# Patient Record
Sex: Female | Born: 1965 | Race: White | Hispanic: No | Marital: Married | Smoking: Never smoker
Health system: Southern US, Community
[De-identification: ages and names within clinical notes are randomized; demographics above are authoritative.]

## PROBLEM LIST (undated history)

## (undated) ENCOUNTER — Emergency Department (HOSPITAL_COMMUNITY): Discharge status: Absent without leave | Payer: BC Managed Care – PPO

## (undated) DIAGNOSIS — J45909 Unspecified asthma, uncomplicated: Secondary | ICD-10-CM

## (undated) DIAGNOSIS — F329 Major depressive disorder, single episode, unspecified: Secondary | ICD-10-CM

## (undated) DIAGNOSIS — Z87442 Personal history of urinary calculi: Secondary | ICD-10-CM

## (undated) DIAGNOSIS — E041 Nontoxic single thyroid nodule: Secondary | ICD-10-CM

## (undated) DIAGNOSIS — G43909 Migraine, unspecified, not intractable, without status migrainosus: Secondary | ICD-10-CM

## (undated) DIAGNOSIS — K219 Gastro-esophageal reflux disease without esophagitis: Secondary | ICD-10-CM

## (undated) DIAGNOSIS — F419 Anxiety disorder, unspecified: Secondary | ICD-10-CM

## (undated) DIAGNOSIS — R112 Nausea with vomiting, unspecified: Secondary | ICD-10-CM

## (undated) DIAGNOSIS — D320 Benign neoplasm of cerebral meninges: Secondary | ICD-10-CM

## (undated) DIAGNOSIS — Z9889 Other specified postprocedural states: Secondary | ICD-10-CM

## (undated) DIAGNOSIS — H9190 Unspecified hearing loss, unspecified ear: Secondary | ICD-10-CM

## (undated) DIAGNOSIS — J309 Allergic rhinitis, unspecified: Secondary | ICD-10-CM

## (undated) DIAGNOSIS — F32A Depression, unspecified: Secondary | ICD-10-CM

## (undated) DIAGNOSIS — D649 Anemia, unspecified: Secondary | ICD-10-CM

## (undated) HISTORY — DX: Gastro-esophageal reflux disease without esophagitis: K21.9

## (undated) HISTORY — DX: Nontoxic single thyroid nodule: E04.1

## (undated) HISTORY — DX: Personal history of urinary calculi: Z87.442

## (undated) HISTORY — DX: Benign neoplasm of cerebral meninges: D32.0

## (undated) HISTORY — PX: WISDOM TOOTH EXTRACTION: SHX21

## (undated) HISTORY — DX: Allergic rhinitis, unspecified: J30.9

## (undated) HISTORY — DX: Migraine, unspecified, not intractable, without status migrainosus: G43.909

## (undated) HISTORY — DX: Anemia, unspecified: D64.9

---

## 1998-05-12 ENCOUNTER — Other Ambulatory Visit: Admission: RE | Admit: 1998-05-12 | Discharge: 1998-05-12 | Payer: Self-pay | Admitting: Obstetrics and Gynecology

## 1998-08-05 ENCOUNTER — Inpatient Hospital Stay (HOSPITAL_COMMUNITY): Admission: AD | Admit: 1998-08-05 | Discharge: 1998-08-05 | Payer: Self-pay | Admitting: Obstetrics and Gynecology

## 1998-10-31 ENCOUNTER — Inpatient Hospital Stay (HOSPITAL_COMMUNITY): Admission: AD | Admit: 1998-10-31 | Discharge: 1998-10-31 | Payer: Self-pay | Admitting: Obstetrics and Gynecology

## 1998-11-04 ENCOUNTER — Inpatient Hospital Stay (HOSPITAL_COMMUNITY): Admission: AD | Admit: 1998-11-04 | Discharge: 1998-11-12 | Payer: Self-pay | Admitting: Obstetrics & Gynecology

## 1998-11-05 ENCOUNTER — Encounter: Payer: Self-pay | Admitting: Obstetrics and Gynecology

## 1998-11-05 ENCOUNTER — Encounter: Payer: Self-pay | Admitting: Obstetrics & Gynecology

## 1998-12-22 ENCOUNTER — Other Ambulatory Visit: Admission: RE | Admit: 1998-12-22 | Discharge: 1998-12-22 | Payer: Self-pay | Admitting: Obstetrics and Gynecology

## 2000-02-09 ENCOUNTER — Other Ambulatory Visit: Admission: RE | Admit: 2000-02-09 | Discharge: 2000-02-09 | Payer: Self-pay | Admitting: Obstetrics and Gynecology

## 2001-02-21 ENCOUNTER — Other Ambulatory Visit: Admission: RE | Admit: 2001-02-21 | Discharge: 2001-02-21 | Payer: Self-pay | Admitting: Obstetrics and Gynecology

## 2001-04-24 ENCOUNTER — Encounter: Payer: Self-pay | Admitting: Otolaryngology

## 2001-04-24 ENCOUNTER — Encounter: Admission: RE | Admit: 2001-04-24 | Discharge: 2001-04-24 | Payer: Self-pay | Admitting: Otolaryngology

## 2002-02-23 ENCOUNTER — Encounter: Payer: Self-pay | Admitting: Otolaryngology

## 2002-02-23 ENCOUNTER — Encounter: Admission: RE | Admit: 2002-02-23 | Discharge: 2002-02-23 | Payer: Self-pay | Admitting: Otolaryngology

## 2002-02-27 ENCOUNTER — Ambulatory Visit (HOSPITAL_COMMUNITY): Admission: RE | Admit: 2002-02-27 | Discharge: 2002-02-27 | Payer: Self-pay | Admitting: Otolaryngology

## 2002-02-27 ENCOUNTER — Encounter (INDEPENDENT_AMBULATORY_CARE_PROVIDER_SITE_OTHER): Payer: Self-pay | Admitting: Specialist

## 2002-02-27 ENCOUNTER — Encounter: Payer: Self-pay | Admitting: Otolaryngology

## 2002-03-12 ENCOUNTER — Other Ambulatory Visit: Admission: RE | Admit: 2002-03-12 | Discharge: 2002-03-12 | Payer: Self-pay | Admitting: Obstetrics and Gynecology

## 2002-07-20 ENCOUNTER — Encounter: Admission: RE | Admit: 2002-07-20 | Discharge: 2002-07-20 | Payer: Self-pay | Admitting: Otolaryngology

## 2002-07-20 ENCOUNTER — Encounter: Payer: Self-pay | Admitting: Otolaryngology

## 2002-10-29 ENCOUNTER — Ambulatory Visit (HOSPITAL_COMMUNITY): Admission: RE | Admit: 2002-10-29 | Discharge: 2002-10-29 | Payer: Self-pay | Admitting: *Deleted

## 2002-10-29 ENCOUNTER — Encounter: Payer: Self-pay | Admitting: *Deleted

## 2002-12-06 ENCOUNTER — Encounter: Payer: Self-pay | Admitting: Otolaryngology

## 2002-12-06 ENCOUNTER — Encounter: Admission: RE | Admit: 2002-12-06 | Discharge: 2002-12-06 | Payer: Self-pay | Admitting: Otolaryngology

## 2002-12-14 ENCOUNTER — Encounter: Payer: Self-pay | Admitting: Obstetrics and Gynecology

## 2002-12-14 ENCOUNTER — Inpatient Hospital Stay (HOSPITAL_COMMUNITY): Admission: AD | Admit: 2002-12-14 | Discharge: 2002-12-14 | Payer: Self-pay | Admitting: Obstetrics and Gynecology

## 2003-01-02 ENCOUNTER — Inpatient Hospital Stay (HOSPITAL_COMMUNITY): Admission: AD | Admit: 2003-01-02 | Discharge: 2003-01-06 | Payer: Self-pay | Admitting: Obstetrics and Gynecology

## 2003-02-19 ENCOUNTER — Other Ambulatory Visit: Admission: RE | Admit: 2003-02-19 | Discharge: 2003-02-19 | Payer: Self-pay | Admitting: Obstetrics and Gynecology

## 2004-01-27 ENCOUNTER — Encounter: Admission: RE | Admit: 2004-01-27 | Discharge: 2004-01-27 | Payer: Self-pay | Admitting: Otolaryngology

## 2004-05-05 ENCOUNTER — Other Ambulatory Visit: Admission: RE | Admit: 2004-05-05 | Discharge: 2004-05-05 | Payer: Self-pay | Admitting: Obstetrics and Gynecology

## 2005-03-12 ENCOUNTER — Encounter: Admission: RE | Admit: 2005-03-12 | Discharge: 2005-03-12 | Payer: Self-pay | Admitting: Otolaryngology

## 2005-05-21 ENCOUNTER — Other Ambulatory Visit: Admission: RE | Admit: 2005-05-21 | Discharge: 2005-05-21 | Payer: Self-pay | Admitting: Obstetrics and Gynecology

## 2006-02-14 ENCOUNTER — Encounter: Admission: RE | Admit: 2006-02-14 | Discharge: 2006-02-14 | Payer: Self-pay | Admitting: Otolaryngology

## 2006-04-21 ENCOUNTER — Encounter: Payer: Self-pay | Admitting: Internal Medicine

## 2006-09-02 ENCOUNTER — Encounter: Admission: RE | Admit: 2006-09-02 | Discharge: 2006-09-02 | Payer: Self-pay | Admitting: Otolaryngology

## 2006-09-11 ENCOUNTER — Encounter: Payer: Self-pay | Admitting: Internal Medicine

## 2006-12-29 ENCOUNTER — Encounter: Admission: RE | Admit: 2006-12-29 | Discharge: 2006-12-29 | Payer: Self-pay | Admitting: Otolaryngology

## 2007-06-25 ENCOUNTER — Emergency Department (HOSPITAL_COMMUNITY): Admission: EM | Admit: 2007-06-25 | Discharge: 2007-06-25 | Payer: Self-pay | Admitting: Emergency Medicine

## 2007-06-25 ENCOUNTER — Encounter: Payer: Self-pay | Admitting: Internal Medicine

## 2007-06-25 DIAGNOSIS — Z87442 Personal history of urinary calculi: Secondary | ICD-10-CM

## 2007-06-25 LAB — CONVERTED CEMR LAB
Glucose, Bld: 120 mg/dL
HCT: 43 %
Hemoglobin: 14.6 g/dL
Hgb urine dipstick: NEGATIVE
Ketones, urine, test strip: >80
Potassium: 4 meq/L
Sodium: 138 meq/L
Specific Gravity, Urine: 1.029
Urobilinogen, UA: 1
pH: 6

## 2007-06-29 ENCOUNTER — Encounter: Admission: RE | Admit: 2007-06-29 | Discharge: 2007-06-29 | Payer: Self-pay | Admitting: Otolaryngology

## 2008-12-02 ENCOUNTER — Encounter: Payer: Self-pay | Admitting: Internal Medicine

## 2008-12-10 ENCOUNTER — Encounter: Admission: RE | Admit: 2008-12-10 | Discharge: 2008-12-10 | Payer: Self-pay | Admitting: Otolaryngology

## 2009-05-16 LAB — CONVERTED CEMR LAB

## 2009-05-21 ENCOUNTER — Encounter: Admission: RE | Admit: 2009-05-21 | Discharge: 2009-05-21 | Payer: Self-pay | Admitting: Otolaryngology

## 2009-05-21 ENCOUNTER — Encounter: Payer: Self-pay | Admitting: Internal Medicine

## 2009-07-08 ENCOUNTER — Encounter: Payer: Self-pay | Admitting: Internal Medicine

## 2009-07-08 LAB — CONVERTED CEMR LAB
ALT: 7 units/L
AST: 10 units/L
Alkaline Phosphatase: 53 units/L
CO2: 22 meq/L
Calcium: 9.1 mg/dL
Glucose, Bld: 87 mg/dL
Sodium: 139 meq/L
TSH: 0.007 microintl units/mL
Total Bilirubin: 0.3 mg/dL
WBC: 4.5 10*3/uL

## 2009-07-15 ENCOUNTER — Encounter: Payer: Self-pay | Admitting: Internal Medicine

## 2009-07-15 DIAGNOSIS — J309 Allergic rhinitis, unspecified: Secondary | ICD-10-CM | POA: Insufficient documentation

## 2009-07-15 DIAGNOSIS — K219 Gastro-esophageal reflux disease without esophagitis: Secondary | ICD-10-CM

## 2009-07-16 ENCOUNTER — Ambulatory Visit: Payer: Self-pay | Admitting: Internal Medicine

## 2009-07-16 DIAGNOSIS — G43909 Migraine, unspecified, not intractable, without status migrainosus: Secondary | ICD-10-CM | POA: Insufficient documentation

## 2009-07-16 DIAGNOSIS — R519 Headache, unspecified: Secondary | ICD-10-CM | POA: Insufficient documentation

## 2009-07-16 DIAGNOSIS — E0590 Thyrotoxicosis, unspecified without thyrotoxic crisis or storm: Secondary | ICD-10-CM | POA: Insufficient documentation

## 2009-07-16 DIAGNOSIS — E041 Nontoxic single thyroid nodule: Secondary | ICD-10-CM | POA: Insufficient documentation

## 2009-07-16 DIAGNOSIS — R51 Headache: Secondary | ICD-10-CM

## 2009-07-17 ENCOUNTER — Telehealth (INDEPENDENT_AMBULATORY_CARE_PROVIDER_SITE_OTHER): Payer: Self-pay | Admitting: *Deleted

## 2009-07-18 ENCOUNTER — Encounter: Payer: Self-pay | Admitting: Internal Medicine

## 2009-07-25 ENCOUNTER — Encounter (INDEPENDENT_AMBULATORY_CARE_PROVIDER_SITE_OTHER): Payer: Self-pay | Admitting: Neurosurgery

## 2009-07-25 ENCOUNTER — Inpatient Hospital Stay (HOSPITAL_COMMUNITY): Admission: RE | Admit: 2009-07-25 | Discharge: 2009-07-29 | Payer: Self-pay | Admitting: Neurosurgery

## 2009-07-25 HISTORY — PX: OTHER SURGICAL HISTORY: SHX169

## 2009-08-04 ENCOUNTER — Encounter: Payer: Self-pay | Admitting: Internal Medicine

## 2009-08-05 ENCOUNTER — Ambulatory Visit: Payer: Self-pay | Admitting: Endocrinology

## 2009-08-05 DIAGNOSIS — D320 Benign neoplasm of cerebral meninges: Secondary | ICD-10-CM | POA: Insufficient documentation

## 2009-08-05 LAB — CONVERTED CEMR LAB
CO2: 30 meq/L (ref 19–32)
Chloride: 104 meq/L (ref 96–112)
Creatinine, Ser: 0.7 mg/dL (ref 0.4–1.2)
Free T4: 0.9 ng/dL (ref 0.6–1.6)
Potassium: 4.1 meq/L (ref 3.5–5.1)
Sodium: 141 meq/L (ref 135–145)
TSH: 0.73 microintl units/mL (ref 0.35–5.50)

## 2009-10-29 ENCOUNTER — Encounter: Payer: Self-pay | Admitting: Internal Medicine

## 2010-02-13 LAB — CONVERTED CEMR LAB

## 2010-02-13 LAB — HM MAMMOGRAPHY

## 2010-04-22 ENCOUNTER — Encounter: Payer: Self-pay | Admitting: Internal Medicine

## 2010-07-08 ENCOUNTER — Ambulatory Visit: Payer: Self-pay | Admitting: Internal Medicine

## 2010-07-11 LAB — CONVERTED CEMR LAB
AST: 15 units/L (ref 0–37)
Albumin: 4.1 g/dL (ref 3.5–5.2)
Alkaline Phosphatase: 49 units/L (ref 39–117)
Basophils Relative: 0.7 % (ref 0.0–3.0)
CO2: 29 meq/L (ref 19–32)
Calcium: 8.9 mg/dL (ref 8.4–10.5)
Direct LDL: 132.4 mg/dL
Glucose, Bld: 89 mg/dL (ref 70–99)
HCT: 39.8 % (ref 36.0–46.0)
Hemoglobin: 13.6 g/dL (ref 12.0–15.0)
Lymphocytes Relative: 28.6 % (ref 12.0–46.0)
Lymphs Abs: 1.9 10*3/uL (ref 0.7–4.0)
MCHC: 34.2 g/dL (ref 30.0–36.0)
Monocytes Relative: 6.8 % (ref 3.0–12.0)
Neutro Abs: 4.1 10*3/uL (ref 1.4–7.7)
Nitrite: NEGATIVE
Potassium: 4.3 meq/L (ref 3.5–5.1)
RBC: 4.39 M/uL (ref 3.87–5.11)
Sodium: 137 meq/L (ref 135–145)
Specific Gravity, Urine: 1.01 (ref 1.000–1.030)
TSH: 0.36 microintl units/mL (ref 0.35–5.50)
Total Protein, Urine: NEGATIVE mg/dL
Total Protein: 6.3 g/dL (ref 6.0–8.3)
Urine Glucose: NEGATIVE mg/dL
pH: 7.5 (ref 5.0–8.0)

## 2010-07-17 ENCOUNTER — Ambulatory Visit: Payer: Self-pay | Admitting: Internal Medicine

## 2010-07-17 ENCOUNTER — Encounter: Payer: Self-pay | Admitting: Internal Medicine

## 2010-08-20 ENCOUNTER — Ambulatory Visit
Admission: RE | Admit: 2010-08-20 | Discharge: 2010-08-20 | Payer: Self-pay | Source: Home / Self Care | Attending: Endocrinology | Admitting: Endocrinology

## 2010-08-26 ENCOUNTER — Telehealth: Payer: Self-pay | Admitting: Endocrinology

## 2010-09-05 ENCOUNTER — Other Ambulatory Visit: Payer: Self-pay | Admitting: Endocrinology

## 2010-09-05 DIAGNOSIS — E041 Nontoxic single thyroid nodule: Secondary | ICD-10-CM

## 2010-09-06 ENCOUNTER — Encounter: Payer: Self-pay | Admitting: Otolaryngology

## 2010-09-15 NOTE — Letter (Signed)
Summary: [facility]  [facility]   Imported By: Sherian Rein 08/22/2009 14:11:48  _____________________________________________________________________  External Attachment:    Type:   Image     Comment:   External Document

## 2010-09-15 NOTE — Assessment & Plan Note (Signed)
Summary: NEW/[employer]/#/CD   Vital Signs:  Patient profile:   45 year old female Height:      63 inches (160.02 cm) Weight:      110 pounds (50.00 kg) BMI:     19.56 O2 Sat:      99 % on Room air Temp:     99.1 degrees F (37.28 degrees C) oral Pulse rate:   94 / minute BP sitting:   102 / 68  (left arm) Cuff size:   regular  Vitals Entered By: Orlan Leavens RMA (July 17, 2010 3:20 PM)  O2 Flow:  Room air CC: cox Is Patient Diabetic? No Pain Assessment Patient in pain? no        Primary Care Provider:  Newt Lukes MD  CC:  cox.  History of Present Illness: patient is here today for annual physical. Patient feels well and has no complaints.   Preventive Screening-Counseling & Management  Alcohol-Tobacco     Alcohol drinks/day: 0     Alcohol Counseling: not indicated; patient does not drink     Smoking Status: never     Tobacco Counseling: not indicated; no tobacco use  Caffeine-Diet-Exercise     Caffeine Counseling: decrease use of caffeine     Does Patient Exercise: no  Safety-Violence-Falls     Seat Belt Counseling: not indicated; patient wears seat belts     Violence Counseling: not indicated; no violence risk noted  Clinical Review Panels:  Prevention   Last Mammogram:  No specific mammographic evidence of malignancy.   (02/13/2010)   Last Pap Smear:  Interpretation Result:Negative for intraepithelial Lesion or Malignancy.    (02/13/2010)  Lipid Management   Cholesterol:  202 (07/08/2010)   HDL (good cholesterol):  49.60 (07/08/2010)  CBC   WBC:  6.8 (07/08/2010)   RBC:  4.39 (07/08/2010)   Hgb:  13.6 (07/08/2010)   Hct:  39.8 (07/08/2010)   Platelets:  276.0 (07/08/2010)   MCV  90.5 (07/08/2010)   MCHC  34.2 (07/08/2010)   RDW  12.3 (07/08/2010)   PMN:  60.0 (07/08/2010)   Lymphs:  28.6 (07/08/2010)   Monos:  6.8 (07/08/2010)   Eosinophils:  3.9 (07/08/2010)   Basophil:  0.7 (07/08/2010)  Complete Metabolic Panel   Glucose:  89  (07/08/2010)   Sodium:  137 (07/08/2010)   Potassium:  4.3 (07/08/2010)   Chloride:  103 (07/08/2010)   CO2:  29 (07/08/2010)   BUN:  12 (07/08/2010)   Creatinine:  0.6 (07/08/2010)   Albumin:  4.1 (07/08/2010)   Total Protein:  6.3 (07/08/2010)   Calcium:  8.9 (07/08/2010)   Total Bili:  0.6 (07/08/2010)   Alk Phos:  49 (07/08/2010)   SGPT (ALT):  12 (07/08/2010)   SGOT (AST):  15 (07/08/2010)   Current Medications (verified): 1)  Claritin 10 Mg Tabs (Loratadine) .... Take 1 By Mouth Once Daily Prn 2)  Levothyroxine Sodium 50 Mcg Tabs (Levothyroxine Sodium) .Marland Kitchen.. 1 Qd  Allergies (verified): 1)  ! Levaquin  Past History:  Past medical, surgical, family and social histories (including risk factors) reviewed, and no changes noted (except as noted below).  Past Medical History: Allergic rhinitis GERD Hypothyroidism? thyroid nodule  MD roster - ENT-[facility] neuro-Yan gyn- [facility] N[facility] - stern endo - ellison  Past Surgical History: Caesarean section x's 2 meninogioma rescetion 07/25/2009  Family History: Reviewed history from 07/16/2009 and no changes required. Family History Diabetes 1st degree relative (grandparent) Family History High cholesterol (parent) Stoke (grandparent) Family hx  of ulcerative colitis  Social History: Reviewed history from 07/16/2009 and no changes required. Never Smoked no alcohol married, lives with spouse and 2 kids pt works as Runner, broadcasting/film/video  Review of Systems       see HPI above. I have reviewed all other systems and they were negative.   Physical Exam  General:  thin, alert, well-developed, well-nourished, and cooperative to examination.    Head:  Normocephalic and atraumatic without obvious abnormalities. No apparent alopecia or balding. Eyes:  vision grossly intact; pupils equal, round and reactive to light.  conjunctiva and lids normal.    Ears:  normal pinnae bilaterally, without erythema, swelling, or tenderness to palpation. TMs  clear, without effusion, or cerumen impaction. Hearing grossly normal bilaterally  Mouth:  teeth and gums in good repair; mucous membranes moist, without lesions or ulcers. oropharynx clear without exudate, no erythema.  Neck:  No deformities, masses, or tenderness noted. Lungs:  normal respiratory effort, no intercostal retractions or use of accessory muscles; normal breath sounds bilaterally - no crackles and no wheezes.    Heart:  normal rate, regular rhythm, no murmur, and no rub. BLE without edema. Abdomen:  soft, non-tender, normal bowel sounds, no distention; no masses and no appreciable hepatomegaly or splenomegaly.   Genitalia:  defer to gyn Msk:  No deformity or scoliosis noted of thoracic or lumbar spine.   Neurologic:  alert & oriented X3 and cranial nerves II-XII symetrically intact.  strength normal in all extremities, sensation intact to light touch, and gait normal. speech fluent without dysarthria or aphasia; follows commands with good comprehension.  Skin:  no rashes, vesicles, ulcers, or erythema. No nodules or irregularity to palpation.  Psych:  Oriented X3, memory intact for recent and remote, normally interactive, good eye contact, not anxious appearing, not depressed appearing, and not agitated.      Impression & Recommendations:  Problem # 1:  PREVENTIVE HEALTH CARE (ICD-V70.0) Patient has been counseled on age-appropriate routine health concerns for screening and prevention. These are reviewed and up-to-date. Immunizations are up-to-date or declined. Labs and ECG reviewed.  Orders: EKG w/ Interpretation (93000)  Complete Medication List: 1)  Claritin 10 Mg Tabs (Loratadine) .... Take 1 by mouth once daily prn 2)  Levothyroxine Sodium 50 Mcg Tabs (Levothyroxine sodium) .Marland Kitchen.. 1 qd  Patient Instructions: 1)  it was good to see you today. 2)  exam, labs and EKG look good 3)  Recommend Vitamin D 1000-2000 units daily -  4)  no other medications changes 5)  Please  schedule a follow-up appointment in annually for your medical physical and labs, call sooner if problems.    Orders Added: 1)  EKG w/ Interpretation [93000] 2)  Est. Patient 40-64 years [99396]     Mammogram  Procedure date:  02/13/2010  Findings:      No specific mammographic evidence of malignancy.    Pap Smear  Procedure date:  02/13/2010  Findings:      Interpretation Result:Negative for intraepithelial Lesion or Malignancy.

## 2010-09-15 NOTE — Letter (Signed)
Summary: [facility]  [facility]   Imported By: Sherian Rein 08/22/2009 14:12:58  _____________________________________________________________________  External Attachment:    Type:   Image     Comment:   External Document

## 2010-09-15 NOTE — Letter (Signed)
Summary: [facility]  [facility]   Imported By: Sherian Rein 08/22/2009 14:10:25  _____________________________________________________________________  External Attachment:    Type:   Image     Comment:   External Document

## 2010-09-15 NOTE — Letter (Signed)
Summary: [facility]  [facility]   Imported By: Sherian Rein 08/22/2009 14:15:06  _____________________________________________________________________  External Attachment:    Type:   Image     Comment:   External Document

## 2010-09-15 NOTE — Letter (Signed)
Summary: [facility]  [facility]   Imported By: Lester [place] 11/27/2009 11:25:13  _____________________________________________________________________  External Attachment:    Type:   Image     Comment:   External Document

## 2010-09-15 NOTE — Letter (Signed)
Summary: [facility]  [facility]   Imported By: Lester [place] 05/12/2010 12:44:50  _____________________________________________________________________  External Attachment:    Type:   Image     Comment:   External Document

## 2010-09-17 NOTE — Progress Notes (Signed)
Summary: DX CODE  ---- Converted from flag ---- ---- 08/24/2010 7:04 PM, Minus Breeding MD wrote: 241.0  ---- 08/24/2010 4:20 PM, Hilarie Fredrickson wrote: What is the dx code for the tsh? ------------------------------

## 2010-09-17 NOTE — Assessment & Plan Note (Signed)
Summary: F/U APPT/CD   Vital Signs:  Patient profile:   45 year old female Height:      63 inches (160.02 cm) Weight:      109.38 pounds (49.72 kg) BMI:     19.45 O2 Sat:      98 % on Room air Temp:     99.1 degrees F (37.28 degrees C) oral Pulse rate:   80 / minute Pulse rhythm:   regular BP sitting:   106 / 74  (left arm) Cuff size:   regular  Vitals Entered By: Brenton Grills CMA Duncan Dull) (August 20, 2010 4:03 PM)  O2 Flow:  Room air CC: Follow up on thyroid/aj Is Patient Diabetic? No   Referring Provider:  leschber Primary Provider:  Newt Lukes MD  CC:  Follow up on thyroid/aj.  History of Present Illness: pt says she feels slightly lightheaded recently.  several children were ill a few mos ago, but they are better now.  she says the temp of 99.1 is high for her.  she has regular menses.  she does not appreciate any change in the thyroid nodule.  pt says she was never noted to have hypothyroidism--the synthroid was only for suppression of the nodule.  Current Medications (verified): 1)  Claritin 10 Mg Tabs (Loratadine) .... Take 1 By Mouth Once Daily Prn 2)  Levothyroxine Sodium 50 Mcg Tabs (Levothyroxine Sodium) .Marland Kitchen.. 1 Qd 3)  Vitamin D 400 Unit Caps (Cholecalciferol) .Marland Kitchen.. 1 Capsule By Mouth Once Daily 4)  Multivitamins  Caps (Multiple Vitamin) .Marland Kitchen.. 1 By Mouth Once Daily  Allergies (verified): 1)  ! Levaquin  Past History:  Past Medical History: Last updated: 07/17/2010 Allergic rhinitis GERD Hypothyroidism? thyroid nodule  MD roster - ENT-[facility] neuro-Yan gyn- [facility] - stern endo - Naava Janeway  Review of Systems       denies sore throat/earache/dysuria/dysuria/abd pain  Physical Exam  General:  normal appearance.   Neck:  there is an approx 2 cm left lower pole thyroid nodule.   Impression & Recommendations:  Problem # 1:  THYROID NODULE (ICD-241.0) Assessment Unchanged  Problem # 2:  other general sxs this is not  fever  Medications Added to Medication List This Visit: 1)  Vitamin D 400 Unit Caps (Cholecalciferol) .Marland Kitchen.. 1 capsule by mouth once daily 2)  Multivitamins Caps (Multiple vitamin) .Marland Kitchen.. 1 by mouth once daily  Other Orders: Radiology Referral (Radiology) Est. Patient Level III (16109)  Patient Instructions: 1)  stop levothyroxine, on a trial basis. 2)  in 2 months, go to lab for tsh, and check ultrasound then.  you will be called with a day and time for an appointment. 3)  Please schedule a follow-up appointment in 1 year.   Orders Added: 1)  Radiology Referral [Radiology] 2)  Est. Patient Level III [60454]

## 2010-10-23 ENCOUNTER — Other Ambulatory Visit: Payer: Self-pay

## 2010-11-09 ENCOUNTER — Ambulatory Visit
Admission: RE | Admit: 2010-11-09 | Discharge: 2010-11-09 | Disposition: A | Discharge status: Home / Self Care | Payer: Self-pay | Source: Ambulatory Visit | Attending: Endocrinology | Admitting: Endocrinology

## 2010-11-09 DIAGNOSIS — E041 Nontoxic single thyroid nodule: Secondary | ICD-10-CM

## 2010-11-17 LAB — ABO/RH: ABO/RH(D): O POS

## 2010-11-17 LAB — CBC
Hemoglobin: 16.1 g/dL — ABNORMAL HIGH (ref 12.0–15.0)
RBC: 5.39 MIL/uL — ABNORMAL HIGH (ref 3.87–5.11)
RDW: 13.4 % (ref 11.5–15.5)
WBC: 11 10*3/uL — ABNORMAL HIGH (ref 4.0–10.5)

## 2010-11-17 LAB — BASIC METABOLIC PANEL
BUN: 20 mg/dL (ref 6–23)
GFR calc non Af Amer: >60 mL/min (ref >60)
Potassium: 4.5 mEq/L (ref 3.5–5.1)
Sodium: 137 mEq/L (ref 135–145)

## 2010-11-17 LAB — TYPE AND SCREEN: Antibody Screen: NEGATIVE

## 2010-12-02 ENCOUNTER — Telehealth: Payer: Self-pay

## 2010-12-02 NOTE — Telephone Encounter (Signed)
Pt called requesting results of thryoid U/S

## 2010-12-02 NOTE — Telephone Encounter (Signed)
Pt advised.

## 2010-12-02 NOTE — Telephone Encounter (Signed)
On phone-tree 

## 2011-01-01 NOTE — Op Note (Signed)
NAME:  Deborah Ortega, Deborah Ortega                         ACCOUNT NO.:  [id number]   MEDICAL RECORD NO.:  [id number]                   PATIENT TYPE:  INP   LOCATION:  9104                                 FACILITY:  [facility]   PHYSICIAN:  Juluis Mire, M.D.                DATE OF BIRTH:  04/30/1966   DATE OF PROCEDURE:  01/02/2003  DATE OF DISCHARGE:                                 OPERATIVE REPORT   PREOPERATIVE DIAGNOSES:  1. Intrauterine pregnancy at 38 weeks with prior cesarean section, desires a     repeat.  2. Maternal elevated bile acids.   POSTOPERATIVE DIAGNOSES:  1. Intrauterine pregnancy at 38 weeks with prior cesarean section, desires a     repeat.  2. Maternal elevated bile acids.   PROCEDURE:  Low transverse cesarean section.   SURGEON:  Juluis Mire, M.D.   ANESTHESIA:  Spinal.   ESTIMATED BLOOD LOSS:  600 mL.   PACKS AND DRAINS:  None.   BLOOD REPLACED:  None.   COMPLICATIONS:  None.   INDICATIONS:  Dictated in the history and physical.   DESCRIPTION OF PROCEDURE:  The patient was taken to the operating room and  placed in the supine position with a left lateral tilt.  After a  satisfactory level of spinal anesthesia was obtained, the abdomen was  prepped out with Betadine and draped as a sterile field.  A low transverse  skin incision was identified and excised.  The incision was then extended  through subcutaneous tissue.  The anterior rectus fascia was entered sharply  and the incision in the fascia extended laterally.  The fascia taken off the  muscle superiorly inferiorly.  The rectus muscles were separated in the  midline.  The anterior peritoneum was entered sharply and the incision in  the peritoneum extended both superiorly and inferiorly.  A low transverse  bladder flap was developed and a low transverse uterine incision begun with  a knife and extended laterally using manual traction.  Amniotic fluid was  clear.  The infant was in the vertex  presentation, delivered with elevation  of the head and fundal pressure.  The infant was a viable female who weighed 6  pounds, Apgars were 8 and 9, umbilical artery pH was 7.31.  The placenta was  then delivered manually.  The uterus was wiped free of remaining membranes  and placenta.  The uterus was then closed in a running interlocking suture  of 0 chromic using a two-layer closure technique.  Areas of bleeding brought  under control with figure-of-eights of 0 chromic.  The tubes and ovaries  were visualized, noted to be unremarkable.  We irrigated the pelvis.  Hemostasis was excellent.  Urine output was clear.  At this point in time  muscle reapproximated with a running suture of 3-0 Vicryl, fascia closed in  a running suture of 0 PDS.  Skin was closed with  staples and Steri-  Strips.  Sponge, instrument, and needle count reported as correct by the  circulating nurse x2.  The Foley catheter remained clear at the time of  closure.  The patient tolerated the procedure well and was returned to the  recovery room in good condition.                                               Juluis Mire, M.D.    JSM/MEDQ  D:  01/02/2003  T:  01/03/2003  Job:  [id number]

## 2011-01-01 NOTE — Discharge Summary (Signed)
NAME:  Deborah Ortega, Deborah Ortega                         ACCOUNT NO.:  [id number]   MEDICAL RECORD NO.:  [id number]                   PATIENT TYPE:  INP   LOCATION:  9104                                 FACILITY:  [facility]   PHYSICIAN:  Michelle L. Vincente Poli, M.D.            DATE OF BIRTH:  [birth year]   DATE OF ADMISSION:  01/02/2003  DATE OF DISCHARGE:  01/06/2003                                 DISCHARGE SUMMARY   ADMISSION DIAGNOSES:  1. Intrauterine pregnancy at 11 weeks estimated gestational age.  2. Previous cesarean delivery, desires repeat.   DISCHARGE DIAGNOSIS:  Status post low-transverse cesarean section to a  viable female infant.   PROCEDURE:  Repeat low-transverse cesarean section.   REASON FOR ADMISSION:  Please see dictated H&P.   HOSPITAL COURSE:  Patient is a 45 year old white, married female, gravida 2,  para 1, who was admitted to [facility] for a scheduled  repeat cesarean delivery.  Patient had had a previous cesarean delivery for  failure to progress.  Patient declined trial of labor and requested a repeat  cesarean.  On the morning of admission, patient was taken to the operating  room where spinal anesthesia was administered without difficulty.  A low-  transverse incision was made with delivery of a viable female infant weighing  6 pounds 0 ounces with Apgars of 8 at 1 minute and 9 at 5 minutes.  Patient  tolerated the procedure well and was taken to the recovery room in stable  condition.   On postoperative day one, patient complained of a sore throat.  Vital signs  were stable; she was afebrile.  No cervical lymphadenopathy was noted.  Throat was slightly erythematous without exudate.  Abdomen soft.  She had  good return of bowel function.  Fundus was firm and nontender.  Abdominal  dressing was noted to have a small amount of old drainage on bandage.  Labs  revealed hemoglobin of 9.1, platelet count of 186,000, WBC count of 9.4.  Patient was restarted  on her Synthroid.   On postoperative day two, the patient was without complaint; vital signs  were stable.  Abdomen was soft.  Fundus was firm and nontender.  Abdominal  dressing was removed, revealing an incision that was clean, dry, and intact.   On postoperative day three, patient was without complaint.  Fundus was firm  and nontender.  Incision was clean, dry, and intact.  She was ambulating  well and tolerating a regular diet without complaints of nausea and  vomiting.   On postoperative day four, patient was doing well; she was ambulating  without complaint.  Vital signs were stable.  Fundus was firm and nontender.  Discharge instructions were reviewed, and the patient was discharged home.   CONDITION ON DISCHARGE:  Good.   DIET:  Regular, as tolerated.   ACTIVITY:  No heavy lifting.  No driving times two weeks.  No vaginal entry.  FOLLOW UP:  Patient is to follow up in the office in one to two weeks for an  incision check.  She is to call for temperature greater than 100 degrees,  persistent nausea and vomiting, heavy vaginal bleeding, and/or redness or  drainage of incisional site.   DISCHARGE MEDICATIONS:  1. Synthroid 0.1 mg daily.  2. Motrin 600 mg every six hours.  3. Percocet 5/325 mg, number 30, one p.o. every four to six hours p.r.n.     pain.  4. Prenatal vitamins one p.o. daily.  5. Colace one p.o. daily p.r.n.     Julio Sicks, N.P.                        Stann Mainland. Vincente Poli, M.D.    CC/MEDQ  D:  01/29/2003  T:  01/29/2003  Job:  [id number]

## 2011-01-01 NOTE — H&P (Signed)
NAME:  Deborah Ortega, Deborah Ortega                         ACCOUNT NO.:  [id number]   MEDICAL RECORD NO.:  [id number]                   PATIENT TYPE:  INP   LOCATION:  NA                                   FACILITY:  [facility]   PHYSICIAN:  Juluis Mire, M.D.                DATE OF BIRTH:  05-21-1966   DATE OF ADMISSION:  12/02/2002  DATE OF DISCHARGE:                                HISTORY & PHYSICAL   HISTORY OF PRESENT ILLNESS:  The patient is a 45 year old, G2, P0-1-0-1,  married white female.  Last menstrual period 04/13/03, giving her an  estimated date of confinement of 01/16/02, and estimated gestational age of [redacted]  weeks.  This is consistent with initial evaluation prior ultrasound.  She  presents for repeat cesarean section.   In relation to the present admission, the patient had a low transverse  cesarean section done for failure to progress.  We discussed a trial of  labor.  This was declined by the patient.  She wished to proceed with repeat  cesarean section for which she was admitted at the present time.   She is also at risk for advanced maternal age.  Declined genetic evaluation  or any type of Quad screening to evaluate the status.  The ultrasound  evaluation was normal.   Prior pregnancy was complicated by decreased amniotic fluid.  The exact  cause of the decreased amniotic fluid was undetermined.  We did obtain an  ANA, lupus anticoagulant, anticardiolipin, all of which were negative. We  have watched her with serial ultrasounds.   Another condition that she did develop was diffuse pruritus.  Subsequent  serum bile acids were elevated.  The remainder of the liver function tests  were normal.  We treated her with topical steroids and cholestyramine with  fairly good results.  Because of the increased risk to the fetus with  elevated bile acids, we watched her with biweekly nonstress test and  biophysical profiles.  She was noted on one visit to have an amniotic fluid  index of 8  cm which was the 6th percentile.  They did do an amniocentesis on  12/14/02.  Unfortunately, LS was immature at 1.3 to 1.  Follow up ultrasounds  have revealed better volumes in terms of amniotic fluid.  The last one  obtained revealed an amniotic fluid of 14.6 which was the 50th percentile.  Fetal weight was in the 20th percentile.  We are going to proceed with  repeat cesarean section without repeating the amniocentesis at 38 weeks.  Her group B strep is negative.   ALLERGIES:  1. SUDAFED.  2. CODEINE.   MEDICATIONS:  Prenatal vitamins.   PRENATAL LABS:  The patient is 0+.  Negative antibody screen.  Nonreactive  serology.  Positive rubella titer.  Negative hepatitis B surface antigen.  Her 50 gm glucola is within normal limits.   PAST MEDICAL HISTORY /  FAMILY HISTORY / SOCIAL HISTORY:  Please see prenatal  records.   REVIEW OF SYSTEMS:  Noncontributory.   PHYSICAL EXAMINATION:  VITAL SIGNS:  The patient's blood pressure is 100/56.  Other vital signs are stable.  HEENT:  The patient is normocephalic.  Pupils equal, round and reactive to  light and accommodation.  Extraocular movements were intact.  Sclerae and  conjunctivae clear.  Oropharynx clear.  NECK:  Without thyromegaly.  BREASTS:  Glandular, but no discrete masses.  LUNGS:  Clear.  CARDIOVASCULAR:  Regular rate and rhythm with a grade 2/6 systolic ejection  murmur.  No clicks or gallops.  ABDOMEN:  Gravid uterus consistent with dates.  PELVIC:  Deferred.  EXTREMITIES:  Trace edema.  NEUROLOGIC:  Within normal limits.  Deep tendon reflexes 2+.  No clonus.   IMPRESSION:  1. Intrauterine pregnancy at 38 weeks with prior cesarean sections.  Desires     repeat.  2. Elevated maternal serum bile acids.  3. Advanced maternal age with no genetic evaluation per patient.  4. Past history of infant with oligohydramnios.   PLAN:  The patient will undergo repeat cesarean section.  The risks have  been discussed including the  risk of infection, the risk of hemorrhage which  could necessitate transfusions, with the risk of AIDS or hepatitis, risk of  injury to adjacent organs including bladder, bowel, or ureter which could  require full exploratory surgery, risk of deep vein thrombosis and pulmonary  embolus.  The patient voiced her understanding of indication and risks.                                               Juluis Mire, M.D.    JSM/MEDQ  D:  01/02/2003  T:  01/02/2003  Job:  [id number]

## 2011-03-25 ENCOUNTER — Other Ambulatory Visit: Payer: Self-pay | Admitting: Obstetrics and Gynecology

## 2011-03-25 DIAGNOSIS — R928 Other abnormal and inconclusive findings on diagnostic imaging of breast: Secondary | ICD-10-CM

## 2011-03-31 ENCOUNTER — Ambulatory Visit
Admission: RE | Admit: 2011-03-31 | Discharge: 2011-03-31 | Disposition: A | Discharge status: Home / Self Care | Payer: 59 | Source: Ambulatory Visit | Attending: Obstetrics and Gynecology | Admitting: Obstetrics and Gynecology

## 2011-03-31 DIAGNOSIS — R928 Other abnormal and inconclusive findings on diagnostic imaging of breast: Secondary | ICD-10-CM

## 2011-05-25 LAB — POCT PREGNANCY, URINE: Operator id: 294511

## 2011-05-25 LAB — URINALYSIS, ROUTINE W REFLEX MICROSCOPIC
Glucose, UA: NEGATIVE
Ketones, ur: >80 — AB
Leukocytes, UA: NEGATIVE
Protein, ur: 30 — AB

## 2011-05-25 LAB — I-STAT 8, (EC8 V) (CONVERTED LAB)
BUN: 16
Glucose, Bld: 120 — ABNORMAL HIGH
Potassium: 4
Sodium: 138
TCO2: 21
pH, Ven: 7.551 — ABNORMAL HIGH

## 2011-05-25 LAB — POCT I-STAT CREATININE
Creatinine, Ser: 0.8
Operator id: 294511

## 2011-05-25 LAB — URINE MICROSCOPIC-ADD ON

## 2011-08-02 ENCOUNTER — Telehealth: Payer: Self-pay

## 2011-08-02 NOTE — Telephone Encounter (Signed)
Noted agree

## 2011-08-02 NOTE — Telephone Encounter (Signed)
[facility] Triage Call Report Triage Record Num: [id number] Operator: Reino Bellis Patient Name: Deborah Ortega Call Date & Time: 08/01/2011 1:23:43PM Patient Phone: [phone] PCP: Rene Paci Patient Gender: Female PCP Fax : [phone] Patient DOB: 08-Jun-1966 [facility] Name: [facility] Reason for Call: Caller: Deborah Ortega/Patient; PCP: Rene Paci; CB#: [phone]; Call Reason: Flu Sx; Sx Onset: 07/31/2011; Sx Notes: ; Temp:101.0 Oral at 11:00; Wt: ; Home treatment(s) tried: Acetaminophen, nasal spray; Did home treatment help?: No; Guideline Used: ; Disp:; Appt Scheduled?: Pt. calling about fever (101-102 with medications), nonproductive cough, body aches, stuffy nose. Onset: 07/31/11. Last temp: 102 (O) @ ~ 1300. Took 2 Aleve @ 1100. All emergent sxs r/o per Flu-Like Symptoms except temperature of 101.5 or greater that has not responded to 24 hours of home care measures. Protocol(s) Used: Flu-Like Symptoms Recommended Outcome per Protocol: See Provider within 24 hours Reason for Outcome: Temperature of 101.5 F (38.6 C) or greater that has not responded to 24 hours of home care measures Care Advice: ~ Rest until symptoms improve. ~ If you can, stop smoking now and avoid all secondhand smoke. ~ SYMPTOM / CONDITION MANAGEMENT ~ INFECTION CONTROL Most adults need to drink 6-10 eight-ounce glasses (1.2-2.0 liters) of fluids per day unless previously told to limit fluid intake for other medical reasons. Limit fluids that contain caffeine, sugar or alcohol. Urine will be a very light yellow color when you drink enough fluids. ~ ~ Call EMS 911 if person has a change in level of consciousness. Analgesic/Antipyretic Advice - Acetaminophen: Consider acetaminophen as directed on label or by pharmacist/provider for pain or fever. PRECAUTIONS: - Use only if there is no history of liver disease, alcoholism, or intake of three or more alcohol drinks per day. - If approved by  provider when breastfeeding. - Do not exceed recommended dose or frequency. ~ See a provider immediately or go to the Emergency Department if having chest pain with breathing, change in level of consciousness, new seizure, vomiting and unable to keep fluids down for 8 hours or more, or has not urinated for 8 or more hours. ~ Influenza - Expected Course: - Symptoms start to improve in 3 to 7 days. - Cough and feeling tired (malaise) may continue for several weeks. - Cough and extreme fatigue lasting more than 3 weeks needs medical evaluation. - Remain at home at least 24 hours after being free of fever 100F (37.8C) without the use of fever reducing medication. ~ Influenza - Transmission: - Flu virus is spread through the air in droplets when a flu-infected person coughs, sneezes or talks and another person breathes in the droplets, or by touching a surface like a door knob, telephone, or keyboard that has been contaminated by the droplets and then touching the mouth, nose, or eyes. ~ 08/01/2011 1:42:41PM Page 1 of 2 [id number] [facility] Triage Call Report Patient Name: Deborah Ortega continuation page/s - An individual may be passing on the flu before they have any symptoms. - An individual may continue to be contagious with the flu for up to 7 days after the symptoms start. H1N1 influenza may continue to be contagious as long as cough persists. Do not go to work, school, or any community activity until you have not had a fever (100F or 37.8C) for at least 24 hours without taking fever-reducing medication. This means staying at home for at least 3 to 5, possibly 7 days. ~ 12/

## 2011-12-04 IMAGING — CR DG CHEST 1V PORT
1 series · 1 of 1 positions shown · non-contrast
Comparison: None

CLINICAL DATA: Central line placement, brain tumor

PORTABLE CHEST - 1 VIEW

[AP]
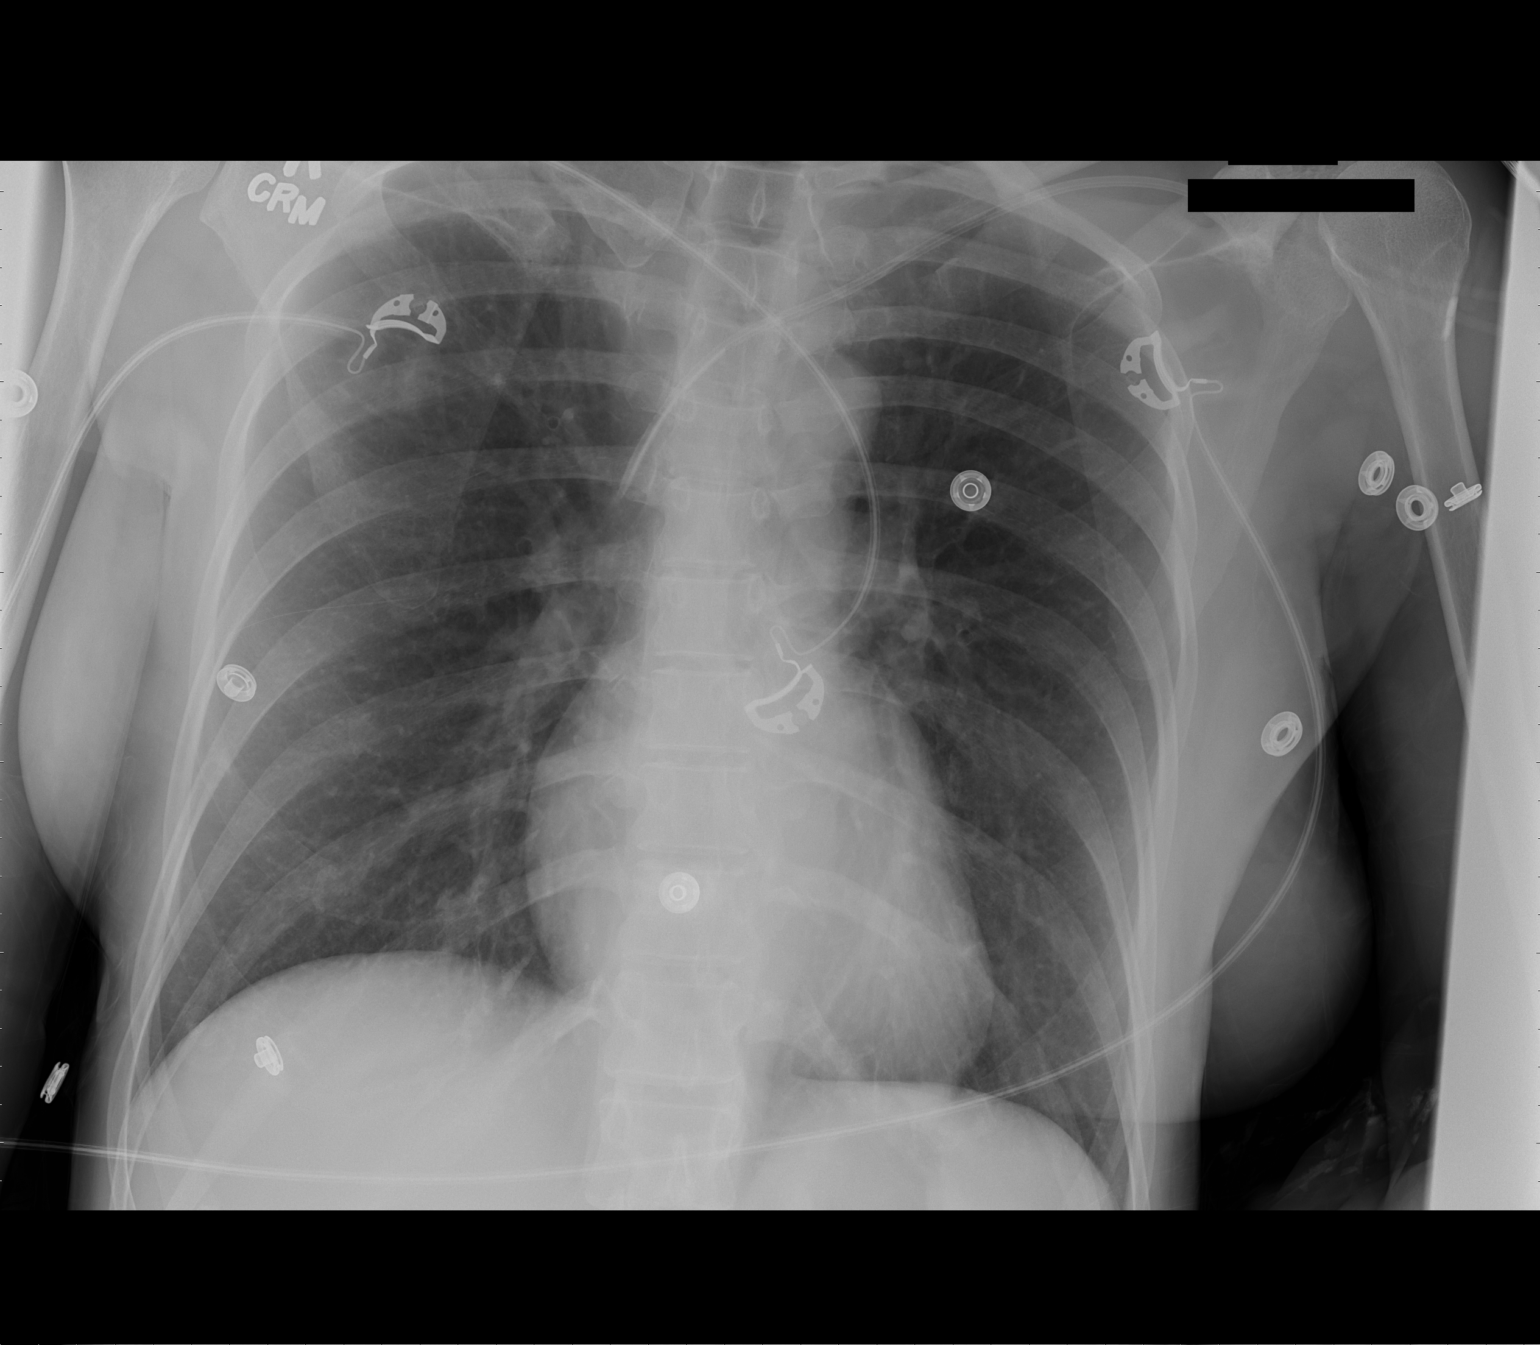

[1 of 1 positions shown; findings below may reference images not displayed]

FINDINGS: A left central line is present with the tip in the mid
upper SVC.  No pneumothorax is seen.  The lungs are clear.
Mediastinal contours are normal.  The heart is within normal limits
in size.
IMPRESSION: Left central venous catheter tip in mid upper SVC.  No
pneumothorax.

## 2011-12-17 ENCOUNTER — Encounter: Payer: Self-pay | Admitting: Internal Medicine

## 2012-02-08 ENCOUNTER — Other Ambulatory Visit (INDEPENDENT_AMBULATORY_CARE_PROVIDER_SITE_OTHER): Payer: 59

## 2012-02-08 ENCOUNTER — Telehealth: Payer: Self-pay | Admitting: *Deleted

## 2012-02-08 DIAGNOSIS — Z0000 Encounter for general adult medical examination without abnormal findings: Secondary | ICD-10-CM

## 2012-02-08 LAB — URINALYSIS, ROUTINE W REFLEX MICROSCOPIC
Bilirubin Urine: NEGATIVE
Hgb urine dipstick: NEGATIVE
Ketones, ur: NEGATIVE
Nitrite: NEGATIVE
Total Protein, Urine: NEGATIVE
Urine Glucose: NEGATIVE
pH: 7 (ref 5.0–8.0)

## 2012-02-08 LAB — CBC WITH DIFFERENTIAL/PLATELET
Basophils Absolute: 0 10*3/uL (ref 0.0–0.1)
Eosinophils Relative: 3.2 % (ref 0.0–5.0)
HCT: 41.4 % (ref 36.0–46.0)
Hemoglobin: 13.5 g/dL (ref 12.0–15.0)
Lymphs Abs: 2.1 10*3/uL (ref 0.7–4.0)
MCV: 87.2 fl (ref 78.0–100.0)
Monocytes Absolute: 0.5 10*3/uL (ref 0.1–1.0)
Monocytes Relative: 6.5 % (ref 3.0–12.0)
Neutro Abs: 4.2 10*3/uL (ref 1.4–7.7)
RDW: 13.7 % (ref 11.5–14.6)

## 2012-02-08 LAB — BASIC METABOLIC PANEL
CO2: 27 mEq/L (ref 19–32)
Calcium: 9.2 mg/dL (ref 8.4–10.5)
Creatinine, Ser: 0.7 mg/dL (ref 0.4–1.2)
GFR: 99.81 mL/min (ref >60.00)
Glucose, Bld: 77 mg/dL (ref 70–99)

## 2012-02-08 LAB — LIPID PANEL
Cholesterol: 208 mg/dL — ABNORMAL HIGH (ref 0–200)
HDL: 58.2 mg/dL (ref >39.00)
Triglycerides: 60 mg/dL (ref 0.0–149.0)
VLDL: 12 mg/dL (ref 0.0–40.0)

## 2012-02-08 LAB — HEPATIC FUNCTION PANEL
AST: 18 U/L (ref 0–37)
Albumin: 4.2 g/dL (ref 3.5–5.2)
Alkaline Phosphatase: 51 U/L (ref 39–117)
Total Bilirubin: 0.7 mg/dL (ref 0.3–1.2)

## 2012-02-08 NOTE — Telephone Encounter (Signed)
Pt is in the lab for blood work no orders. Have cpx schedule for next week need labs entered... 02/08/12@12 :08pm/LMB

## 2012-02-14 ENCOUNTER — Encounter: Payer: Self-pay | Admitting: Internal Medicine

## 2012-02-14 ENCOUNTER — Telehealth: Payer: Self-pay | Admitting: *Deleted

## 2012-02-14 ENCOUNTER — Ambulatory Visit (INDEPENDENT_AMBULATORY_CARE_PROVIDER_SITE_OTHER): Payer: 59 | Admitting: Internal Medicine

## 2012-02-14 VITALS — BP 98/72 | HR 72 | Temp 98.3°F | Ht 63.5 in | Wt 109.8 lb

## 2012-02-14 DIAGNOSIS — M7652 Patellar tendinitis, left knee: Secondary | ICD-10-CM

## 2012-02-14 DIAGNOSIS — Z0000 Encounter for general adult medical examination without abnormal findings: Secondary | ICD-10-CM

## 2012-02-14 DIAGNOSIS — E041 Nontoxic single thyroid nodule: Secondary | ICD-10-CM

## 2012-02-14 DIAGNOSIS — M7650 Patellar tendinitis, unspecified knee: Secondary | ICD-10-CM

## 2012-02-14 DIAGNOSIS — E039 Hypothyroidism, unspecified: Secondary | ICD-10-CM | POA: Insufficient documentation

## 2012-02-14 MED ORDER — TRAMADOL HCL 50 MG PO TABS: 50.0000 mg | ORAL_TABLET | Freq: Four times a day (QID) | ORAL | Status: AC | PRN

## 2012-02-14 NOTE — Progress Notes (Signed)
Subjective:    Patient ID: Deborah Ortega, female    DOB: [birth year], 46 y.o.   MRN: [id number]  HPI patient is here today for annual physical. Patient feels well and has no complaints.  Past Medical History  Diagnosis Date  . ALLERGIC RHINITIS   . GERD   . 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 MIGRAINE HEADACHE   . RENAL CALCULUS, HX OF 06/25/2007  . THYROID NODULE    Family History  Problem Relation Age of Onset  . Diabetes Other   . Hyperlipidemia Other   . Stroke Other   . Ulcerative colitis Other    History  Substance Use Topics  . Smoking status: Never Smoker   . Smokeless tobacco: Not on file   Comment: Married, lives with spouse and 2 kids. Pt is a Runner, broadcasting/film/video  . Alcohol Use: No    Review of Systems Constitutional: Negative for fever or weight change.  Respiratory: Negative for cough and shortness of breath.   Cardiovascular: Negative for chest pain or palpitations.  Gastrointestinal: Negative for abdominal pain, no bowel changes.  Musculoskeletal: Negative for gait problem or joint swelling.  Skin: Negative for rash.  Neurological: Negative for dizziness or headache.  No other specific complaints in a complete review of systems (except as listed in HPI above).     Objective:   Physical Exam BP 98/72  Pulse 72  Temp 98.3 F (36.8 C) (Oral)  Ht 5' 3.5" (1.613 m)  Wt 109 lb 12.8 oz (49.805 kg)  BMI 19.15 kg/m2  SpO2 95% Weight: 109 lb 12.8 oz (49.805 kg)  Constitutional: She appears well-developed and well-nourished. No distress.  HENT: Head: Normocephalic and atraumatic. Ears: B TMs ok, no erythema or effusion; Nose: Nose normal. Mouth/Throat: Oropharynx is clear and moist. No oropharyngeal exudate.  Eyes: Conjunctivae and EOM are normal. Pupils are equal, round, and reactive to light. No scleral icterus.  Neck: Normal range of motion. Neck supple. No JVD present. L>R thyroid enlargement present.  Cardiovascular: Normal rate, regular rhythm and normal  heart sounds.  No murmur heard. No BLE edema. Pulmonary/Chest: Effort normal and breath sounds normal. No respiratory distress. She has no wheezes.  Abdominal: Soft. Bowel sounds are normal. She exhibits no distension. There is no tenderness. no masses Musculoskeletal: L knee without boggy synovitis - non tender to palpation over joint line; FROM and ligamentous function intact- other joints with normal range of motion, no joint effusions. No gross deformities Neurological: She is alert and oriented to person, place, and time. No cranial nerve deficit. Coordination normal.  Skin: Skin is warm and dry. No rash noted. No erythema.  Psychiatric: She has a normal mood and affect. Her behavior is normal. Judgment and thought content normal.   Lab Results  Component Value Date   WBC 7.0 02/08/2012   HGB 13.5 02/08/2012   HCT 41.4 02/08/2012   PLT 283.0 02/08/2012   GLUCOSE 77 02/08/2012   CHOL 208* 02/08/2012   TRIG 60.0 02/08/2012   HDL 58.20 02/08/2012   LDLDIRECT 133.4 02/08/2012   ALT 14 02/08/2012   AST 18 02/08/2012   NA 139 02/08/2012   K 4.4 02/08/2012   CL 105 02/08/2012   CREATININE 0.7 02/08/2012   BUN 12 02/08/2012   CO2 27 02/08/2012   TSH 0.70 02/08/2012   ECG: NSR @ 73 bpm - no ischemic changes    Assessment & Plan:  CPX/v70.0 - Patient has been counseled on age-appropriate routine health  concerns for screening and prevention. These are reviewed and up-to-date. Immunizations are up-to-date or declined. Labs and ECG reviewed.  L knee pain - hx and exam consistent with tendonitis - unable to tolerate NSAIDs due to rash - use tylenol and tramadol prn - new erx done - quad exercises to strengthen knee support and call if worse or unimproved

## 2012-02-14 NOTE — Telephone Encounter (Signed)
Message copied by Deatra James on Mon Feb 14, 2012 10:44 AM ------      Message from: COUSIN, SHARON T      Created: Mon Feb 14, 2012  9:57 AM      Regarding: PHY DATE  02/14/13       THANKS

## 2012-02-14 NOTE — Assessment & Plan Note (Signed)
Off synthroid since 08/2010 Overdue for endo follow up - encouraged to follow up now

## 2012-02-14 NOTE — Telephone Encounter (Signed)
Received staff msg pt made cpx for 02/15/13. Entering cpx labs in system... 02/14/12@10 :45am/LMB

## 2012-02-14 NOTE — Patient Instructions (Signed)
It was good to see you today. Health Maintenance reviewed - all recommended immunizations and age-appropriate screenings are up-to-date. Use tramadol for pain unrelieved by tylenol - Your prescription(s) have been submitted to your pharmacy. Please take as directed and contact our office if you believe you are having problem(s) with the medication(s). Other Medications reviewed, no changes at this time. Schedule follow up with Dr Everardo All (enodcrine thyroid) now - Please schedule followup in 12 months for medical physical and labs, call sooner if problems.

## 2012-02-16 ENCOUNTER — Ambulatory Visit (INDEPENDENT_AMBULATORY_CARE_PROVIDER_SITE_OTHER): Payer: 59 | Admitting: Endocrinology

## 2012-02-16 ENCOUNTER — Encounter: Payer: Self-pay | Admitting: Endocrinology

## 2012-02-16 VITALS — BP 100/62 | HR 67 | Temp 98.6°F | Ht 63.5 in | Wt 110.2 lb

## 2012-02-16 DIAGNOSIS — E041 Nontoxic single thyroid nodule: Secondary | ICD-10-CM

## 2012-02-16 NOTE — Patient Instructions (Addendum)
Recheck the ultrasound. you will receive a phone call, about a day and time for an appointment. You will receive a letter with results. Please return in 1 year. most of the time, a "lumpy thyroid" will eventually become overactive.  this is usually a slow process, happening over the span of many years.

## 2012-02-16 NOTE — Progress Notes (Signed)
  Subjective:    Patient ID: Deborah Ortega, female    DOB: 28-Nov-1965, 46 y.o.   MRN: [id number]  HPI Pt returns for left thyroid nodule.  She has had benign bx.  She does not notice the nodule.  She requests to have the ultrasound rechecked. Past Medical History  Diagnosis Date  . ALLERGIC RHINITIS   . GERD   . 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 MIGRAINE HEADACHE   . RENAL CALCULUS, HX OF 06/25/2007  . THYROID NODULE     Past Surgical History  Procedure Date  . Cesarean section     x's 2  . Meninogioma resection 07/25/09    History   Social History  . Marital Status: Married    Spouse Name: N/A    Number of Children: N/A  . Years of Education: N/A   Occupational History  . Not on file.   Social History Main Topics  . Smoking status: Never Smoker   . Smokeless tobacco: Not on file   Comment: Married, lives with spouse and 2 kids. Pt is a Runner, broadcasting/film/video  . Alcohol Use: No  . Drug Use: No  . Sexually Active:    Other Topics Concern  . Not on file   Social History Narrative  . No narrative on file    Current Outpatient Prescriptions on File Prior to Visit  Medication Sig Dispense Refill  . escitalopram (LEXAPRO) 10 MG tablet Take 10 mg by mouth daily.      Marland Kitchen loratadine (CLARITIN) 10 MG tablet Take 10 mg by mouth daily as needed.      . Multiple Vitamin (MULTIVITAMIN) tablet Take 1 tablet by mouth daily.      . traMADol (ULTRAM) 50 MG tablet Take 1 tablet (50 mg total) by mouth every 6 (six) hours as needed for pain.  30 tablet  0  . vitamin E (VITAMIN E) 400 UNIT capsule Take 400 Units by mouth daily.        Allergies  Allergen Reactions  . Ibuprofen Rash  . Levofloxacin     Family History  Problem Relation Age of Onset  . Diabetes Other   . Hyperlipidemia Other   . Stroke Other   . Ulcerative colitis Other     BP 100/62  Pulse 67  Temp 98.6 F (37 C) (Oral)  Ht 5' 3.5" (1.613 m)  Wt 110 lb 4 oz (50.009 kg)  BMI 19.22 kg/m2  SpO2  98%    Review of Systems Denies diffiuculty breathing and swallowing.    Objective:   Physical Exam VITAL SIGNS:  See vs page GENERAL: no distress. NECK: There is no palpable thyroid enlargement.  No thyroid nodule is palpable.  No palpable lymphadenopathy at the anterior neck.     Lab Results  Component Value Date   TSH 0.70 02/08/2012      Assessment & Plan:  Thyroid nodule, clinically unchanged

## 2012-02-18 ENCOUNTER — Other Ambulatory Visit (HOSPITAL_COMMUNITY): Payer: 59

## 2012-02-23 ENCOUNTER — Ambulatory Visit (HOSPITAL_COMMUNITY)
Admission: RE | Admit: 2012-02-23 | Discharge: 2012-02-23 | Disposition: A | Discharge status: Home / Self Care | Payer: 59 | Source: Ambulatory Visit | Attending: Endocrinology | Admitting: Endocrinology

## 2012-02-23 ENCOUNTER — Encounter: Payer: Self-pay | Admitting: Endocrinology

## 2012-02-23 DIAGNOSIS — E041 Nontoxic single thyroid nodule: Secondary | ICD-10-CM | POA: Insufficient documentation

## 2012-02-24 ENCOUNTER — Telehealth: Payer: Self-pay | Admitting: *Deleted

## 2012-02-24 NOTE — Telephone Encounter (Signed)
Called pt to inform results of thyroid U/S, left message for pt to callback office (letter also mailed to pt).

## 2012-02-25 NOTE — Telephone Encounter (Signed)
Pt's spouse informed of thyroid U/S (letter also mailed to pt).

## 2012-05-16 LAB — HM PAP SMEAR

## 2012-08-23 ENCOUNTER — Other Ambulatory Visit: Payer: Self-pay | Admitting: Obstetrics and Gynecology

## 2012-08-23 DIAGNOSIS — R928 Other abnormal and inconclusive findings on diagnostic imaging of breast: Secondary | ICD-10-CM

## 2012-09-04 ENCOUNTER — Other Ambulatory Visit: Payer: 59

## 2012-09-13 ENCOUNTER — Other Ambulatory Visit: Payer: 59

## 2012-09-26 ENCOUNTER — Ambulatory Visit
Admission: RE | Admit: 2012-09-26 | Discharge: 2012-09-26 | Disposition: A | Discharge status: Home / Self Care | Payer: 59 | Source: Ambulatory Visit | Attending: Obstetrics and Gynecology | Admitting: Obstetrics and Gynecology

## 2012-09-26 DIAGNOSIS — R928 Other abnormal and inconclusive findings on diagnostic imaging of breast: Secondary | ICD-10-CM

## 2013-02-06 ENCOUNTER — Ambulatory Visit (INDEPENDENT_AMBULATORY_CARE_PROVIDER_SITE_OTHER)
Admission: RE | Admit: 2013-02-06 | Discharge: 2013-02-06 | Disposition: A | Discharge status: Home / Self Care | Payer: 59 | Source: Ambulatory Visit | Attending: Internal Medicine | Admitting: Internal Medicine

## 2013-02-06 ENCOUNTER — Ambulatory Visit (INDEPENDENT_AMBULATORY_CARE_PROVIDER_SITE_OTHER): Payer: 59 | Admitting: Internal Medicine

## 2013-02-06 ENCOUNTER — Other Ambulatory Visit (INDEPENDENT_AMBULATORY_CARE_PROVIDER_SITE_OTHER): Payer: 59

## 2013-02-06 ENCOUNTER — Encounter: Payer: Self-pay | Admitting: Internal Medicine

## 2013-02-06 ENCOUNTER — Other Ambulatory Visit: Payer: Self-pay | Admitting: Internal Medicine

## 2013-02-06 VITALS — BP 94/62 | HR 78 | Temp 97.6°F | Ht 63.5 in | Wt 112.1 lb

## 2013-02-06 DIAGNOSIS — R05 Cough: Secondary | ICD-10-CM

## 2013-02-06 DIAGNOSIS — R350 Frequency of micturition: Secondary | ICD-10-CM

## 2013-02-06 DIAGNOSIS — K219 Gastro-esophageal reflux disease without esophagitis: Secondary | ICD-10-CM

## 2013-02-06 DIAGNOSIS — R058 Other specified cough: Secondary | ICD-10-CM | POA: Insufficient documentation

## 2013-02-06 DIAGNOSIS — J309 Allergic rhinitis, unspecified: Secondary | ICD-10-CM

## 2013-02-06 DIAGNOSIS — R911 Solitary pulmonary nodule: Secondary | ICD-10-CM

## 2013-02-06 LAB — URINALYSIS, ROUTINE W REFLEX MICROSCOPIC
Bilirubin Urine: NEGATIVE
Hgb urine dipstick: NEGATIVE
Total Protein, Urine: NEGATIVE
Urine Glucose: NEGATIVE

## 2013-02-06 MED ORDER — FLUTICASONE PROPIONATE 50 MCG/ACT NA SUSP: 2.0000 | Freq: Every day | NASAL | Status: DC

## 2013-02-06 MED ORDER — CEPHALEXIN 500 MG PO CAPS: 500.0000 mg | ORAL_CAPSULE | Freq: Four times a day (QID) | ORAL | Status: DC

## 2013-02-06 MED ORDER — METHYLPREDNISOLONE ACETATE 80 MG/ML IJ SUSP
80.0000 mg | Freq: Once | INTRAMUSCULAR | Status: AC
  Administered 2013-02-06: 80 mg via INTRAMUSCULAR

## 2013-02-06 MED ORDER — CETIRIZINE HCL 10 MG PO TABS: 10.0000 mg | ORAL_TABLET | Freq: Every day | ORAL | Status: DC

## 2013-02-06 NOTE — Patient Instructions (Signed)
You had the steroid shot today Please take all new medication as prescribed - the antibiotic, zyrtec, and flonase (all generic) Please go to the XRAY Department in the Basement (go straight as you get off the elevator) for the x-ray testing Please go to the LAB in the Basement (turn left off the elevator) for the tests to be done today - just the urine testing today  Please remember to sign up for My Chart if you have not done so, as this will be important to you in the future with finding out test results, communicating by private email, and scheduling acute appointments online when needed.  Please follow up with Dr Felicity Coyer as you have plannedd

## 2013-02-06 NOTE — Progress Notes (Signed)
Subjective:    Patient ID: Deborah Ortega, female    DOB: 06-Jan-1966, 47 y.o.   MRN: [id number]  HPI Here to f/u with acute onset 1 wk urianry freq and dysuria, but Denies urinary symptoms such as urgency, flank pain, hematuria or n/v, fever, chills.  Did have several days of old rx cipro but not clear if improved.  Also co chronic cough on PPI, no wheezing or known eosinophilia, but has mild to mod  - Does have several months ongoing nasal allergy symptoms with clearish congestion, itch and sneezing, without fever, pain, ST, cough, swelling or wheezing. No recent cxr Pt denies chest pain, increased sob or doe, wheezing, orthopnea, PND, increased LE swelling, palpitations, dizziness or syncope. Past Medical History  Diagnosis Date  . ALLERGIC RHINITIS   . GERD   . 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 MIGRAINE HEADACHE   . RENAL CALCULUS, HX OF 06/25/2007  . THYROID NODULE    Past Surgical History  Procedure Laterality Date  . Cesarean section      x's 2  . Meninogioma resection  07/25/09    reports that she has never smoked. She does not have any smokeless tobacco history on file. She reports that she does not drink alcohol or use illicit drugs. family history includes Diabetes in her other; Hyperlipidemia in her other; Stroke in her other; and Ulcerative colitis in her other. Allergies  Allergen Reactions  . Ibuprofen Rash  . Levofloxacin    Current Outpatient Prescriptions on File Prior to Visit  Medication Sig Dispense Refill  . escitalopram (LEXAPRO) 10 MG tablet Take 10 mg by mouth daily.      Marland Kitchen loratadine (CLARITIN) 10 MG tablet Take 10 mg by mouth daily as needed.      . Multiple Vitamin (MULTIVITAMIN) tablet Take 1 tablet by mouth daily.      . vitamin E (VITAMIN E) 400 UNIT capsule Take 400 Units by mouth daily.       No current facility-administered medications on file prior to visit.   Current Outpatient Prescriptions on File Prior to Visit  Medication Sig Dispense  Refill  . escitalopram (LEXAPRO) 10 MG tablet Take 10 mg by mouth daily.      Marland Kitchen loratadine (CLARITIN) 10 MG tablet Take 10 mg by mouth daily as needed.      . Multiple Vitamin (MULTIVITAMIN) tablet Take 1 tablet by mouth daily.      . vitamin E (VITAMIN E) 400 UNIT capsule Take 400 Units by mouth daily.       No current facility-administered medications on file prior to visit.    Review of Systems  Constitutional: Negative for unexpected weight change, or unusual diaphoresis  HENT: Negative for tinnitus.   Eyes: Negative for photophobia and visual disturbance.  Respiratory: Negative for choking and stridor.   Gastrointestinal: Negative for vomiting and blood in stool.  Genitourinary: Negative for hematuria and decreased urine volume.  Musculoskeletal: Negative for acute joint swelling Skin: Negative for color change and wound.  Neurological: Negative for tremors and numbness other than noted  Psychiatric/Behavioral: Negative for decreased concentration or  hyperactivity.       Objective:   Physical Exam BP 94/62  Pulse 78  Temp(Src) 97.6 F (36.4 C) (Oral)  Ht 5' 3.5" (1.613 m)  Wt 112 lb 2 oz (50.86 kg)  BMI 19.55 kg/m2  SpO2 96% VS noted,  Constitutional: Pt appears well-developed and well-nourished.  HENT: Head: NCAT.  Right  Ear: External ear normal.  Left Ear: External ear normal.  Bilat tm's with mild erythema.  Max sinus areas mild tender.  Pharynx with mild erythema, no exudate Eyes: Conjunctivae and EOM are normal. Pupils are equal, round, and reactive to light.  Neck: Normal range of motion. Neck supple.  Cardiovascular: Normal rate and regular rhythm.   Pulmonary/Chest: Effort normal and breath sounds normal.  Abd:  Soft,, non-distended, + BS with mild low mid abd tender Neurological: Pt is alert. Not confused  Skin: Skin is warm. No erythema.  Psychiatric: Pt behavior is normal. Thought content normal.     Assessment & Plan:

## 2013-02-07 ENCOUNTER — Ambulatory Visit (INDEPENDENT_AMBULATORY_CARE_PROVIDER_SITE_OTHER)
Admission: RE | Admit: 2013-02-07 | Discharge: 2013-02-07 | Disposition: A | Discharge status: Home / Self Care | Payer: 59 | Source: Ambulatory Visit | Attending: Internal Medicine | Admitting: Internal Medicine

## 2013-02-07 ENCOUNTER — Encounter: Payer: Self-pay | Admitting: Internal Medicine

## 2013-02-07 DIAGNOSIS — R911 Solitary pulmonary nodule: Secondary | ICD-10-CM

## 2013-02-07 DIAGNOSIS — R350 Frequency of micturition: Secondary | ICD-10-CM | POA: Insufficient documentation

## 2013-02-07 LAB — URINE CULTURE: Colony Count: 30000

## 2013-02-07 NOTE — Assessment & Plan Note (Signed)
stable overall by history and exam, and pt to continue medical treatment as before,  to f/u any worsening symptoms or concerns 

## 2013-02-07 NOTE — Assessment & Plan Note (Signed)
Mild to mod, for zyrtec/flonase,  to f/u any worsening symptoms or concerns

## 2013-02-07 NOTE — Assessment & Plan Note (Signed)
For cxr, though likely related to post nasal gtt,  to f/u any worsening symptoms or concerns

## 2013-02-07 NOTE — Assessment & Plan Note (Signed)
Likely cystitis, for urine studies, Mild to mod, for antibx course,  to f/u any worsening symptoms or concerns

## 2013-02-08 ENCOUNTER — Telehealth: Payer: Self-pay | Admitting: *Deleted

## 2013-02-08 NOTE — Telephone Encounter (Signed)
Pt called requesting CT results.  Read letter to her written by MD and informed her she would receive one in the mail.

## 2013-02-14 ENCOUNTER — Encounter: Payer: 59 | Admitting: Internal Medicine

## 2013-02-15 ENCOUNTER — Encounter: Payer: 59 | Admitting: Internal Medicine

## 2013-02-15 ENCOUNTER — Encounter: Payer: 59 | Admitting: Endocrinology

## 2013-02-17 ENCOUNTER — Emergency Department (HOSPITAL_COMMUNITY): Payer: 59

## 2013-02-17 ENCOUNTER — Encounter (HOSPITAL_COMMUNITY): Payer: Self-pay | Admitting: Emergency Medicine

## 2013-02-17 ENCOUNTER — Emergency Department (HOSPITAL_COMMUNITY)
Admission: EM | Admit: 2013-02-17 | Discharge: 2013-02-17 | Disposition: A | Discharge status: Home / Self Care | Payer: 59 | Attending: Emergency Medicine | Admitting: Emergency Medicine

## 2013-02-17 DIAGNOSIS — Z8719 Personal history of other diseases of the digestive system: Secondary | ICD-10-CM | POA: Insufficient documentation

## 2013-02-17 DIAGNOSIS — R112 Nausea with vomiting, unspecified: Secondary | ICD-10-CM | POA: Insufficient documentation

## 2013-02-17 DIAGNOSIS — Z8639 Personal history of other endocrine, nutritional and metabolic disease: Secondary | ICD-10-CM | POA: Insufficient documentation

## 2013-02-17 DIAGNOSIS — Z8709 Personal history of other diseases of the respiratory system: Secondary | ICD-10-CM | POA: Insufficient documentation

## 2013-02-17 DIAGNOSIS — Z8679 Personal history of other diseases of the circulatory system: Secondary | ICD-10-CM | POA: Insufficient documentation

## 2013-02-17 DIAGNOSIS — Z79899 Other long term (current) drug therapy: Secondary | ICD-10-CM | POA: Insufficient documentation

## 2013-02-17 DIAGNOSIS — N23 Unspecified renal colic: Secondary | ICD-10-CM | POA: Insufficient documentation

## 2013-02-17 DIAGNOSIS — Z862 Personal history of diseases of the blood and blood-forming organs and certain disorders involving the immune mechanism: Secondary | ICD-10-CM | POA: Insufficient documentation

## 2013-02-17 LAB — CBC WITH DIFFERENTIAL/PLATELET
Eosinophils Absolute: 0 10*3/uL (ref 0.0–0.7)
Hemoglobin: 13 g/dL (ref 12.0–15.0)
Lymphocytes Relative: 5 % — ABNORMAL LOW (ref 12–46)
Lymphs Abs: 0.8 10*3/uL (ref 0.7–4.0)
Monocytes Relative: 5 % (ref 3–12)
Neutro Abs: 14 10*3/uL — ABNORMAL HIGH (ref 1.7–7.7)
Neutrophils Relative %: 90 % — ABNORMAL HIGH (ref 43–77)
Platelets: 325 10*3/uL (ref 150–400)
RBC: 4.77 MIL/uL (ref 3.87–5.11)
WBC: 15.6 10*3/uL — ABNORMAL HIGH (ref 4.0–10.5)

## 2013-02-17 LAB — URINALYSIS, ROUTINE W REFLEX MICROSCOPIC
Nitrite: NEGATIVE
Specific Gravity, Urine: 1.026 (ref 1.005–1.030)
Urobilinogen, UA: 1 mg/dL (ref 0.0–1.0)
pH: 8 (ref 5.0–8.0)

## 2013-02-17 LAB — BASIC METABOLIC PANEL
Chloride: 102 mEq/L (ref 96–112)
GFR calc non Af Amer: 83 mL/min — ABNORMAL LOW (ref >90)
Glucose, Bld: 148 mg/dL — ABNORMAL HIGH (ref 70–99)
Potassium: 4 mEq/L (ref 3.5–5.1)
Sodium: 139 mEq/L (ref 135–145)

## 2013-02-17 LAB — URINE MICROSCOPIC-ADD ON

## 2013-02-17 MED ORDER — PROMETHAZINE HCL 25 MG PO TABS: 25.0000 mg | ORAL_TABLET | Freq: Four times a day (QID) | ORAL | Status: DC | PRN

## 2013-02-17 MED ORDER — SODIUM CHLORIDE 0.9 % IV BOLUS (SEPSIS)
500.0000 mL | Freq: Once | INTRAVENOUS | Status: AC
  Administered 2013-02-17: 500 mL via INTRAVENOUS

## 2013-02-17 MED ORDER — HYDROMORPHONE HCL PF 1 MG/ML IJ SOLN
1.0000 mg | Freq: Once | INTRAMUSCULAR | Status: AC
  Administered 2013-02-17: 1 mg via INTRAVENOUS
  Filled 2013-02-17: qty 1

## 2013-02-17 MED ORDER — OXYCODONE-ACETAMINOPHEN 5-325 MG PO TABS: 1.0000 | ORAL_TABLET | ORAL | Status: DC | PRN

## 2013-02-17 MED ORDER — ONDANSETRON HCL 4 MG/2ML IJ SOLN
4.0000 mg | Freq: Once | INTRAMUSCULAR | Status: AC
  Administered 2013-02-17: 4 mg via INTRAVENOUS
  Filled 2013-02-17: qty 2

## 2013-02-17 NOTE — ED Provider Notes (Signed)
History    CSN: [id number] Arrival date & time 02/17/13  1191  First MD Initiated Contact with Patient 02/17/13 1007     Chief Complaint  Patient presents with  . Flank Pain   (Consider location/radiation/quality/duration/timing/severity/associated sxs/prior Treatment) HPI Comments: Pt reports she has had urinary symptoms for several weeks, thought she might have a UTI, was treated with cipro then keflex by her PCP with eventual negative test (?culture). Initial symptoms were urinary frequency and mild dysuria.  States 3-4 days ago she began having right flank pain.  Was seen at an ER in [facility] yesterday and was diagnosed with 4mm ureteral stone by CT scan.  States she was given 2 pills today take home yesterday and never filled her prescriptions (vicodin and flomax).  Pain is constant, severe.  Associated vomiting, emesis is contents of her stomach.  Denies fever, abnormal vaginal discharge or bleeding, change in bowel habits.  LMP was "a few weeks ago," normal and on time.  Pt denies possibility of pregnancy.   Patient is a 47 y.o. female presenting with flank pain. The history is provided by the patient and medical records.  Flank Pain Associated symptoms include abdominal pain, nausea and vomiting. Pertinent negatives include no chills, fever or myalgias.   Past Medical History  Diagnosis Date  . ALLERGIC RHINITIS   . GERD   . 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 MIGRAINE HEADACHE   . RENAL CALCULUS, HX OF 06/25/2007  . THYROID NODULE    Past Surgical History  Procedure Laterality Date  . Cesarean section      x's 2  . Meninogioma resection  07/25/09   Family History  Problem Relation Age of Onset  . Diabetes Other   . Hyperlipidemia Other   . Stroke Other   . Ulcerative colitis Other    History  Substance Use Topics  . Smoking status: Never Smoker   . Smokeless tobacco: Not on file     Comment: Married, lives with spouse and 2 kids. Pt is a Runner, broadcasting/film/video  . Alcohol Use: No    OB History   Grav Para Term Preterm Abortions TAB SAB Ect Mult Living                 Review of Systems  Constitutional: Negative for fever and chills.  Gastrointestinal: Positive for nausea, vomiting and abdominal pain. Negative for diarrhea, constipation and blood in stool.  Genitourinary: Positive for flank pain. Negative for vaginal bleeding, vaginal discharge and menstrual problem.  Musculoskeletal: Negative for myalgias.    Allergies  Ibuprofen and Levofloxacin  Home Medications   Current Outpatient Rx  Name  Route  Sig  Dispense  Refill  . cephALEXin (KEFLEX) 500 MG capsule   Oral   Take 1 capsule (500 mg total) by mouth 4 (four) times daily.   40 capsule   0   . cetirizine (ZYRTEC) 10 MG tablet   Oral   Take 1 tablet (10 mg total) by mouth daily.   30 tablet   11   . escitalopram (LEXAPRO) 10 MG tablet   Oral   Take 10 mg by mouth daily.         . fluticasone (FLONASE) 50 MCG/ACT nasal spray   Nasal   Place 2 sprays into the nose daily.   16 g   5   . HYDROcodone-acetaminophen (NORCO/VICODIN) 5-325 MG per tablet   Oral   Take 1 tablet by mouth every 4 (four)  hours as needed for pain.         . tamsulosin (FLOMAX) 0.4 MG CAPS   Oral   Take 0.4 mg by mouth daily.          BP 126/64  Pulse 64  Temp(Src) 97.6 F (36.4 C) (Oral)  Resp 20  SpO2 100%  LMP 02/03/2013 Physical Exam  Nursing note and vitals reviewed. Constitutional: She appears well-developed and well-nourished. No distress.  HENT:  Head: Normocephalic and atraumatic.  Neck: Neck supple.  Cardiovascular: Normal rate and regular rhythm.   Pulmonary/Chest: Effort normal and breath sounds normal. No respiratory distress. She has no wheezes. She has no rales.  Abdominal: Soft. She exhibits no distension. There is no tenderness. There is no rebound and no guarding.  Neurological: She is alert.  Skin: She is not diaphoretic.    ED Course  Procedures (including critical care  time) Labs Reviewed  CBC WITH DIFFERENTIAL - Abnormal; Notable for the following:    WBC 15.6 (*)    Neutrophils Relative % 90 (*)    Neutro Abs 14.0 (*)    Lymphocytes Relative 5 (*)    All other components within normal limits  BASIC METABOLIC PANEL - Abnormal; Notable for the following:    Glucose, Bld 148 (*)    GFR calc non Af Amer 83 (*)    All other components within normal limits  URINALYSIS, ROUTINE W REFLEX MICROSCOPIC - Abnormal; Notable for the following:    Hgb urine dipstick SMALL (*)    Ketones, ur >80 (*)    Protein, ur 30 (*)    All other components within normal limits  URINE MICROSCOPIC-ADD ON - Abnormal; Notable for the following:    Squamous Epithelial / LPF FEW (*)    All other components within normal limits  URINE CULTURE   US Renal  02/17/2013   *RADIOLOGY REPORT*  Clinical Data:  Right flank pain  RENAL/URINARY TRACT ULTRASOUND COMPLETE  Comparison:  01/11/2008  Findings:  Right Kidney:  Right kidney measures 10 cm.  There is moderate right hydronephrosis and hydroureter.  There is an echogenic structure within the proximal right ureter measuring approximately 4 mm.  Left Kidney:  Measures 10.6 cm.  Normal in size and parenchymal echogenicity.  No evidence of mass or hydronephrosis.  Bladder:  Nonvisualization of the ureteral jets.  Appears normal for degree of bladder distention. Other:  There is echogenic structure within the right hepatic lobe that is indeterminate measuring 1.4 x 1.5 x 1.2 cm.  IMPRESSION:  1.  Right-sided hydronephrosis. 2.  Suspect proximal right ureteral stone. 3.  Echogenic structure within the liver is nonspecific but may represent a small hemangioma. Suggest follow-up imaging in 6 months to confirm stability.  At this time the study of choice would be a contrast-enhanced MRI.   Original Report Authenticated By: Signa Kell, M.D.    11:54 AM Pt reports pain currently well controlled.   I took patient's vicodin prescription, traded for  percocet  1. Renal colic on right side     MDM  Pt with known 4mm right ureteral stone with uncontrolled pain and vomiting, had not filled pain medications yet.  Pain and nausea relieved in ED with IV medications.  Pt comfortable to be d/c home with urology follow up.  Pt sees Dr Vernie Ammons.  Pt with elevated WBC count but with increased pain and vomiting.  Doubt UTI given UA today.  Mild hyperglycemia.  US shows right sided hydronephrosis.  Pt  d/c with percocet, phenergan (pt also has zofran at home - discussed medication and its uses with patient and significant other).  I shreded vicodin prescription, with patient's permission.  Discussed all results with patient.  Pt given return precautions.  Pt verbalizes understanding and agrees with plan.     Trixie Dredge, PA-C 02/17/13 1436

## 2013-02-17 NOTE — ED Notes (Signed)
Assuming care of pt now.  She is trying to rest in bed but the pt in bed next to her is snoring.  She says she has trouble sleeping anyway and this isn't helping.  Otherwise pt hasn't a complaint of pain and family is at bedside.

## 2013-02-17 NOTE — ED Notes (Signed)
U/S tech in room performing test.

## 2013-02-17 NOTE — ED Notes (Signed)
Patient reports lower right flank pain starting three days ago. Patient visited ED out of town and found a 4mm urethral stone, which has not been passed or removed. Patient reports that pain subsided and returned even stronger yesterday.

## 2013-02-17 NOTE — ED Provider Notes (Signed)
Medical screening examination/treatment/procedure(s) were performed by non-physician practitioner and as supervising physician I was immediately available for consultation/collaboration.  Doug Sou, MD 02/17/13 [phone]

## 2013-02-18 LAB — URINE CULTURE: Culture: NO GROWTH

## 2013-02-26 ENCOUNTER — Other Ambulatory Visit (INDEPENDENT_AMBULATORY_CARE_PROVIDER_SITE_OTHER): Payer: 59

## 2013-02-26 ENCOUNTER — Ambulatory Visit (INDEPENDENT_AMBULATORY_CARE_PROVIDER_SITE_OTHER): Payer: 59 | Admitting: Endocrinology

## 2013-02-26 ENCOUNTER — Encounter: Payer: Self-pay | Admitting: Endocrinology

## 2013-02-26 ENCOUNTER — Ambulatory Visit (INDEPENDENT_AMBULATORY_CARE_PROVIDER_SITE_OTHER): Payer: 59 | Admitting: Internal Medicine

## 2013-02-26 ENCOUNTER — Encounter: Payer: Self-pay | Admitting: Internal Medicine

## 2013-02-26 VITALS — BP 100/72 | HR 71 | Temp 98.2°F | Wt 111.4 lb

## 2013-02-26 VITALS — BP 122/80 | HR 80 | Ht 64.0 in | Wt 110.0 lb

## 2013-02-26 DIAGNOSIS — Z87442 Personal history of urinary calculi: Secondary | ICD-10-CM

## 2013-02-26 DIAGNOSIS — R05 Cough: Secondary | ICD-10-CM

## 2013-02-26 DIAGNOSIS — Z136 Encounter for screening for cardiovascular disorders: Secondary | ICD-10-CM

## 2013-02-26 DIAGNOSIS — Z0000 Encounter for general adult medical examination without abnormal findings: Secondary | ICD-10-CM

## 2013-02-26 DIAGNOSIS — R059 Cough, unspecified: Secondary | ICD-10-CM

## 2013-02-26 DIAGNOSIS — E041 Nontoxic single thyroid nodule: Secondary | ICD-10-CM

## 2013-02-26 LAB — CBC WITH DIFFERENTIAL/PLATELET
Basophils Absolute: 0.1 10*3/uL (ref 0.0–0.1)
HCT: 40.3 % (ref 36.0–46.0)
Lymphs Abs: 2.1 10*3/uL (ref 0.7–4.0)
MCHC: 32.6 g/dL (ref 30.0–36.0)
MCV: 84.9 fl (ref 78.0–100.0)
Monocytes Absolute: 0.4 10*3/uL (ref 0.1–1.0)
Platelets: 341 10*3/uL (ref 150.0–400.0)
RDW: 14.3 % (ref 11.5–14.6)

## 2013-02-26 LAB — BASIC METABOLIC PANEL
BUN: 13 mg/dL (ref 6–23)
Chloride: 105 mEq/L (ref 96–112)
Glucose, Bld: 94 mg/dL (ref 70–99)
Potassium: 4.1 mEq/L (ref 3.5–5.1)

## 2013-02-26 LAB — LDL CHOLESTEROL, DIRECT: Direct LDL: 142.6 mg/dL

## 2013-02-26 LAB — LIPID PANEL: Cholesterol: 203 mg/dL — ABNORMAL HIGH (ref 0–200)

## 2013-02-26 MED ORDER — OMEPRAZOLE 20 MG PO CPDR: 20.0000 mg | DELAYED_RELEASE_CAPSULE | Freq: Every day | ORAL | Status: DC

## 2013-02-26 MED ORDER — BENZONATATE 200 MG PO CAPS: 200.0000 mg | ORAL_CAPSULE | Freq: Two times a day (BID) | ORAL | Status: DC

## 2013-02-26 NOTE — Patient Instructions (Addendum)
Let's recheck the ultrasound. you will receive a phone call, about a day and time for an appointment.  We'll contact you with results. Please return in 1 year. most of the time, a "lumpy thyroid" will eventually become overactive.  this is usually a slow process, happening over the span of many years.

## 2013-02-26 NOTE — Assessment & Plan Note (Signed)
Chronic cough - eval 01/2013 reviewed - neg CT chest 01/2013 -  continued "clear mucus" despite allergic rhinitis tx -  continue antihistamine, nasal steroid, add PPI bid and tessalon BID -  if unimproved in next 30d, pt will notify us for eval pulm specialist as needed

## 2013-02-26 NOTE — Patient Instructions (Signed)
It was good to see you today. We have reviewed your prior records including labs and tests today Health Maintenance reviewed - all recommended immunizations and age-appropriate screenings are up-to-date. Test(s) ordered today. Your results will be released to MyChart (or called to you) after review, usually within 72hours after test completion. If any changes need to be made, you will be notified at that same time. Your medications were reviewed and updated -add omeprazole twice daily and Tessalon twice daily to control cough symptoms. If unimproved in next 30 days, let us know if referral to pulmonary specialist as needed Your prescription(s) have been submitted to your pharmacy. Please take as directed and contact our office if you believe you are having problem(s) with the medication(s). Please schedule followup in 12 months for annual visit, call sooner if problems.  Health Maintenance, Females A healthy lifestyle and preventative care can promote health and wellness.  Maintain regular health, dental, and eye exams.  Eat a healthy diet. Foods like vegetables, fruits, whole grains, low-fat dairy products, and lean protein foods contain the nutrients you need without too many calories. Decrease your intake of foods high in solid fats, added sugars, and salt. Get information about a proper diet from your caregiver, if necessary.  Regular physical exercise is one of the most important things you can do for your health. Most adults should get at least 150 minutes of moderate-intensity exercise (any activity that increases your heart rate and causes you to sweat) each week. In addition, most adults need muscle-strengthening exercises on 2 or more days a week.   Maintain a healthy weight. The body mass index (BMI) is a screening tool to identify possible weight problems. It provides an estimate of body fat based on height and weight. Your caregiver can help determine your BMI, and can help you achieve  or maintain a healthy weight. For adults 20 years and older:  A BMI below 18.5 is considered underweight.  A BMI of 18.5 to 24.9 is normal.  A BMI of 25 to 29.9 is considered overweight.  A BMI of 30 and above is considered obese.  Maintain normal blood lipids and cholesterol by exercising and minimizing your intake of saturated fat. Eat a balanced diet with plenty of fruits and vegetables. Blood tests for lipids and cholesterol should begin at age 51 and be repeated every 5 years. If your lipid or cholesterol levels are high, you are over 50, or you are a high risk for heart disease, you may need your cholesterol levels checked more frequently.Ongoing high lipid and cholesterol levels should be treated with medicines if diet and exercise are not effective.  If you smoke, find out from your caregiver how to quit. If you do not use tobacco, do not start.  If you are pregnant, do not drink alcohol. If you are breastfeeding, be very cautious about drinking alcohol. If you are not pregnant and choose to drink alcohol, do not exceed 1 drink per day. One drink is considered to be 12 ounces (355 mL) of beer, 5 ounces (148 mL) of wine, or 1.5 ounces (44 mL) of liquor.  Avoid use of street drugs. Do not share needles with anyone. Ask for help if you need support or instructions about stopping the use of drugs.  High blood pressure causes heart disease and increases the risk of stroke. Blood pressure should be checked at least every 1 to 2 years. Ongoing high blood pressure should be treated with medicines, if weight loss and  exercise are not effective.  If you are 47 to 45 years old, ask your caregiver if you should take aspirin to prevent strokes.  Diabetes screening involves taking a blood sample to check your fasting blood sugar level. This should be done once every 3 years, after age 36, if you are within normal weight and without risk factors for diabetes. Testing should be considered at a younger  age or be carried out more frequently if you are overweight and have at least 1 risk factor for diabetes.  Breast cancer screening is essential preventative care for women. You should practice "breast self-awareness." This means understanding the normal appearance and feel of your breasts and may include breast self-examination. Any changes detected, no matter how small, should be reported to a caregiver. Women in their 39s and 30s should have a clinical breast exam (CBE) by a caregiver as part of a regular health exam every 1 to 3 years. After age 60, women should have a CBE every year. Starting at age 31, women should consider having a mammogram (breast X-ray) every year. Women who have a family history of breast cancer should talk to their caregiver about genetic screening. Women at a high risk of breast cancer should talk to their caregiver about having an MRI and a mammogram every year.  The Pap test is a screening test for cervical cancer. Women should have a Pap test starting at age 78. Between ages 45 and 31, Pap tests should be repeated every 2 years. Beginning at age 14, you should have a Pap test every 3 years as long as the past 3 Pap tests have been normal. If you had a hysterectomy for a problem that was not cancer or a condition that could lead to cancer, then you no longer need Pap tests. If you are between ages 79 and 37, and you have had normal Pap tests going back 10 years, you no longer need Pap tests. If you have had past treatment for cervical cancer or a condition that could lead to cancer, you need Pap tests and screening for cancer for at least 20 years after your treatment. If Pap tests have been discontinued, risk factors (such as a new sexual partner) need to be reassessed to determine if screening should be resumed. Some women have medical problems that increase the chance of getting cervical cancer. In these cases, your caregiver may recommend more frequent screening and Pap  tests.  The human papillomavirus (HPV) test is an additional test that may be used for cervical cancer screening. The HPV test looks for the virus that can cause the cell changes on the cervix. The cells collected during the Pap test can be tested for HPV. The HPV test could be used to screen women aged 59 years and older, and should be used in women of any age who have unclear Pap test results. After the age of 65, women should have HPV testing at the same frequency as a Pap test.  Colorectal cancer can be detected and often prevented. Most routine colorectal cancer screening begins at the age of 4 and continues through age 39. However, your caregiver may recommend screening at an earlier age if you have risk factors for colon cancer. On a yearly basis, your caregiver may provide home test kits to check for hidden blood in the stool. Use of a small camera at the end of a tube, to directly examine the colon (sigmoidoscopy or colonoscopy), can detect the earliest forms of  colorectal cancer. Talk to your caregiver about this at age 82, when routine screening begins. Direct examination of the colon should be repeated every 5 to 10 years through age 31, unless early forms of pre-cancerous polyps or small growths are found.  Hepatitis C blood testing is recommended for all people born from [birth year] through 1963 and any individual with known risks for hepatitis C.  Practice safe sex. Use condoms and avoid high-risk sexual practices to reduce the spread of sexually transmitted infections (STIs). Sexually active women aged 19 and younger should be checked for Chlamydia, which is a common sexually transmitted infection. Older women with new or multiple partners should also be tested for Chlamydia. Testing for other STIs is recommended if you are sexually active and at increased risk.  Osteoporosis is a disease in which the bones lose minerals and strength with aging. This can result in serious bone fractures. The risk  of osteoporosis can be identified using a bone density scan. Women ages 61 and over and women at risk for fractures or osteoporosis should discuss screening with their caregivers. Ask your caregiver whether you should be taking a calcium supplement or vitamin D to reduce the rate of osteoporosis.  Menopause can be associated with physical symptoms and risks. Hormone replacement therapy is available to decrease symptoms and risks. You should talk to your caregiver about whether hormone replacement therapy is right for you.  Use sunscreen with a sun protection factor (SPF) of 30 or greater. Apply sunscreen liberally and repeatedly throughout the day. You should seek shade when your shadow is shorter than you. Protect yourself by wearing long sleeves, pants, a wide-brimmed hat, and sunglasses year round, whenever you are outdoors.  Notify your caregiver of new moles or changes in moles, especially if there is a change in shape or color. Also notify your caregiver if a mole is larger than the size of a pencil eraser.  Stay current with your immunizations. Document Released: 02/15/2011 Document Revised: 10/25/2011 Document Reviewed: 02/15/2011 [facility] Patient Information 2014 [place], [place].

## 2013-02-26 NOTE — Progress Notes (Signed)
Subjective:    Patient ID: Deborah Ortega, female    DOB: 1966-05-24, 47 y.o.   MRN: [id number]  HPI  patient is here today for annual physical. Patient feels well overall  Reviewed chronic medical issues and interval medical events - continued cough  Past Medical History  Diagnosis Date  . ALLERGIC RHINITIS   . GERD   . 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 MIGRAINE HEADACHE   . RENAL CALCULUS, HX OF 06/25/2007, 02/2013  . THYROID NODULE    Family History  Problem Relation Age of Onset  . Diabetes Other   . Hyperlipidemia Other   . Stroke Other   . Ulcerative colitis Other    History  Substance Use Topics  . Smoking status: Never Smoker   . Smokeless tobacco: Not on file     Comment: Married, lives with spouse and 2 kids. Pt is a Runner, broadcasting/film/video  . Alcohol Use: No    Review of Systems  Constitutional: Negative for fever or weight change.  Respiratory: Negative for shortness of breath.   Cardiovascular: Negative for chest pain or palpitations.  Gastrointestinal: Negative for abdominal pain, no bowel changes.  Musculoskeletal: Negative for gait problem or joint swelling.  Skin: Negative for rash.  Neurological: Negative for dizziness or headache.  No other specific complaints in a complete review of systems (except as listed in HPI above).     Objective:   Physical Exam  BP 100/72  Pulse 71  Temp(Src) 98.2 F (36.8 C) (Oral)  Wt 111 lb 6.4 oz (50.531 kg)  BMI 19.42 kg/m2  SpO2 98%  LMP 02/03/2013 Weight: 111 lb 6.4 oz (50.531 kg)  Wt Readings from Last 3 Encounters:  02/26/13 111 lb 6.4 oz (50.531 kg)  02/06/13 112 lb 2 oz (50.86 kg)  02/16/12 110 lb 4 oz (50.009 kg)    Constitutional: She appears well-developed and well-nourished. No distress.  HENT: Head: Normocephalic and atraumatic. Ears: B TMs ok, no erythema or effusion; Nose: Nose normal. Mouth/Throat: Oropharynx is clear and moist. No oropharyngeal exudate.  Eyes: Conjunctivae and EOM are normal.  Pupils are equal, round, and reactive to light. No scleral icterus.  Neck: Normal range of motion. Neck supple. No JVD present. L>R thyroid enlargement present, unchanged.  Cardiovascular: Normal rate, regular rhythm and normal heart sounds.  No murmur heard. No BLE edema. Pulmonary/Chest: Effort normal and breath sounds normal. No respiratory distress. She has no wheezes.  Abdominal: Soft. Bowel sounds are normal. She exhibits no distension. There is no tenderness. no masses Musculoskeletal: joints with normal range of motion, no joint effusions. No gross deformities Neurological: She is alert and oriented to person, place, and time. No cranial nerve deficit. Coordination normal.  Skin: Skin is warm and dry. No rash noted. No erythema.  Psychiatric: She has a normal mood and affect. Her behavior is normal. Judgment and thought content normal.   Lab Results  Component Value Date   WBC 15.6* 02/17/2013   HGB 13.0 02/17/2013   HCT 38.6 02/17/2013   PLT 325 02/17/2013   GLUCOSE 148* 02/17/2013   CHOL 208* 02/08/2012   TRIG 60.0 02/08/2012   HDL 58.20 02/08/2012   LDLDIRECT 133.4 02/08/2012   ALT 14 02/08/2012   AST 18 02/08/2012   NA 139 02/17/2013   K 4.0 02/17/2013   CL 102 02/17/2013   CREATININE 0.84 02/17/2013   BUN 17 02/17/2013   CO2 22 02/17/2013   TSH 0.70 02/08/2012   ECG:  compared to 02/2012: PR .11 - NSR @ 83 bpm - no ischemic changes  US Renal  02/17/2013   *RADIOLOGY REPORT*  Clinical Data:  Right flank pain  RENAL/URINARY TRACT ULTRASOUND COMPLETE  Comparison:  01/11/2008  Findings:  Right Kidney:  Right kidney measures 10 cm.  There is moderate right hydronephrosis and hydroureter.  There is an echogenic structure within the proximal right ureter measuring approximately 4 mm.  Left Kidney:  Measures 10.6 cm.  Normal in size and parenchymal echogenicity.  No evidence of mass or hydronephrosis.  Bladder:  Nonvisualization of the ureteral jets.  Appears normal for degree of bladder distention. Other:   There is echogenic structure within the right hepatic lobe that is indeterminate measuring 1.4 x 1.5 x 1.2 cm.  IMPRESSION:  1.  Right-sided hydronephrosis. 2.  Suspect proximal right ureteral stone. 3.  Echogenic structure within the liver is nonspecific but may represent a small hemangioma. Suggest follow-up imaging in 6 months to confirm stability.  At this time the study of choice would be a contrast-enhanced MRI.   Original Report Authenticated By: Signa Kell, M.D.      Assessment & Plan:  CPX/v70.0 - Patient has been counseled on age-appropriate routine health concerns for screening and prevention. These are reviewed and up-to-date. Immunizations are up-to-date or declined. Labs and ECG reviewed.  Also See problem list. Medications and labs reviewed today.

## 2013-02-26 NOTE — Assessment & Plan Note (Signed)
Off synthroid since 08/2010 annual endo follow up reviewed -  ultrasound 02/2012 stable

## 2013-02-26 NOTE — Progress Notes (Signed)
  Subjective:    Patient ID: Deborah Ortega, female    DOB: 11-Mar-1966, 47 y.o.   MRN: [id number]  HPI Pt returns for f/u of left thyroid nodule (dx'ed 2003, when she had benign bx).  She does not notice the nodule.  pt states she feels well in general. Past Medical History  Diagnosis Date  . ALLERGIC RHINITIS   . GERD   . 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 MIGRAINE HEADACHE   . RENAL CALCULUS, HX OF 06/25/2007, 02/2013  . THYROID NODULE     Past Surgical History  Procedure Laterality Date  . Cesarean section      x's 2  . Meninogioma resection  07/25/09    History   Social History  . Marital Status: Married    Spouse Name: N/A    Number of Children: N/A  . Years of Education: N/A   Occupational History  . Not on file.   Social History Main Topics  . Smoking status: Never Smoker   . Smokeless tobacco: Not on file     Comment: Married, lives with spouse and 2 kids. Pt is a Runner, broadcasting/film/video  . Alcohol Use: No  . Drug Use: No  . Sexually Active:    Other Topics Concern  . Not on file   Social History Narrative  . No narrative on file    Current Outpatient Prescriptions on File Prior to Visit  Medication Sig Dispense Refill  . cetirizine (ZYRTEC) 10 MG tablet Take 1 tablet (10 mg total) by mouth daily.  30 tablet  11  . escitalopram (LEXAPRO) 10 MG tablet Take 10 mg by mouth daily.      . fluticasone (FLONASE) 50 MCG/ACT nasal spray Place 2 sprays into the nose daily.  16 g  5   No current facility-administered medications on file prior to visit.    Allergies  Allergen Reactions  . Ibuprofen Rash  . Levofloxacin     Family History  Problem Relation Age of Onset  . Diabetes Other   . Hyperlipidemia Other   . Stroke Other   . Ulcerative colitis Other     BP 122/80  Pulse 80  Ht 5\' 4"  (1.626 m)  Wt 110 lb (49.896 kg)  BMI 18.87 kg/m2  SpO2 94%  LMP 02/03/2013  Review of Systems Denies difficulty swallowing or breathing.      Objective:   Physical  Exam VITAL SIGNS:  See vs page GENERAL: no distress A 2 cm left thyroid nodule is noted.   Lab Results  Component Value Date   TSH 0.86 02/26/2013      Assessment & Plan:  Thyroid nodule, clinically stable

## 2013-02-26 NOTE — Assessment & Plan Note (Signed)
Recurrent event 02/2013 reviewed - passed with aid of Flomax -  Strong FH same - ?preventive med if more stones Er visit x 2 reviewed Reassurance provided

## 2013-03-02 ENCOUNTER — Ambulatory Visit
Admission: RE | Admit: 2013-03-02 | Discharge: 2013-03-02 | Disposition: A | Discharge status: Home / Self Care | Payer: 59 | Source: Ambulatory Visit | Attending: Endocrinology | Admitting: Endocrinology

## 2013-03-02 DIAGNOSIS — E041 Nontoxic single thyroid nodule: Secondary | ICD-10-CM

## 2013-04-22 ENCOUNTER — Other Ambulatory Visit: Payer: Self-pay | Admitting: Internal Medicine

## 2013-06-21 ENCOUNTER — Other Ambulatory Visit: Payer: Self-pay

## 2013-08-14 ENCOUNTER — Ambulatory Visit (INDEPENDENT_AMBULATORY_CARE_PROVIDER_SITE_OTHER)
Admission: RE | Admit: 2013-08-14 | Discharge: 2013-08-14 | Disposition: A | Discharge status: Home / Self Care | Payer: 59 | Source: Ambulatory Visit | Attending: Internal Medicine | Admitting: Internal Medicine

## 2013-08-14 ENCOUNTER — Ambulatory Visit (INDEPENDENT_AMBULATORY_CARE_PROVIDER_SITE_OTHER): Payer: 59 | Admitting: Internal Medicine

## 2013-08-14 ENCOUNTER — Encounter: Payer: Self-pay | Admitting: Internal Medicine

## 2013-08-14 VITALS — BP 100/78 | HR 76 | Temp 100.2°F | Resp 16 | Wt 114.0 lb

## 2013-08-14 DIAGNOSIS — R059 Cough, unspecified: Secondary | ICD-10-CM

## 2013-08-14 DIAGNOSIS — R05 Cough: Secondary | ICD-10-CM

## 2013-08-14 DIAGNOSIS — J069 Acute upper respiratory infection, unspecified: Secondary | ICD-10-CM | POA: Insufficient documentation

## 2013-08-14 MED ORDER — PROMETHAZINE-CODEINE 6.25-10 MG/5ML PO SYRP: 5.0000 mL | ORAL_SOLUTION | ORAL | Status: DC | PRN

## 2013-08-14 MED ORDER — AZITHROMYCIN 250 MG PO TABS: ORAL_TABLET | ORAL | Status: DC

## 2013-08-14 NOTE — Progress Notes (Signed)
Pre visit review using our clinic review tool, if applicable. No additional management support is needed unless otherwise documented below in the visit note. 

## 2013-08-14 NOTE — Progress Notes (Signed)
   Subjective:    URI  The current episode started in the past 7 days. The problem has been gradually worsening. The maximum temperature recorded prior to her arrival was 100 - 100.9 F. Associated symptoms include congestion, coughing, headaches, joint pain, rhinorrhea, sinus pain and a sore throat. Pertinent negatives include no wheezing.      Review of Systems  HENT: Positive for congestion, rhinorrhea and sore throat.   Respiratory: Positive for cough. Negative for wheezing.   Musculoskeletal: Positive for joint pain.  Neurological: Positive for headaches.       Objective:   Physical Exam  Constitutional: She appears well-developed. No distress.  HENT:  Head: Normocephalic.  Right Ear: External ear normal.  Left Ear: External ear normal.  Mouth/Throat: Oropharynx is clear and moist.  eryth throat  Eyes: Conjunctivae are normal. Pupils are equal, round, and reactive to light. Right eye exhibits no discharge. Left eye exhibits no discharge.  Neck: Normal range of motion. Neck supple. No JVD present. No tracheal deviation present. No thyromegaly present.  Cardiovascular: Normal rate, regular rhythm and normal heart sounds.   Pulmonary/Chest: No stridor. No respiratory distress. She has no wheezes.  Abdominal: Soft. Bowel sounds are normal. She exhibits no distension and no mass. There is no tenderness. There is no rebound and no guarding.  Musculoskeletal: She exhibits no edema and no tenderness.  Lymphadenopathy:    She has no cervical adenopathy.  Neurological: She displays normal reflexes. No cranial nerve deficit. She exhibits normal muscle tone. Coordination normal.  Skin: No rash noted. No erythema.  Psychiatric: She has a normal mood and affect. Her behavior is normal. Judgment and thought content normal.          Assessment & Plan:

## 2013-08-14 NOTE — Patient Instructions (Signed)
Use over-the-counter  "cold" medicines  such as "Tylenol cold" , "Advil cold",  "Mucinex" or" Mucinex D"  for cough and congestion.   Avoid decongestants if you have high blood pressure and use "Afrin" nasal spray for nasal congestion as directed instead. Use" Delsym" or" Robitussin" cough syrup varietis for cough.  You can use plain "Tylenol" or "Advil" for fever, chills and achyness.  Please, make an appointment if you are not better or if you're worse.  

## 2013-08-14 NOTE — Assessment & Plan Note (Signed)
CXR

## 2013-08-16 NOTE — Assessment & Plan Note (Signed)
See meds 

## 2013-10-18 ENCOUNTER — Other Ambulatory Visit: Payer: Self-pay | Admitting: Obstetrics and Gynecology

## 2013-10-18 DIAGNOSIS — N6315 Unspecified lump in the right breast, overlapping quadrants: Secondary | ICD-10-CM

## 2013-10-18 DIAGNOSIS — N6310 Unspecified lump in the right breast, unspecified quadrant: Principal | ICD-10-CM

## 2013-10-29 ENCOUNTER — Other Ambulatory Visit: Payer: Self-pay | Admitting: Obstetrics and Gynecology

## 2013-10-29 ENCOUNTER — Ambulatory Visit
Admission: RE | Admit: 2013-10-29 | Discharge: 2013-10-29 | Disposition: A | Discharge status: Home / Self Care | Payer: 59 | Source: Ambulatory Visit | Attending: Obstetrics and Gynecology | Admitting: Obstetrics and Gynecology

## 2013-10-29 DIAGNOSIS — N6310 Unspecified lump in the right breast, unspecified quadrant: Principal | ICD-10-CM

## 2013-10-29 DIAGNOSIS — N6315 Unspecified lump in the right breast, overlapping quadrants: Secondary | ICD-10-CM

## 2013-10-29 HISTORY — PX: BREAST BIOPSY: SHX20

## 2013-10-30 ENCOUNTER — Other Ambulatory Visit: Payer: Self-pay | Admitting: Obstetrics and Gynecology

## 2013-10-30 DIAGNOSIS — N6310 Unspecified lump in the right breast, unspecified quadrant: Secondary | ICD-10-CM

## 2013-11-08 ENCOUNTER — Encounter (INDEPENDENT_AMBULATORY_CARE_PROVIDER_SITE_OTHER): Payer: Self-pay | Admitting: General Surgery

## 2013-11-08 ENCOUNTER — Ambulatory Visit (INDEPENDENT_AMBULATORY_CARE_PROVIDER_SITE_OTHER): Payer: 59 | Admitting: General Surgery

## 2013-11-08 ENCOUNTER — Other Ambulatory Visit (INDEPENDENT_AMBULATORY_CARE_PROVIDER_SITE_OTHER): Payer: Self-pay | Admitting: General Surgery

## 2013-11-08 VITALS — BP 114/68 | HR 74 | Temp 98.4°F | Ht 64.0 in | Wt 112.2 lb

## 2013-11-08 DIAGNOSIS — N6310 Unspecified lump in the right breast, unspecified quadrant: Secondary | ICD-10-CM

## 2013-11-08 DIAGNOSIS — N63 Unspecified lump in unspecified breast: Secondary | ICD-10-CM

## 2013-11-08 NOTE — Progress Notes (Signed)
Patient ID: Deborah Ortega, female   DOB: 01-14-1966, 48 y.o.   MRN: [id number]  Chief Complaint  Patient presents with  . eval rt breast    HPI Deborah Ortega is a 48 y.o. female.  She is referred by Dr. Miquel Dunn at the breast center of [facility] for evaluation management of a right breast mass, probable fibroadenoma. Dr. Gwendolyn Grant  is her PCP.   The patient has always had lumpy breasts, but has never had a breast biopsy. One month ago she got a new mass in her right breast laterally. She said this was rubbery but not painful. No nipple discharge. Mammograms and ultrasound showed a 2-2.5 cm mass in the right breast at the 9:00 position, BI-RADS category 4. Image guided biopsy shows a biphasic lesion, favoring fibroadenoma, cannot rule out colitis tumor. Post procedure mammogram showed the clip was within the mass. Historically this area is enlarging clinically.  Family history is negative for breast or ovarian cancer.  Comorbidities include benign thyroid nodule followed by Renato Shin. Right parietal meningioma excised 2010 by Dr. Vertell Limber.  she has some slight memory loss. HPI  Past Medical History  Diagnosis Date  . ALLERGIC RHINITIS   . GERD   . 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 MIGRAINE HEADACHE   . RENAL CALCULUS, HX OF 06/25/2007, 02/2013  . THYROID NODULE     Past Surgical History  Procedure Laterality Date  . Cesarean section      x's 2  . Meninogioma resection  07/25/09    Family History  Problem Relation Age of Onset  . Diabetes Other   . Hyperlipidemia Other   . Stroke Other   . Ulcerative colitis Other     Social History History  Substance Use Topics  . Smoking status: Never Smoker   . Smokeless tobacco: Not on file     Comment: Married, lives with spouse and 2 kids. Pt is a Pharmacist, hospital  . Alcohol Use: No    Allergies  Allergen Reactions  . Ibuprofen Rash  . Levofloxacin     Current Outpatient Prescriptions  Medication Sig Dispense Refill  .  benzonatate (TESSALON) 200 MG capsule TAKE 1 CAPSULE (200 MG TOTAL) BY MOUTH 2 (TWO) TIMES DAILY.  60 capsule  0  . cetirizine (ZYRTEC) 10 MG tablet Take 1 tablet (10 mg total) by mouth daily.  30 tablet  11  . escitalopram (LEXAPRO) 10 MG tablet Take 10 mg by mouth daily.      . fluticasone (FLONASE) 50 MCG/ACT nasal spray Place 2 sprays into the nose daily.  16 g  5  . omeprazole (PRILOSEC) 20 MG capsule Take 1 capsule (20 mg total) by mouth daily.  60 capsule  3   No current facility-administered medications for this visit.    Review of Systems Review of Systems  Constitutional: Negative for fever, chills and unexpected weight change.  HENT: Negative for congestion, hearing loss, sore throat, trouble swallowing and voice change.   Eyes: Negative for visual disturbance.  Respiratory: Negative for cough and wheezing.   Cardiovascular: Negative for chest pain, palpitations and leg swelling.  Gastrointestinal: Negative for nausea, vomiting, abdominal pain, diarrhea, constipation, blood in stool, abdominal distention and anal bleeding.  Genitourinary: Negative for hematuria, vaginal bleeding and difficulty urinating.  Musculoskeletal: Negative for arthralgias.  Skin: Negative for rash and wound.  Neurological: Negative for seizures, syncope and headaches.  Hematological: Negative for adenopathy. Does not bruise/bleed easily.  Psychiatric/Behavioral: Negative  for confusion.    Blood pressure 114/68, pulse 74, temperature 98.4 F (36.9 C), temperature source Oral, height 5\' 4"  (1.626 m), weight 112 lb 3.2 oz (50.894 kg).  Physical Exam Physical Exam  Constitutional: She is oriented to person, place, and time. She appears well-developed and well-nourished. No distress.  Cooperative. Vital status normal. Mother is with her throughout the encounter.  HENT:  Head: Normocephalic and atraumatic.  Nose: Nose normal.  Mouth/Throat: No oropharyngeal exudate.  Eyes: Conjunctivae and EOM are  normal. Pupils are equal, round, and reactive to light. Left eye exhibits no discharge. No scleral icterus.  Neck: Neck supple. No JVD present. No tracheal deviation present. No thyromegaly present.  Cardiovascular: Normal rate, regular rhythm, normal heart sounds and intact distal pulses.   No murmur heard. Pulmonary/Chest: Effort normal and breath sounds normal. No respiratory distress. She has no wheezes. She has no rales. She exhibits no tenderness.    Breasts are diffusely lumpy, medium size. Bruise in the right breast at 9:00 position. Slightly lateral to this is a 2 cm rubbery, firm, mobile mass. No other mass or skin change or adenopathy noted.  Abdominal: Soft. Bowel sounds are normal. She exhibits no distension and no mass. There is no tenderness. There is no rebound and no guarding.  Musculoskeletal: She exhibits no edema and no tenderness.  Lymphadenopathy:    She has no cervical adenopathy.  Neurological: She is alert and oriented to person, place, and time. She exhibits normal muscle tone. Coordination normal.  Skin: Skin is warm. No rash noted. She is not diaphoretic. No erythema. No pallor.  Psychiatric: She has a normal mood and affect. Her behavior is normal. Judgment and thought content normal.    Data Reviewed Imaging studies and surgical pathology report  Assessment    2 cm mass right breast, 9:00 position. This is clinically enlarging by history. 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 Phyllodes tumor possible but less likely.  History right parietal meningioma excision 2010  Benign thyroid nodule     Plan    Scheduled for a right lumpectomy with radioactive seed localization.  I discussed the indications, details, techniques, and numerous risk of the surgery with her. She's aware of the risk of bleeding, infection, cosmetic deformity, reoperation if cancer is found, and other unforeseen problems. She understands all these issues and all her questions were answered. She agrees  with this plan.        Edsel Petrin. Dalbert Batman, M.D., [facility] General and Minimally invasive Surgery Breast and Colorectal Surgery Office:   [phone] Pager:   [phone]  11/08/2013, 10:15 AM

## 2013-11-08 NOTE — Patient Instructions (Signed)
The lump in your right breast has been biopsied. It is probably a benign fibroadenoma, but there is a small chance that it may be a locally aggressive tumor called a phyllodes tumor.  Regardless, this is a new finding and it is likely to enlarge.  You will be scheduled for right breast lumpectomy with radioactive seed localization.     Lumpectomy A lumpectomy is a form of "breast conserving" or "breast preservation" surgery. It may also be referred to as a partial mastectomy. During a lumpectomy, the portion of the breast that contains the cancerous tumor or breast mass (the lump) is removed. Some normal tissue around the lump may also be removed to make sure all the tumor has been removed. This surgery should take 40 minutes or less. LET [facility] CARE PROVIDER KNOW ABOUT:  Any allergies you have.  All medicines you are taking, including vitamins, herbs, eye drops, creams, and over-the-counter medicines.  Previous problems you or members of your family have had with the use of anesthetics.  Any blood disorders you have.  Previous surgeries you have had.  Medical conditions you have. RISKS AND COMPLICATIONS Generally, this is a safe procedure. However, as with any procedure, complications can occur. Possible complications include:  Bleeding.  Infection.  Pain.  Temporary swelling.  Change in the shape of the breast, particularly if a large portion is removed. BEFORE THE PROCEDURE  Ask your health care provider about changing or stopping your regular medicines.  Do not eat or drink anything for 7 8 hours before the surgery or as directed by your health care provider. Ask your health care provider if you can take a sip of water with any approved medicines.  On the day of surgery, your healthcare provider will use a mammogram or ultrasound to locate and mark the tumor in your breast. These markings on your breast will show where the cut (incision) will be made. PROCEDURE    An IV tube will be put into one of your veins.  You may be given medicine to help you relax before the surgery (sedative). You will be given one of the following:  A medicine that numbs the area (local anesthesia).  A medicine that makes you go to sleep (general anesthesia).  Your health care provider will use a kind of electric scalpel that uses heat to minimize bleeding (electrocautery knife).  A curved incision (like a smile or frown) that follows the natural curve of your breast is made, to allow for minimal scarring and better healing.  The tumor will be removed with some of the surrounding tissue. This will be sent to the lab for analysis. Your health care provider may also remove your lymph nodes at this time if needed.  Sometimes, but not always, a rubber tube called a drain will be surgically inserted into your breast area or armpit to collect excess fluid that may accumulate in the space where the tumor was. This drain is connected to a plastic bulb on the outside of your body. This drain creates suction to help remove the fluid.  The incisions will be closed with stitches (sutures).  A bandage may be placed over the incisions. AFTER THE PROCEDURE  You will be taken to the recovery area.  You will be given medicine for pain.  A small rubber drain may be placed in the breast for 2 3 days to prevent a collection of blood (hematoma) from developing in the breast. You will be given instructions on caring  for the drain before you go home.  A pressure bandage (dressing) will be applied for 1 2 days to prevent bleeding. Ask your health care provider how to care for your bandage at home. Document Released: 09/13/2006 Document Revised: 04/04/2013 Document Reviewed: 01/05/2013 [facility] Patient Information 2014 [place].

## 2013-11-13 ENCOUNTER — Ambulatory Visit
Admission: RE | Admit: 2013-11-13 | Discharge: 2013-11-13 | Disposition: A | Discharge status: Home / Self Care | Payer: 59 | Source: Ambulatory Visit | Attending: General Surgery | Admitting: General Surgery

## 2013-11-13 ENCOUNTER — Encounter (HOSPITAL_BASED_OUTPATIENT_CLINIC_OR_DEPARTMENT_OTHER): Payer: Self-pay | Admitting: *Deleted

## 2013-11-13 ENCOUNTER — Other Ambulatory Visit (INDEPENDENT_AMBULATORY_CARE_PROVIDER_SITE_OTHER): Payer: Self-pay | Admitting: General Surgery

## 2013-11-13 DIAGNOSIS — N6310 Unspecified lump in the right breast, unspecified quadrant: Secondary | ICD-10-CM

## 2013-11-13 NOTE — Progress Notes (Signed)
To come in for [facility] labs-no cardiac or resp problems

## 2013-11-14 ENCOUNTER — Encounter (HOSPITAL_BASED_OUTPATIENT_CLINIC_OR_DEPARTMENT_OTHER)
Admission: RE | Admit: 2013-11-14 | Discharge: 2013-11-14 | Disposition: A | Discharge status: Home / Self Care | Payer: 59 | Source: Ambulatory Visit | Attending: General Surgery | Admitting: General Surgery

## 2013-11-14 ENCOUNTER — Ambulatory Visit
Admission: RE | Admit: 2013-11-14 | Discharge: 2013-11-14 | Disposition: A | Discharge status: Home / Self Care | Payer: 59 | Source: Ambulatory Visit | Attending: General Surgery | Admitting: General Surgery

## 2013-11-14 DIAGNOSIS — Z01812 Encounter for preprocedural laboratory examination: Secondary | ICD-10-CM | POA: Insufficient documentation

## 2013-11-14 LAB — CBC WITH DIFFERENTIAL/PLATELET
BASOS PCT: 1 % (ref 0–1)
Basophils Absolute: 0 10*3/uL (ref 0.0–0.1)
Eosinophils Absolute: 0.3 10*3/uL (ref 0.0–0.7)
Eosinophils Relative: 4 % (ref 0–5)
HCT: 32.9 % — ABNORMAL LOW (ref 36.0–46.0)
Hemoglobin: 10.5 g/dL — ABNORMAL LOW (ref 12.0–15.0)
Lymphocytes Relative: 33 % (ref 12–46)
Lymphs Abs: 2.2 10*3/uL (ref 0.7–4.0)
MCH: 25.2 pg — AB (ref 26.0–34.0)
MCHC: 31.9 g/dL (ref 30.0–36.0)
MCV: 78.9 fL (ref 78.0–100.0)
Monocytes Absolute: 0.5 10*3/uL (ref 0.1–1.0)
Monocytes Relative: 8 % (ref 3–12)
NEUTROS PCT: 54 % (ref 43–77)
Neutro Abs: 3.6 10*3/uL (ref 1.7–7.7)
PLATELETS: 286 10*3/uL (ref 150–400)
RBC: 4.17 MIL/uL (ref 3.87–5.11)
RDW: 14.6 % (ref 11.5–15.5)
WBC: 6.6 10*3/uL (ref 4.0–10.5)

## 2013-11-14 LAB — COMPREHENSIVE METABOLIC PANEL
ALBUMIN: 3.9 g/dL (ref 3.5–5.2)
ALK PHOS: 51 U/L (ref 39–117)
ALT: 9 U/L (ref 0–35)
AST: 15 U/L (ref 0–37)
BUN: 11 mg/dL (ref 6–23)
CO2: 23 mEq/L (ref 19–32)
Calcium: 8.5 mg/dL (ref 8.4–10.5)
Chloride: 104 mEq/L (ref 96–112)
Creatinine, Ser: 0.59 mg/dL (ref 0.50–1.10)
GFR calc Af Amer: >90 mL/min (ref >90)
GFR calc non Af Amer: >90 mL/min (ref >90)
Glucose, Bld: 89 mg/dL (ref 70–99)
Potassium: 4.1 mEq/L (ref 3.7–5.3)
Sodium: 139 mEq/L (ref 137–147)
TOTAL PROTEIN: 6.8 g/dL (ref 6.0–8.3)
Total Bilirubin: <0.2 mg/dL — ABNORMAL LOW (ref 0.3–1.2)

## 2013-11-16 NOTE — H&P (Signed)
[name]   MRN:  [id number]   Description: 48 year old female  Provider: Adin Hector, MD  Department: [facility]            Diagnoses      Breast mass, right    -  Primary      611.72              Current Vitals - Last Recorded      BP Pulse Temp(Src) Ht Wt BMI      114/68 74 98.4 F (36.9 C) (Oral) 5\' 4"  (1.626 m) 112 lb 3.2 oz (50.894 kg) 19.25 kg/m2            LMP  10/29/2013                             History and Physical     Adin Hector, MD    Status: Signed            Patient ID: Deborah Ortega, female   DOB: [birth year], 48 y.o.   MRN: [id number]              HPI Deborah Ortega is a 48 y.o. female.  She is referred by Dr. Miquel Dunn at the breast center of [facility] for evaluation management of a right breast mass, probable fibroadenoma. Dr. Gwendolyn Grant  is her PCP.    The patient has always had lumpy breasts, but has never had a breast biopsy. One month ago she got a new mass in her right breast laterally. She said this was rubbery but not painful. No nipple discharge. Mammograms and ultrasound showed a 2-2.5 cm mass in the right breast at the 9:00 position, BI-RADS category 4. Image guided biopsy shows a biphasic lesion, favoring fibroadenoma, cannot rule out colitis tumor. Post procedure mammogram showed the clip was within the mass. Historically this area is enlarging clinically.   Family history is negative for breast or ovarian cancer.   Comorbidities include benign thyroid nodule followed by Renato Shin. Right parietal meningioma excised 2010 by Dr. Vertell Limber.  she has some slight memory loss.        Past Medical History   Diagnosis  Date   .  ALLERGIC RHINITIS     .  GERD     .  MENINGIOMA         s/p resction 07/2009   .  MIGRAINE HEADACHE     .  RENAL CALCULUS, HX OF  06/25/2007, 02/2013   .  THYROID NODULE           Past Surgical History   Procedure  Laterality  Date   .  Cesarean section            x's 2   .  Meninogioma resection    07/25/09         Family History   Problem  Relation  Age of Onset   .  Diabetes  Other     .  Hyperlipidemia  Other     .  Stroke  Other     .  Ulcerative colitis  Other          Social History History   Substance Use Topics   .  Smoking status:  Never Smoker    .  Smokeless tobacco:  Not on file         Comment: Married, lives with  spouse and 2 kids. Pt is a Pharmacist, hospital   .  Alcohol Use:  No         Allergies   Allergen  Reactions   .  Ibuprofen  Rash   .  Levofloxacin           Current Outpatient Prescriptions   Medication  Sig  Dispense  Refill   .  benzonatate (TESSALON) 200 MG capsule  TAKE 1 CAPSULE (200 MG TOTAL) BY MOUTH 2 (TWO) TIMES DAILY.   60 capsule   0   .  cetirizine (ZYRTEC) 10 MG tablet  Take 1 tablet (10 mg total) by mouth daily.   30 tablet   11   .  escitalopram (LEXAPRO) 10 MG tablet  Take 10 mg by mouth daily.         .  fluticasone (FLONASE) 50 MCG/ACT nasal spray  Place 2 sprays into the nose daily.   16 g   5   .  omeprazole (PRILOSEC) 20 MG capsule  Take 1 capsule (20 mg total) by mouth daily.   60 capsule   3         Review of Systems  Constitutional: Negative for fever, chills and unexpected weight change.  HENT: Negative for congestion, hearing loss, sore throat, trouble swallowing and voice change.   Eyes: Negative for visual disturbance.  Respiratory: Negative for cough and wheezing.   Cardiovascular: Negative for chest pain, palpitations and leg swelling.  Gastrointestinal: Negative for nausea, vomiting, abdominal pain, diarrhea, constipation, blood in stool, abdominal distention and anal bleeding.  Genitourinary: Negative for hematuria, vaginal bleeding and difficulty urinating.  Musculoskeletal: Negative for arthralgias.  Skin: Negative for rash and wound.  Neurological: Negative for seizures, syncope and headaches.  Hematological: Negative for adenopathy. Does not bruise/bleed easily.   Psychiatric/Behavioral: Negative for confusion.      Blood pressure 114/68, pulse 74, temperature 98.4 F (36.9 C), temperature source Oral, height 5\' 4"  (1.626 m), weight 112 lb 3.2 oz (50.894 kg).   Physical Exam  Constitutional: She is oriented to person, place, and time. She appears well-developed and well-nourished. No distress.  Cooperative. Vital status normal. Mother is with her throughout the encounter.  HENT:   Head: Normocephalic and atraumatic.   Nose: Nose normal.   Mouth/Throat: No oropharyngeal exudate.  Eyes: Conjunctivae and EOM are normal. Pupils are equal, round, and reactive to light. Left eye exhibits no discharge. No scleral icterus.  Neck: Neck supple. No JVD present. No tracheal deviation present. No thyromegaly present.  Cardiovascular: Normal rate, regular rhythm, normal heart sounds and intact distal pulses.    No murmur heard. Pulmonary/Chest: Effort normal and breath sounds normal. No respiratory distress. She has no wheezes. She has no rales. She exhibits no tenderness.    Breasts are diffusely lumpy, medium size. Bruise in the right breast at 9:00 position. Slightly lateral to this is a 2 cm rubbery, firm, mobile mass. No other mass or skin change or adenopathy noted.  Abdominal: Soft. Bowel sounds are normal. She exhibits no distension and no mass. There is no tenderness. There is no rebound and no guarding.  Musculoskeletal: She exhibits no edema and no tenderness.  Lymphadenopathy:    She has no cervical adenopathy.  Neurological: She is alert and oriented to person, place, and time. She exhibits normal muscle tone. Coordination normal.  Skin: Skin is warm. No rash noted. She is not diaphoretic. No erythema. No pallor.  Psychiatric: She has a normal mood and  affect. Her behavior is normal. Judgment and thought content normal.      Data Reviewed Imaging studies and surgical pathology report   Assessment    2 cm mass right breast, 9:00  position. This is clinically enlarging by history. 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 Phyllodes tumor possible but less likely.   History right parietal meningioma excision 2010   Benign thyroid nodule      Plan    Scheduled for a right lumpectomy with radioactive seed localization.   I discussed the indications, details, techniques, and numerous risk of the surgery with her. She's aware of the risk of bleeding, infection, cosmetic deformity, reoperation if cancer is found, and other unforeseen problems. She understands all these issues and all her questions were answered. She agrees with this plan.         Edsel Petrin. Dalbert Batman, M.D., [facility] General and Minimally invasive Surgery Breast and Colorectal Surgery Office:   [phone] Pager:   [phone]

## 2013-11-19 ENCOUNTER — Ambulatory Visit
Admission: RE | Admit: 2013-11-19 | Discharge: 2013-11-19 | Disposition: A | Discharge status: Home / Self Care | Payer: 59 | Source: Ambulatory Visit | Attending: General Surgery | Admitting: General Surgery

## 2013-11-19 ENCOUNTER — Encounter (HOSPITAL_BASED_OUTPATIENT_CLINIC_OR_DEPARTMENT_OTHER): Payer: 59 | Admitting: Anesthesiology

## 2013-11-19 ENCOUNTER — Ambulatory Visit (HOSPITAL_BASED_OUTPATIENT_CLINIC_OR_DEPARTMENT_OTHER): Payer: 59 | Admitting: Anesthesiology

## 2013-11-19 ENCOUNTER — Ambulatory Visit (HOSPITAL_BASED_OUTPATIENT_CLINIC_OR_DEPARTMENT_OTHER)
Admission: RE | Admit: 2013-11-19 | Discharge: 2013-11-19 | Disposition: A | Discharge status: Home / Self Care | Payer: 59 | Source: Ambulatory Visit | Attending: General Surgery | Admitting: General Surgery

## 2013-11-19 ENCOUNTER — Encounter (HOSPITAL_BASED_OUTPATIENT_CLINIC_OR_DEPARTMENT_OTHER)
Admission: RE | Disposition: A | Discharge status: Home / Self Care | Payer: Self-pay | Source: Ambulatory Visit | Attending: General Surgery

## 2013-11-19 ENCOUNTER — Encounter (HOSPITAL_BASED_OUTPATIENT_CLINIC_OR_DEPARTMENT_OTHER): Payer: Self-pay

## 2013-11-19 DIAGNOSIS — D249 Benign neoplasm of unspecified breast: Secondary | ICD-10-CM | POA: Insufficient documentation

## 2013-11-19 DIAGNOSIS — K219 Gastro-esophageal reflux disease without esophagitis: Secondary | ICD-10-CM | POA: Insufficient documentation

## 2013-11-19 DIAGNOSIS — E041 Nontoxic single thyroid nodule: Secondary | ICD-10-CM | POA: Insufficient documentation

## 2013-11-19 DIAGNOSIS — J309 Allergic rhinitis, unspecified: Secondary | ICD-10-CM | POA: Insufficient documentation

## 2013-11-19 DIAGNOSIS — G43909 Migraine, unspecified, not intractable, without status migrainosus: Secondary | ICD-10-CM | POA: Insufficient documentation

## 2013-11-19 DIAGNOSIS — N6310 Unspecified lump in the right breast, unspecified quadrant: Secondary | ICD-10-CM

## 2013-11-19 HISTORY — DX: Unspecified hearing loss, unspecified ear: H91.90

## 2013-11-19 HISTORY — DX: Nausea with vomiting, unspecified: R11.2

## 2013-11-19 HISTORY — DX: Other specified postprocedural states: Z98.890

## 2013-11-19 HISTORY — PX: BREAST LUMPECTOMY WITH RADIOACTIVE SEED LOCALIZATION: SHX6424

## 2013-11-19 SURGERY — BREAST LUMPECTOMY WITH RADIOACTIVE SEED LOCALIZATION
Anesthesia: General | Site: Breast | Laterality: Right

## 2013-11-19 MED ORDER — SUCCINYLCHOLINE CHLORIDE 20 MG/ML IJ SOLN
INTRAMUSCULAR | Status: DC | PRN
  Administered 2013-11-19: 100 mg via INTRAVENOUS

## 2013-11-19 MED ORDER — LIDOCAINE HCL (CARDIAC) 20 MG/ML IV SOLN
INTRAVENOUS | Status: DC | PRN
  Administered 2013-11-19: 50 mg via INTRAVENOUS

## 2013-11-19 MED ORDER — DEXAMETHASONE SODIUM PHOSPHATE 4 MG/ML IJ SOLN
INTRAMUSCULAR | Status: DC | PRN
Start: 2013-11-19 — End: 2013-11-19
  Administered 2013-11-19: 10 mg via INTRAVENOUS

## 2013-11-19 MED ORDER — FENTANYL CITRATE 0.05 MG/ML IJ SOLN
INTRAMUSCULAR | Status: DC | PRN
  Administered 2013-11-19: 100 ug via INTRAVENOUS

## 2013-11-19 MED ORDER — SCOPOLAMINE 1 MG/3DAYS TD PT72
1.0000 | MEDICATED_PATCH | TRANSDERMAL | Status: DC
  Administered 2013-11-19: 1.5 mg via TRANSDERMAL

## 2013-11-19 MED ORDER — ONDANSETRON HCL 4 MG/2ML IJ SOLN: 4.0000 mg | Freq: Once | INTRAMUSCULAR | Status: DC | PRN

## 2013-11-19 MED ORDER — PROPOFOL 10 MG/ML IV BOLUS
INTRAVENOUS | Status: DC | PRN
  Administered 2013-11-19: 200 mg via INTRAVENOUS

## 2013-11-19 MED ORDER — CEFAZOLIN SODIUM-DEXTROSE 2-3 GM-% IV SOLR
INTRAVENOUS | Status: AC
  Filled 2013-11-19: qty 50

## 2013-11-19 MED ORDER — BUPIVACAINE-EPINEPHRINE PF 0.5-1:200000 % IJ SOLN
INTRAMUSCULAR | Status: AC
  Filled 2013-11-19: qty 30

## 2013-11-19 MED ORDER — MIDAZOLAM HCL 2 MG/2ML IJ SOLN
INTRAMUSCULAR | Status: AC
  Filled 2013-11-19: qty 2

## 2013-11-19 MED ORDER — HYDROCODONE-ACETAMINOPHEN 5-325 MG PO TABS: 1.0000 | ORAL_TABLET | Freq: Four times a day (QID) | ORAL | Status: DC | PRN

## 2013-11-19 MED ORDER — HYDROMORPHONE HCL PF 1 MG/ML IJ SOLN: 0.2500 mg | INTRAMUSCULAR | Status: DC | PRN

## 2013-11-19 MED ORDER — CEFAZOLIN SODIUM-DEXTROSE 2-3 GM-% IV SOLR
2.0000 g | INTRAVENOUS | Status: AC
  Administered 2013-11-19: 2 g via INTRAVENOUS

## 2013-11-19 MED ORDER — CHLORHEXIDINE GLUCONATE 4 % EX LIQD: 1.0000 "application " | Freq: Once | CUTANEOUS | Status: DC

## 2013-11-19 MED ORDER — BUPIVACAINE-EPINEPHRINE PF 0.25-1:200000 % IJ SOLN
INTRAMUSCULAR | Status: DC | PRN
  Administered 2013-11-19: 8 mL

## 2013-11-19 MED ORDER — MIDAZOLAM HCL 5 MG/5ML IJ SOLN
INTRAMUSCULAR | Status: DC | PRN
  Administered 2013-11-19: 2 mg via INTRAVENOUS

## 2013-11-19 MED ORDER — SCOPOLAMINE 1 MG/3DAYS TD PT72
MEDICATED_PATCH | TRANSDERMAL | Status: AC
  Filled 2013-11-19: qty 1

## 2013-11-19 MED ORDER — FENTANYL CITRATE 0.05 MG/ML IJ SOLN
INTRAMUSCULAR | Status: AC
  Filled 2013-11-19: qty 6

## 2013-11-19 MED ORDER — FENTANYL CITRATE 0.05 MG/ML IJ SOLN: 50.0000 ug | INTRAMUSCULAR | Status: DC | PRN

## 2013-11-19 MED ORDER — 0.9 % SODIUM CHLORIDE (POUR BTL) OPTIME
TOPICAL | Status: DC | PRN
  Administered 2013-11-19: 200 mL

## 2013-11-19 MED ORDER — OXYCODONE HCL 5 MG PO TABS: 5.0000 mg | ORAL_TABLET | Freq: Once | ORAL | Status: DC | PRN

## 2013-11-19 MED ORDER — OXYCODONE HCL 5 MG/5ML PO SOLN: 5.0000 mg | Freq: Once | ORAL | Status: DC | PRN

## 2013-11-19 MED ORDER — LACTATED RINGERS IV SOLN
INTRAVENOUS | Status: DC
  Administered 2013-11-19: 10:00:00 via INTRAVENOUS

## 2013-11-19 MED ORDER — MIDAZOLAM HCL 2 MG/2ML IJ SOLN: 1.0000 mg | INTRAMUSCULAR | Status: DC | PRN

## 2013-11-19 SURGICAL SUPPLY — 69 items
ADH SKN CLS APL DERMABOND .7 (GAUZE/BANDAGES/DRESSINGS) ×1
APL SKNCLS STERI-STRIP NONHPOA (GAUZE/BANDAGES/DRESSINGS)
APPLIER CLIP 9.375 MED OPEN (MISCELLANEOUS)
APR CLP MED 9.3 20 MLT OPN (MISCELLANEOUS)
BENZOIN TINCTURE PRP APPL 2/3 (GAUZE/BANDAGES/DRESSINGS) IMPLANT
BINDER BREAST LRG (GAUZE/BANDAGES/DRESSINGS) IMPLANT
BINDER BREAST MEDIUM (GAUZE/BANDAGES/DRESSINGS) IMPLANT
BINDER BREAST XLRG (GAUZE/BANDAGES/DRESSINGS) IMPLANT
BINDER BREAST XXLRG (GAUZE/BANDAGES/DRESSINGS) IMPLANT
BLADE HEX COATED 2.75 (ELECTRODE) ×3 IMPLANT
BLADE SURG 10 STRL SS (BLADE) IMPLANT
BLADE SURG 15 STRL LF DISP TIS (BLADE) ×1 IMPLANT
BLADE SURG 15 STRL SS (BLADE) ×3
CANISTER SUC SOCK COL 7IN (MISCELLANEOUS) ×3 IMPLANT
CANISTER SUCT 1200ML W/VALVE (MISCELLANEOUS) ×3 IMPLANT
CHLORAPREP W/TINT 26ML (MISCELLANEOUS) ×3 IMPLANT
CLIP APPLIE 9.375 MED OPEN (MISCELLANEOUS) ×1 IMPLANT
CLOSURE WOUND 1/2 X4 (GAUZE/BANDAGES/DRESSINGS)
COVER MAYO STAND STRL (DRAPES) ×3 IMPLANT
COVER PROBE W GEL 5X96 (DRAPES) ×3 IMPLANT
COVER TABLE BACK 60X90 (DRAPES) ×3 IMPLANT
DECANTER SPIKE VIAL GLASS SM (MISCELLANEOUS) IMPLANT
DERMABOND ADVANCED (GAUZE/BANDAGES/DRESSINGS) ×2
DERMABOND ADVANCED .7 DNX12 (GAUZE/BANDAGES/DRESSINGS) ×1 IMPLANT
DEVICE DUBIN W/COMP PLATE 8390 (MISCELLANEOUS) ×3 IMPLANT
DRAIN CHANNEL 19F RND (DRAIN) IMPLANT
DRAPE LAPAROSCOPIC ABDOMINAL (DRAPES) ×1 IMPLANT
DRAPE UTILITY XL STRL (DRAPES) ×3 IMPLANT
DRSG PAD ABDOMINAL 8X10 ST (GAUZE/BANDAGES/DRESSINGS) IMPLANT
ELECT REM PT RETURN 9FT ADLT (ELECTROSURGICAL) ×3
ELECTRODE REM PT RTRN 9FT ADLT (ELECTROSURGICAL) ×1 IMPLANT
EVACUATOR SILICONE 100CC (DRAIN) IMPLANT
GLOVE BIOGEL PI IND STRL 7.0 (GLOVE) IMPLANT
GLOVE BIOGEL PI INDICATOR 7.0 (GLOVE) ×2
GLOVE EUDERMIC 7 POWDERFREE (GLOVE) ×1 IMPLANT
GLOVE EXAM NITRILE MD LF STRL (GLOVE) ×2 IMPLANT
GLOVE SURG SS PI 6.5 STRL IVOR (GLOVE) ×2 IMPLANT
GLOVE SURG SS PI 7.0 STRL IVOR (GLOVE) ×2 IMPLANT
GOWN STRL REUS W/ TWL LRG LVL3 (GOWN DISPOSABLE) ×1 IMPLANT
GOWN STRL REUS W/ TWL XL LVL3 (GOWN DISPOSABLE) ×1 IMPLANT
GOWN STRL REUS W/TWL LRG LVL3 (GOWN DISPOSABLE) ×3
GOWN STRL REUS W/TWL XL LVL3 (GOWN DISPOSABLE) ×3
KIT MARKER MARGIN INK (KITS) ×2 IMPLANT
NDL HYPO 25X1 1.5 SAFETY (NEEDLE) IMPLANT
NEEDLE HYPO 25X1 1.5 SAFETY (NEEDLE) ×3 IMPLANT
NS IRRIG 1000ML POUR BTL (IV SOLUTION) ×3 IMPLANT
PACK BASIN DAY SURGERY FS (CUSTOM PROCEDURE TRAY) ×3 IMPLANT
PENCIL BUTTON HOLSTER BLD 10FT (ELECTRODE) ×3 IMPLANT
PIN SAFETY STERILE (MISCELLANEOUS) ×1 IMPLANT
SHEET MEDIUM DRAPE 40X70 STRL (DRAPES) IMPLANT
SLEEVE SCD COMPRESS KNEE MED (MISCELLANEOUS) ×3 IMPLANT
SPONGE GAUZE 4X4 12PLY STER LF (GAUZE/BANDAGES/DRESSINGS) IMPLANT
SPONGE LAP 18X18 X RAY DECT (DISPOSABLE) IMPLANT
SPONGE LAP 4X18 X RAY DECT (DISPOSABLE) ×3 IMPLANT
STRIP CLOSURE SKIN 1/2X4 (GAUZE/BANDAGES/DRESSINGS) IMPLANT
SUT ETHILON 3 0 FSL (SUTURE) IMPLANT
SUT MNCRL AB 4-0 PS2 18 (SUTURE) ×3 IMPLANT
SUT SILK 2 0 SH (SUTURE) ×3 IMPLANT
SUT VIC AB 2-0 CT1 27 (SUTURE)
SUT VIC AB 2-0 CT1 TAPERPNT 27 (SUTURE) IMPLANT
SUT VIC AB 3-0 SH 27 (SUTURE)
SUT VIC AB 3-0 SH 27X BRD (SUTURE) IMPLANT
SUT VICRYL 3-0 CR8 SH (SUTURE) ×3 IMPLANT
SYR CONTROL 10ML LL (SYRINGE) ×2 IMPLANT
TOWEL OR 17X24 6PK STRL BLUE (TOWEL DISPOSABLE) ×3 IMPLANT
TOWEL OR NON WOVEN STRL DISP B (DISPOSABLE) ×3 IMPLANT
TUBE CONNECTING 20'X1/4 (TUBING) ×1
TUBE CONNECTING 20X1/4 (TUBING) ×2 IMPLANT
YANKAUER SUCT BULB TIP NO VENT (SUCTIONS) ×3 IMPLANT

## 2013-11-19 NOTE — Transfer of Care (Signed)
Immediate Anesthesia Transfer of Care Note  Patient: Deborah Ortega  Procedure(s) Performed: Procedure(s): BREAST LUMPECTOMY WITH RADIOACTIVE SEED LOCALIZATION (Right)  Patient Location: PACU  Anesthesia Type:General  Level of Consciousness: awake  Airway & Oxygen Therapy: Patient Spontanous Breathing and Patient connected to face mask oxygen  Post-op Assessment: Report given to PACU RN and Post -op Vital signs reviewed and stable  Post vital signs: Reviewed and stable  Complications: No apparent anesthesia complications

## 2013-11-19 NOTE — Anesthesia Preprocedure Evaluation (Addendum)
Anesthesia Evaluation  Patient identified by MRN, date of birth, ID band Patient awake    Reviewed: Allergy & Precautions, H&P , NPO status , Patient's Chart, lab work & pertinent test results  History of Anesthesia Complications (+) PONV  Airway Mallampati: I TM Distance: >3 FB Neck ROM: Full    Dental  (+) Teeth Intact, Dental Advisory Given   Pulmonary  breath sounds clear to auscultation        Cardiovascular Rhythm:Regular Rate:Normal     Neuro/Psych    GI/Hepatic GERD-  Medicated and Controlled,  Endo/Other    Renal/GU      Musculoskeletal   Abdominal   Peds  Hematology   Anesthesia Other Findings Pt feels sick with non-productive cough and slight nausea. Temp of 100.2.  Discussed with Dr. Dalbert Batman and we decided to proceed with seed implant being in place.  Reproductive/Obstetrics                          Anesthesia Physical Anesthesia Plan  ASA: II  Anesthesia Plan: General   Post-op Pain Management:    Induction: Intravenous  Airway Management Planned: Oral ETT  Additional Equipment:   Intra-op Plan:   Post-operative Plan: Extubation in OR  Informed Consent: I have reviewed the patients History and Physical, chart, labs and discussed the procedure including the risks, benefits and alternatives for the proposed anesthesia with the patient or authorized representative who has indicated his/her understanding and acceptance.   Dental advisory given  Plan Discussed with: CRNA, Anesthesiologist and Surgeon  Anesthesia Plan Comments:         Anesthesia Quick Evaluation

## 2013-11-19 NOTE — Anesthesia Procedure Notes (Signed)
Procedure Name: Intubation Date/Time: 11/19/2013 10:07 AM Performed by: Melynda Ripple D Pre-anesthesia Checklist: Patient identified, Emergency Drugs available, Suction available and Patient being monitored Patient Re-evaluated:Patient Re-evaluated prior to inductionOxygen Delivery Method: Circle System Utilized Preoxygenation: Pre-oxygenation with 100% oxygen Intubation Type: IV induction, Cricoid Pressure applied and Rapid sequence Ventilation: Mask ventilation without difficulty Laryngoscope Size: Mac and 3 Grade View: Grade I Tube type: Oral Number of attempts: 1 Airway Equipment and Method: stylet and oral airway Placement Confirmation: ETT inserted through vocal cords under direct vision,  positive ETCO2 and breath sounds checked- equal and bilateral Secured at: 22 cm Tube secured with: Tape Dental Injury: Teeth and Oropharynx as per pre-operative assessment

## 2013-11-19 NOTE — Interval H&P Note (Signed)
History and Physical Interval Note:  11/19/2013 9:24 AM  Deborah Ortega  has presented today for surgery, with the diagnosis of right breast biphasic lesion   The various methods of treatment have been discussed with the patient and family. After consideration of risks, benefits and other options for treatment, the patient has consented to  Procedure(s): BREAST LUMPECTOMY WITH RADIOACTIVE SEED LOCALIZATION (Right) as a surgical intervention .  The patient's history has been reviewed, patient examined, no change in status, stable for surgery.  I have reviewed the patient's chart and labs.  Questions were answered to the patient's satisfaction.     Adin Hector

## 2013-11-19 NOTE — Anesthesia Postprocedure Evaluation (Signed)
  Anesthesia Post-op Note  Patient: Deborah Ortega  Procedure(s) Performed: Procedure(s): BREAST LUMPECTOMY WITH RADIOACTIVE SEED LOCALIZATION (Right)  Patient Location: PACU  Anesthesia Type:General  Level of Consciousness: awake, alert  and oriented  Airway and Oxygen Therapy: Patient Spontanous Breathing  Post-op Pain: mild  Post-op Assessment: Post-op Vital signs reviewed  Post-op Vital Signs: Reviewed  Complications: No apparent anesthesia complications

## 2013-11-19 NOTE — Op Note (Signed)
Patient Name:           Deborah Ortega   Date of Surgery:        11/19/2013  Pre op Diagnosis:      Biphasic lesion, right breast, 9:00 position, rule out phyllodes tumor.  Post op Diagnosis:    Same  Procedure:                 Right partial mastectomy with radioactive seed localization and margin assessment  Surgeon:                     Edsel Petrin. Dalbert Batman, M.D., FACS  Assistant:                      None  Operative Indications:   [name] is a 48 y.o. female. She is referred by Dr. Miquel Dunn at the breast center of [facility] for evaluation management of a right breast mass, probable fibroadenoma. Dr. Gwendolyn Grant is her PCP.  The patient has always had lumpy breasts, but has never had a breast biopsy. One month ago she noticed a new mass in her right breast laterally. She said this was rubbery but not painful. No nipple discharge. Mammograms and ultrasound showed a 2-2.5 cm mass in the right breast at the 9:00 position, BI-RADS category 4. Image guided biopsy shows a biphasic lesion, favoring fibroadenoma, cannot rule out phyllodes tumor. Post procedure mammogram showed the clip was within the mass. Historically this area is enlarging clinically.  Family history is negative for breast or ovarian cancer.  Comorbidities include benign thyroid nodule followed by Renato Shin. Right parietal meningioma excised 2010 by Dr. Vertell Limber. she has some slight memory loss.   Operative Findings:       The titanium marker clip and the radioactive seed were observed within the specimen mammogram. There was a single seed. Once the specimen was removed there was no remaining radioactivity with the breast. All the radioactivity was within the lumpectomy specimen. The specimen contained the palpable mass, as well.  Procedure in Detail:          The radioactive seed was placed at the breast center of [facility] last week. It was placed immediately adjacent to the titanium marker clip. The patient was brought to  the operating room at [facility] and underwent general anesthesia. The right breast was prepped and draped in a sterile fashion. Intravenous antibiotics were given. Surgical time out was performed. Using the neoprobe isolated area of intense radioactivity which was in the far lateral 9:00 position of the right breast. This was in the area of the palpable mass. 0.5% Marcaine with epinephrine was used as local infiltration anesthetic. A transverse radially oriented incision was made. Dissection was carried down into the breast tissue around the radioactivity. The neoprobe was used  Frequently  to guide the direction of the dissection. The specimen was removed and marked with silk sutures to mark the 3 cardinal positions to orient  the pathologist. All the radioactivity was within the specimen and none remained in the breast. Specimen mammogram showed the titanium marker and seed  to be contained. Specimen was marked with pins and sent to the lab. Hemostasis was excellent and achieved with electrocautery. It was irrigated with  saline. The deeper breast tissues were closed in layers with interrupted sutures of 3-0 Vicryl and the skin closed with a running subcuticular suture for Monocryl and Dermabond. Ice pack was placed and  the patient taken to PACU in stable condition. EBL 10 cc. Counts correct. Complications none.     Edsel Petrin. Dalbert Batman, M.D., FACS General and Minimally Invasive Surgery Breast and Colorectal Surgery  11/19/2013 10:41 AM 2

## 2013-11-19 NOTE — Discharge Instructions (Signed)
[facility] °Office Phone Number [phone] ° °BREAST BIOPSY/ PARTIAL MASTECTOMY: POST OP INSTRUCTIONS ° °Always review your discharge instruction sheet given to you by the facility where your surgery was performed. ° °IF YOU HAVE DISABILITY OR FAMILY LEAVE FORMS, YOU MUST BRING THEM TO THE OFFICE FOR PROCESSING.  DO NOT GIVE THEM TO YOUR DOCTOR. ° °1. A prescription for pain medication may be given to you upon discharge.  Take your pain medication as prescribed, if needed.  If narcotic pain medicine is not needed, then you may take acetaminophen (Tylenol) or ibuprofen (Advil) as needed. °2. Take your usually prescribed medications unless otherwise directed °3. If you need a refill on your pain medication, please contact your pharmacy.  They will contact our office to request authorization.  Prescriptions will not be filled after 5pm or on week-ends. °4. You should eat very light the first 24 hours after surgery, such as soup, crackers, pudding, etc.  Resume your normal diet the day after surgery. °5. Most patients will experience some swelling and bruising in the breast.  Ice packs and a good support bra will help.  Swelling and bruising can take several days to resolve.  °6. It is common to experience some constipation if taking pain medication after surgery.  Increasing fluid intake and taking a stool softener will usually help or prevent this problem from occurring.  A mild laxative (Milk of Magnesia or Miralax) should be taken according to package directions if there are no bowel movements after 48 hours. °7. Unless discharge instructions indicate otherwise, you may remove your bandages 24-48 hours after surgery, and you may shower at that time.  You may have steri-strips (small skin tapes) in place directly over the incision.  These strips should be left on the skin for 7-10 days.  If your surgeon used skin glue on the incision, you may shower in 24 hours.  The glue will flake off over the  next 2-3 weeks.  Any sutures or staples will be removed at the office during your follow-up visit. °8. ACTIVITIES:  You may resume regular daily activities (gradually increasing) beginning the next day.  Wearing a good support bra or sports bra minimizes pain and swelling.  You may have sexual intercourse when it is comfortable. °a. You may drive when you no longer are taking prescription pain medication, you can comfortably wear a seatbelt, and you can safely maneuver your car and apply brakes. °b. RETURN TO WORK:  ______________________________________________________________________________________ °9. You should see your doctor in the office for a follow-up appointment approximately two weeks after your surgery.  Your doctor’s nurse will typically make your follow-up appointment when she calls you with your pathology report.  Expect your pathology report 2-3 business days after your surgery.  You may call to check if you do not hear from us after three days. °10. OTHER INSTRUCTIONS: _______________________________________________________________________________________________ _____________________________________________________________________________________________________________________________________ °_____________________________________________________________________________________________________________________________________ °_____________________________________________________________________________________________________________________________________ ° °WHEN TO CALL YOUR DOCTOR: °1. Fever over 101.0 °2. Nausea and/or vomiting. °3. Extreme swelling or bruising. °4. Continued bleeding from incision. °5. Increased pain, redness, or drainage from the incision. ° °The clinic staff is available to answer your questions during regular business hours.  Please don’t hesitate to call and ask to speak to one of the nurses for clinical concerns.  If you have a medical emergency, go to the nearest  emergency room or call 911.  A surgeon from [facility] is always on call at the hospital. ° °For further questions, please visit [email or web address]  ° ° °  Post Anesthesia Home Care Instructions ° °Activity: °Get plenty of rest for the remainder of the day. A responsible adult should stay with you for 24 hours following the procedure.  °For the next 24 hours, DO NOT: °-Drive a car °-Operate machinery °-Drink alcoholic beverages °-Take any medication unless instructed by your physician °-Make any legal decisions or sign important papers. ° °Meals: °Start with liquid foods such as gelatin or soup. Progress to regular foods as tolerated. Avoid greasy, spicy, heavy foods. If nausea and/or vomiting occur, drink only clear liquids until the nausea and/or vomiting subsides. Call your physician if vomiting continues. ° °Special Instructions/Symptoms: °Your throat may feel dry or sore from the anesthesia or the breathing tube placed in your throat during surgery. If this causes discomfort, gargle with warm salt water. The discomfort should disappear within 24 hours. ° °

## 2013-11-20 ENCOUNTER — Encounter (HOSPITAL_BASED_OUTPATIENT_CLINIC_OR_DEPARTMENT_OTHER): Payer: Self-pay | Admitting: General Surgery

## 2013-11-20 NOTE — Progress Notes (Signed)
Quick Note:  Inform patient of Pathology report,. Breast biopsy shows benign fibroadenoma. Will discuss at next OV.  HMI ______

## 2013-11-21 ENCOUNTER — Telehealth (INDEPENDENT_AMBULATORY_CARE_PROVIDER_SITE_OTHER): Payer: Self-pay

## 2013-11-21 NOTE — Telephone Encounter (Signed)
Pt notified of path result per Dr Darrel Hoover request. Pt to keep appt 4-22.

## 2013-11-21 NOTE — Telephone Encounter (Signed)
Message copied by Dois Davenport on Wed Nov 21, 2013  9:40 AM ------      Message from: Adin Hector      Created: Tue Nov 20, 2013  9:30 PM       Inform patient of Pathology report,. Breast biopsy shows benign fibroadenoma. Will discuss at next OV.            HMI ------

## 2013-12-01 ENCOUNTER — Other Ambulatory Visit: Payer: Self-pay | Admitting: Internal Medicine

## 2013-12-05 ENCOUNTER — Encounter (INDEPENDENT_AMBULATORY_CARE_PROVIDER_SITE_OTHER): Payer: 59 | Admitting: General Surgery

## 2013-12-18 ENCOUNTER — Telehealth: Payer: Self-pay | Admitting: Internal Medicine

## 2013-12-18 DIAGNOSIS — J45909 Unspecified asthma, uncomplicated: Secondary | ICD-10-CM

## 2013-12-18 NOTE — Telephone Encounter (Signed)
Refer ordered

## 2013-12-18 NOTE — Telephone Encounter (Signed)
Pt would like a referral to pulmonary for asthma.

## 2013-12-19 NOTE — Telephone Encounter (Signed)
Pt is aware that [facility] will call with an appt. °

## 2013-12-24 ENCOUNTER — Institutional Professional Consult (permissible substitution): Payer: 59 | Admitting: Internal Medicine

## 2013-12-26 ENCOUNTER — Ambulatory Visit (INDEPENDENT_AMBULATORY_CARE_PROVIDER_SITE_OTHER): Payer: 59 | Admitting: Internal Medicine

## 2013-12-26 ENCOUNTER — Encounter: Payer: Self-pay | Admitting: Internal Medicine

## 2013-12-26 VITALS — BP 100/60 | HR 86 | Temp 98.9°F | Ht 63.0 in | Wt 115.0 lb

## 2013-12-26 DIAGNOSIS — R059 Cough, unspecified: Secondary | ICD-10-CM

## 2013-12-26 DIAGNOSIS — R05 Cough: Secondary | ICD-10-CM

## 2013-12-26 MED ORDER — PREDNISONE 10 MG PO TABS: ORAL_TABLET | ORAL | Status: DC

## 2013-12-26 MED ORDER — TRAMADOL HCL 50 MG PO TABS: ORAL_TABLET | ORAL | Status: DC

## 2013-12-26 MED ORDER — FAMOTIDINE 20 MG PO TABS: ORAL_TABLET | ORAL | Status: DC

## 2013-12-26 MED ORDER — PANTOPRAZOLE SODIUM 40 MG PO TBEC: 40.0000 mg | DELAYED_RELEASE_TABLET | Freq: Every day | ORAL | Status: DC

## 2013-12-26 NOTE — Patient Instructions (Addendum)
Pantoprazole (protonix) 40 mg   Take 30-60 min before first meal of the day and Pepcid 20 mg one bedtime and chloretrimeton 4 mg take 2 at bedtime  until return to office - this is the best way to tell whether stomach acid is contributing to your problem.    Prednisone 10 mg take  4 each am x 2 days,   2 each am x 2 days,  1 each am x 2 days and stop  Take delsym two tsp every 12 hours and supplement if needed with  tramadol 50 mg up to 2 every 4 hours to suppress the urge to cough. Swallowing water or using ice chips/non mint and menthol containing candies (such as lifesavers or sugarless jolly ranchers) are also effective.  You should rest your voice and avoid activities that you know make you cough.  Once you have eliminated the cough for 3 straight days try reducing the tramadol first,  then the delsym as tolerated.    GERD (REFLUX)  is an extremely common cause of respiratory symptoms, many times with no significant heartburn at all.    It can be treated with medication, but also with lifestyle changes including avoidance of late meals, excessive alcohol, smoking cessation, and avoid fatty foods, chocolate, peppermint, colas, red wine, and acidic juices such as orange juice.  NO MINT OR MENTHOL PRODUCTS SO NO COUGH DROPS  USE SUGARLESS CANDY INSTEAD (jolley ranchers or Stover's)  NO OIL BASED VITAMINS - use powdered substitutes.   Please schedule a follow up office visit in 4 weeks, sooner if needed

## 2013-12-26 NOTE — Progress Notes (Signed)
Subjective:    Patient ID: Deborah Ortega, female    DOB: 1966-05-15  MRN: [id number]  HPI  38 yowf never smoker with onset  Early 2000's of sneezing and itching spring = fall not better on clariton and zyrtec never allergy eval then cough x around 2010 which is about the same she underwent craniotomy posteriorly for meningioma referred 12/26/2013 by Dr Basilio Cairo to pulmonary clinic.   12/26/2013 1st [facility] office visit/ Bracen Schum  Chief Complaint  Patient presents with  . Pulmonary Consult    Referred per Dr. Sherrye Payor. Pt c/o cough x 4-5 years, seems to be gradually getting worse. Cough is triggered by eating and talking. Cough is non prod, and sometimes coughs until the point of vomiting.   sister has exact same pattern, mother has bad gerd - pt no better previously on PPI but chewing mint gum, cough worse with chocolate  [name]  Do you awaken from a sound sleep coughing violently?                            With trouble breathing? Yes   Do you have choking episodes when you cannot  Get enough air, gasping for air ?              Yes   Do you usually cough when you lie down into  The bed, or when you just lie down to rest ?                          sometimes   Do you usually cough after meals or eating?         Yes   Do you cough when (or after) you bend over?    no   GERD SCORE     Kouffman Reflux v Neurogenic Cough Differentiator Neurogenic   Do you more-or-less cough all day long? Spells    Does change of temperature make you cough? Hot worse   Does laughing or chuckling cause you to cough? sometimes   Do fumes (perfume, automobile fumes, burned  Toast, etc.,) cause you to cough ?      yes   Does speaking, singing, or talking on the phone cause you to cough   ?               yes   Neurogenic/Airway score       No obvious day to day or daytime variabilty or assoc sign sob (unless coughing)  or cp or chest  tightness, subjective wheeze overt sinus or hb symptoms. No unusual exp hx or h/o childhood pna/ asthma or knowledge of premature birth.  Sleeping ok without nocturnal  or early am exacerbation  of respiratory  c/o's or need for noct saba. Also denies any obvious fluctuation of symptoms with weather or environmental changes or other aggravating or alleviating factors except as outlined above   Current Medications, Allergies, Complete Past Medical History, Past Surgical History, Family History, and Social History were reviewed in [employer] record.          Review of Systems  Constitutional: Negative for fever, chills and unexpected weight change.  HENT: Negative for congestion, dental problem, ear pain, nosebleeds, postnasal drip, rhinorrhea, sinus pressure, sneezing, sore throat, trouble swallowing and voice change.   Eyes: Negative for visual disturbance.  Respiratory: Positive for cough. Negative  for choking and shortness of breath.   Cardiovascular: Negative for chest pain and leg swelling.  Gastrointestinal: Negative for vomiting, abdominal pain and diarrhea.  Genitourinary: Negative for difficulty urinating.  Musculoskeletal: Negative for arthralgias.  Skin: Negative for rash.  Neurological: Negative for tremors, syncope and headaches.  Hematological: Does not bruise/bleed easily.       Objective:   Physical Exam  amb wf nad  Wt Readings from Last 3 Encounters:  12/26/13 115 lb (52.164 kg)  11/19/13 113 lb (51.256 kg)  11/19/13 113 lb (51.256 kg)      HEENT: nl dentition, turbinates, and oropharynx with min cobblestoning, no excess pnds   Nl external ear canals without cough reflex   NECK :  without JVD/Nodes/TM/ nl carotid upstrokes bilaterally   LUNGS: no acc muscle use, clear to A and P bilaterally with cough on insp   CV:  RRR  no s3 or murmur or increase in P2, no edema   ABD:  soft and nontender with nl excursion in the supine  position. No bruits or organomegaly, bowel sounds nl  MS:  warm without deformities, calf tenderness, cyanosis or clubbing  SKIN: warm and dry without lesions    NEURO:  alert, approp, no deficits      cxr 08/14/13 The heart size and mediastinal contours are within normal limits.  Both lungs are clear. The visualized skeletal structures are  unremarkable.            Assessment & Plan:

## 2013-12-26 NOTE — Assessment & Plan Note (Signed)
The most common causes of chronic cough in immunocompetent adults include the following: upper airway cough syndrome (UACS), previously referred to as postnasal drip syndrome (PNDS), which is caused by variety of rhinosinus conditions; (2) asthma; (3) GERD; (4) chronic bronchitis from cigarette smoking or other inhaled environmental irritants; (5) nonasthmatic eosinophilic bronchitis; and (6) bronchiectasis.   These conditions, singly or in combination, have accounted for up to 94% of the causes of chronic cough in prospective studies.   Other conditions have constituted no >6% of the causes in prospective studies These have included bronchogenic carcinoma, chronic interstitial pneumonia, sarcoidosis, left ventricular failure, ACEI-induced cough, and aspiration from a condition associated with pharyngeal dysfunction.    Chronic cough is often simultaneously caused by more than one condition. A single cause has been found from 38 to 82% of the time, multiple causes from 18 to 62%. Multiply caused cough has been the result of three diseases up to 42% of the time.       Coughs so hard she vomits so clearly has component of The most common causes of chronic cough in immunocompetent adults include the following: upper airway cough syndrome (UACS), previously referred to as postnasal drip syndrome (PNDS), which is caused by variety of rhinosinus conditions; (2) asthma; (3) GERD; (4) chronic bronchitis from cigarette smoking or other inhaled environmental irritants; (5) nonasthmatic eosinophilic bronchitis; and (6) bronchiectasis.   These conditions, singly or in combination, have accounted for up to 94% of the causes of chronic cough in prospective studies.   Other conditions have constituted no >6% of the causes in prospective studies These have included bronchogenic carcinoma, chronic interstitial pneumonia, sarcoidosis, left ventricular failure, ACEI-induced cough, and aspiration from a condition  associated with pharyngeal dysfunction.    Chronic cough is often simultaneously caused by more than one condition. A single cause has been found from 38 to 82% of the time, multiple causes from 18 to 62%. Multiply caused cough has been the result of three diseases up to 42% of the time.       Based on hx and exam, this is most likely:  Classic Upper airway cough syndrome, so named because it's frequently impossible to sort out how much is  CR/sinusitis with freq throat clearing (which can be related to primary GERD)   vs  causing  secondary (" extra esophageal")  GERD from wide swings in gastric pressure that occur with throat clearing, often  promoting self use of mint and menthol lozenges that reduce the lower esophageal sphincter tone and exacerbate the problem further in a cyclical fashion.   These are the same pts (now being labeled as having "irritable larynx syndrome" by some cough centers) who not infrequently have a history of having failed to tolerate ace inhibitors,  dry powder inhalers or biphosphonates or report having atypical reflux symptoms that don't respond to standard doses of PPI , and are easily confused as having aecopd or asthma flares by even experienced allergists/ pulmonologists.   The first step is to maximize acid suppression and eliminate cyclical coughing then regroup if the cough persists.  See instructions for specific recommendations which were reviewed directly with the patient who was given a copy with highlighter outlining the key components.

## 2014-01-14 ENCOUNTER — Encounter (INDEPENDENT_AMBULATORY_CARE_PROVIDER_SITE_OTHER): Payer: Self-pay | Admitting: General Surgery

## 2014-01-14 ENCOUNTER — Encounter (INDEPENDENT_AMBULATORY_CARE_PROVIDER_SITE_OTHER): Payer: 59 | Admitting: General Surgery

## 2014-01-14 ENCOUNTER — Ambulatory Visit (INDEPENDENT_AMBULATORY_CARE_PROVIDER_SITE_OTHER): Payer: 59 | Admitting: General Surgery

## 2014-01-14 VITALS — BP 105/70 | HR 79 | Temp 98.5°F | Resp 16 | Ht 63.0 in | Wt 112.4 lb

## 2014-01-14 DIAGNOSIS — D249 Benign neoplasm of unspecified breast: Secondary | ICD-10-CM

## 2014-01-14 DIAGNOSIS — D241 Benign neoplasm of right breast: Secondary | ICD-10-CM

## 2014-01-14 NOTE — Patient Instructions (Signed)
Your right breast lumpectomy pathology report shows a benign fibroadenoma. You have been given a copy of this pathology report.  This is a noncancerous tumor, and does not carry an increased risk for cancer in the future.  Be sure to get annual mammograms from here on out, and be sure to get an annual breast exam with your regular physicians.  Return to see Dr. Dalbert Batman if necessary.

## 2014-01-14 NOTE — Progress Notes (Signed)
Patient ID: Deborah Ortega, female   DOB: Mar 11, 1966, 48 y.o.   MRN: [id number] History:  This woman underwent right lumpectomy with radioactive seed localization on 11/19/2013. She has recovered from surgery without any problems. Her final pathology report shows a benign fibroadenoma. We have discussed this and I gave her a copy of the report.  Exam: Patient looks well. No distress. Incision in the right breast laterally is healing normally. No seroma. No hematoma. No infection. Excellent contour and cosmesis. Normal range of motion right shoulder  Assessment: Fibroadenoma right breast, 9 o'clock position, recovering uneventfully following a lumpectomy  Plan: Annual breast exam with her regular physician Annual mammography Return to see me as needed.   Edsel Petrin. Dalbert Batman, M.D., [facility] General and Minimally invasive Surgery Breast and Colorectal Surgery Office:   [phone] Pager:   [phone]

## 2014-01-23 ENCOUNTER — Encounter: Payer: Self-pay | Admitting: Internal Medicine

## 2014-01-23 ENCOUNTER — Ambulatory Visit (INDEPENDENT_AMBULATORY_CARE_PROVIDER_SITE_OTHER): Payer: 59 | Admitting: Internal Medicine

## 2014-01-23 VITALS — BP 104/70 | HR 88 | Temp 98.8°F | Ht 68.0 in | Wt 115.4 lb

## 2014-01-23 DIAGNOSIS — R059 Cough, unspecified: Secondary | ICD-10-CM

## 2014-01-23 DIAGNOSIS — R05 Cough: Secondary | ICD-10-CM

## 2014-01-23 MED ORDER — GABAPENTIN 100 MG PO CAPS: 100.0000 mg | ORAL_CAPSULE | Freq: Three times a day (TID) | ORAL | Status: DC

## 2014-01-23 MED ORDER — PREDNISONE 10 MG PO TABS: ORAL_TABLET | ORAL | Status: DC

## 2014-01-23 NOTE — Patient Instructions (Addendum)
The key to effective treatment for your cough is eliminating the non-stop cycle of cough you're stuck in long enough to let your airway heal completely and then see if there is anything still making you cough once you stop the cough suppression, but this should take no more than 5 days to figure out  First take delsym two tsp every 12 hours and supplement if needed with  tramadol 50 mg up to 2 every 4 hours to suppress the urge to cough at all or even clear your throat. Swallowing water or using ice chips/non mint and menthol containing candies (such as lifesavers or sugarless jolly ranchers) are also effective.  You should rest your voice and avoid activities that you know make you cough.  Once you have eliminated the cough for 3 straight days try reducing the tramadol first,  then the delsym as tolerated.    Gabapentin 100 mg three times a day until return   Prednisone 10 mg take  4 each am x 2 days,   2 each am x 2 days,  1 each am x 2 days and stop (this is to eliminate allergies and inflammation from coughing)  Protonix (pantoprazole) Take 30-60 min before first meal of the day and Pepcid 20 mg one bedtime plus chlorpheniramine 4 mg x 1 at bedtime (both available over the counter)  until cough is completely gone for at least a week without the need for cough suppression  GERD (REFLUX)  is an extremely common cause of respiratory symptoms, many times with no significant heartburn at all.    It can be treated with medication, but also with lifestyle changes including avoidance of late meals, excessive alcohol, smoking cessation, and avoid fatty foods, chocolate, peppermint, colas, red wine, and acidic juices such as orange juice.  NO MINT OR MENTHOL PRODUCTS SO NO COUGH DROPS  USE HARD CANDY INSTEAD (jolley ranchers or Stover's or Lifesavers (all available in sugarless versions) NO OIL BASED VITAMINS - use powdered substitutes.  Please schedule a follow up office visit in 4 weeks, sooner if  needed

## 2014-01-23 NOTE — Progress Notes (Signed)
Subjective:    Patient ID: Deborah Ortega, female    DOB: 07-24-1966  MRN: [id number]    Brief patient profile:  58 yowf never smoker with onset  Early 2000's of sneezing and itching spring = fall not better on clariton and zyrtec never allergy eval then cough x around 2010 which is about the same time  she underwent craniotomy posteriorly for meningioma referred 12/26/2013 by Dr Basilio Cairo to pulmonary clinic.  History of Present Illness  12/26/2013 1st [facility] office visit/ Rakeen Gaillard  Chief Complaint  Patient presents with  . Pulmonary Consult    Referred per Dr. Sherrye Payor. Pt c/o cough x 4-5 years, seems to be gradually getting worse. Cough is triggered by eating and talking. Cough is non prod, and sometimes coughs until the point of vomiting.   sister has exact same pattern, mother has bad gerd - pt no better previously on PPI but chewing mint gum, cough worse with chocolate rec Pantoprazole (protonix) 40 mg   Take 30-60 min before first meal of the day and Pepcid 20 mg one bedtime and chloretrimeton 4 mg take 2 at bedtime  until return to office - this is the best way to tell whether stomach acid is contributing to your problem.   Prednisone 10 mg take  4 each am x 2 days,   2 each am x 2 days,  1 each am x 2 days and stop Take delsym two tsp every 12 hours and supplement if needed with  tramadol 50 mg up to 2 every 4 hours to suppress the urge to cough. Swallowing water or using ice chips/non mint and menthol containing candies (such as lifesavers or sugarless jolly ranchers) are also effective.  You should rest your voice and avoid activities that you know make you cough. Once you have eliminated the cough for 3 straight days try reducing the tramadol first   01/23/2014 f/u ov/Bradan Congrove re: cough x 4 y  Chief Complaint  Patient presents with  . Follow-up    Pt states cough has improved slightly since last visit.  She feels it was better while on prednisone. No new co's today.    never took tramadol at all / noct cough is gone / never able to eliminate daytime urge to clear throat and cough   [name]  Do you awaken from a sound sleep coughing violently?                            With trouble breathing? Resolved    Do you have choking episodes when you cannot  Get enough air, gasping for air ?              Yes, still daytime   Do you usually cough when you lie down into  The bed, or when you just lie down to rest ?                          better   Do you usually cough after meals or eating?         Yes, still    Do you cough when (or after) you bend over?    no   GERD SCORE     Kouffman Reflux v Neurogenic Cough Differentiator Neurogenic   Do you more-or-less cough all day long? Spells    Does change of temperature make  you cough? Hot worse   Does laughing or chuckling cause you to cough? sometimes   Do fumes (perfume, automobile fumes, burned  Toast, etc.,) cause you to cough ?      yes   Does speaking, singing, or talking on the phone cause you to cough   ?               yes   Neurogenic/Airway score        No obvious  other patterns in day to day or daytime variabilty or assoc sob  or cp or chest tightness, subjective wheeze overt sinus or hb symptoms. No unusual exp hx or h/o childhood pna/ asthma or knowledge of premature birth.  Sleeping ok without nocturnal  or early am exacerbation  of respiratory  c/o's or need for noct saba. Also denies any obvious fluctuation of symptoms with weather or environmental changes or other aggravating or alleviating factors except as outlined above   Current Medications, Allergies, Complete Past Medical History, Past Surgical History, Family History, and Social History were reviewed in [employer] record.  ROS  The following are not active complaints unless bolded sore throat, dysphagia, dental problems, itching, sneezing,  nasal congestion  or excess/ purulent secretions, ear ache,   fever, chills, sweats, unintended wt loss, pleuritic or exertional cp, hemoptysis,  orthopnea pnd or leg swelling, presyncope, palpitations, heartburn, abdominal pain, anorexia, nausea, vomiting, diarrhea  or change in bowel or urinary habits, change in stools or urine, dysuria,hematuria,  rash, arthralgias, visual complaints, headache, numbness weakness or ataxia or problems with walking or coordination,  change in mood/affect or memory.                   Objective:   Physical Exam  amb wf nad  01/23/14             115  Wt Readings from Last 3 Encounters:  12/26/13 115 lb (52.164 kg)  11/19/13 113 lb (51.256 kg)  11/19/13 113 lb (51.256 kg)      HEENT: nl dentition, turbinates, and oropharynx with min cobblestoning, no excess pnds   Nl external ear canals without cough reflex   NECK :  without JVD/Nodes/TM/ nl carotid upstrokes bilaterally   LUNGS: no acc muscle use, clear to A and P bilaterally with cough on insp   CV:  RRR  no s3 or murmur or increase in P2, no edema   ABD:  soft and nontender with nl excursion in the supine position. No bruits or organomegaly, bowel sounds nl  MS:  warm without deformities, calf tenderness, cyanosis or clubbing      cxr 08/14/13 The heart size and mediastinal contours are within normal limits.  Both lungs are clear. The visualized skeletal structures are  unremarkable.            Assessment & Plan:

## 2014-01-24 NOTE — Assessment & Plan Note (Signed)
-    Cyclical cough regimen  12/26/2013  > not completed as instructed  The standardized cough guidelines published in Chest by Lissa Morales in 2006 are still the best available and consist of a multiple step process (up to 12!) , not a single office visit,  and are intended  to address this problem logically,  with an alogrithm dependent on response to empiric treatment at  each progressive step  to determine a specific diagnosis with  minimal addtional testing needed. Therefore if adherence is an issue or can't be accurately verified,  it's very unlikely the standard evaluation and treatment will be successful here.    Furthermore, response to therapy (other than acute cough suppression, which should only be used short term with avoidance of narcotic containing cough syrups if possible), can be a gradual process for which the patient may perceive immediate benefit.  Unlike going to an eye doctor where the best perscription is almost always the first one and is immediately effective, this is almost never the case in the management of chronic cough syndromes. Therefore the patient needs to commit up front to consistently adhere to recommendations  for up to 6 weeks of therapy directed at the likely underlying problem(s) before the response can be reasonably evaluated.    Will try adding neurontin 100 tid since not able to take tramadol as instructed prn daytime otherwise repeat same protocol

## 2014-02-05 ENCOUNTER — Telehealth: Payer: Self-pay | Admitting: Internal Medicine

## 2014-02-05 MED ORDER — BENZONATATE 100 MG PO CAPS: 100.0000 mg | ORAL_CAPSULE | Freq: Three times a day (TID) | ORAL | Status: DC | PRN

## 2014-02-05 NOTE — Telephone Encounter (Signed)
Pt aware of recs. RX sent to the pharm. Nothing further needed 

## 2014-02-05 NOTE — Telephone Encounter (Signed)
Spoke with pt .  She states that cough is doing a lot better but has not been one whole day without coughing.  Pt is following all MW instructions but Tramadol is causing her to be extremely sleepy.  Pt has even tried taking 1/4 tablet and this still caused extreme sleepiness.  MW instructions were as follows:  First take delsym two tsp every 12 hours and supplement if needed with  tramadol 50 mg up to 2 every 4 hours to suppress the urge to cough at all or even clear your throat. Swallowing water or using ice chips/non mint and menthol containing candies (such as lifesavers or sugarless jolly ranchers) are also effective.  You should rest your voice and avoid activities that you know make you cough.  Once you have eliminated the cough for 3 straight days try reducing the tramadol first,  then the delsym as tolerated.    Gabapentin 100 mg three times a day until return   Prednisone 10 mg take  4 each am x 2 days,   2 each am x 2 days,  1 each am x 2 days and stop (this is to eliminate allergies and inflammation from coughing)  Protonix (pantoprazole) Take 30-60 min before first meal of the day and Pepcid 20 mg one bedtime plus chlorpheniramine 4 mg x 1 at bedtime (both available over the counter)  until cough is completely gone for at least a week without the need for cough suppression    Please advise SN

## 2014-02-05 NOTE — Telephone Encounter (Signed)
Per SN---  Intolerant to tramadol. Call in [place]  #[phone]  1-2 po TID prn cough Continue maximum GERD/reflux regimen

## 2014-03-05 ENCOUNTER — Ambulatory Visit (INDEPENDENT_AMBULATORY_CARE_PROVIDER_SITE_OTHER): Payer: 59 | Admitting: Internal Medicine

## 2014-03-05 ENCOUNTER — Ambulatory Visit: Payer: 59 | Admitting: Internal Medicine

## 2014-03-05 ENCOUNTER — Encounter: Payer: Self-pay | Admitting: Internal Medicine

## 2014-03-05 VITALS — BP 104/60 | HR 78 | Temp 98.7°F | Ht 63.0 in | Wt 117.4 lb

## 2014-03-05 DIAGNOSIS — R05 Cough: Secondary | ICD-10-CM

## 2014-03-05 DIAGNOSIS — R059 Cough, unspecified: Secondary | ICD-10-CM

## 2014-03-05 MED ORDER — BENZONATATE 200 MG PO CAPS: ORAL_CAPSULE | ORAL | Status: DC

## 2014-03-05 MED ORDER — PREDNISONE 10 MG PO TABS: ORAL_TABLET | ORAL | Status: DC

## 2014-03-05 MED ORDER — GABAPENTIN 100 MG PO CAPS
ORAL_CAPSULE | ORAL | Status: DC
Start: 2014-03-05 — End: 2014-04-02

## 2014-03-05 NOTE — Progress Notes (Signed)
Subjective:    Patient ID: Deborah Ortega, female    DOB: 1966/06/04  MRN: [id number]    Brief patient profile:  78 yowf never smoker with onset  Early 2000's of sneezing and itching spring = fall not better on clariton and zyrtec never allergy eval then cough x around 2010 which is about the same time  she underwent craniotomy posteriorly for meningioma referred 12/26/2013 by Dr Basilio Cairo to pulmonary clinic.  History of Present Illness  12/26/2013 1st [facility] office visit/ Deborah Ortega  Chief Complaint  Patient presents with  . Pulmonary Consult    Referred per Dr. Sherrye Payor. Pt c/o cough x 4-5 years, seems to be gradually getting worse. Cough is triggered by eating and talking. Cough is non prod, and sometimes coughs until the point of vomiting.   sister has exact same pattern, mother has bad gerd - pt no better previously on PPI but chewing mint gum, cough worse with chocolate rec Pantoprazole (protonix) 40 mg   Take 30-60 min before first meal of the day and Pepcid 20 mg one bedtime and chloretrimeton 4 mg take 2 at bedtime  until return to office - this is the best way to tell whether stomach acid is contributing to your problem.   Prednisone 10 mg take  4 each am x 2 days,   2 each am x 2 days,  1 each am x 2 days and stop Take delsym two tsp every 12 hours and supplement if needed with  tramadol 50 mg up to 2 every 4 hours to suppress the urge to cough. Swallowing water or using ice chips/non mint and menthol containing candies (such as lifesavers or sugarless jolly ranchers) are also effective.  You should rest your voice and avoid activities that you know make you cough. Once you have eliminated the cough for 3 straight days try reducing the tramadol first   01/23/2014 f/u ov/Deborah Ortega re: cough x 4 y  Chief Complaint  Patient presents with  . Follow-up    Pt states cough has improved slightly since last visit.  She feels it was better while on prednisone. No new co's today.    never took tramadol at all / noct cough is gone / never able to eliminate daytime urge to clear throat and cough   [name]  Do you awaken from a sound sleep coughing violently?                            With trouble breathing? Resolved    Do you have choking episodes when you cannot  Get enough air, gasping for air ?              Yes, still daytime   Do you usually cough when you lie down into  The bed, or when you just lie down to rest ?                          better   Do you usually cough after meals or eating?         Yes, still    Do you cough when (or after) you bend over?    no   GERD SCORE     Kouffman Reflux v Neurogenic Cough Differentiator Neurogenic   Do you more-or-less cough all day long? Spells    Does change of temperature make  you cough? Hot worse   Does laughing or chuckling cause you to cough? sometimes   Do fumes (perfume, automobile fumes, burned  Toast, etc.,) cause you to cough ?      yes   Does speaking, singing, or talking on the phone cause you to cough   ?               yes   Neurogenic/Airway score      rec First take delsym two tsp every 12 hours and supplement if needed with  tramadol 50 mg up to 2 every 4 hours  .   Gabapentin 100 mg three times a day until return  Prednisone 10 mg take  4 each am x 2 days,   2 each am x 2 days,  1 each am x 2 days and stop (this is to eliminate allergies and inflammation from coughing) Protonix (pantoprazole) Take 30-60 min before first meal of the day and Pepcid 20 mg one bedtime plus chlorpheniramine 4 mg x 1 at bedtime (both available over the counter)  until cough is completely gone for at least a week without the need for cough suppression GERD diet    03/05/2014 f/u ov/Deborah Ortega re: cough x 5 years Chief Complaint  Patient presents with  . Follow-up    Pt states that her cough has improved, but not resolved. No new co's today.   not able to take tramadol  and neurontin together but tessilon twice daily still having cough to point vomiting Cough more day than night   No obvious  other patterns in day to day or daytime variabilty or assoc sob  or cp or chest tightness, subjective wheeze overt sinus or hb symptoms. No unusual exp hx or h/o childhood pna/ asthma or knowledge of premature birth.  Sleeping ok without nocturnal  or early am exacerbation  of respiratory  c/o's or need for noct saba. Also denies any obvious fluctuation of symptoms with weather or environmental changes or other aggravating or alleviating factors except as outlined above   Current Medications, Allergies, Complete Past Medical History, Past Surgical History, Family History, and Social History were reviewed in [employer] record.  ROS  The following are not active complaints unless bolded sore throat, dysphagia, dental problems, itching, sneezing,  nasal congestion or excess/ purulent secretions, ear ache,   fever, chills, sweats, unintended wt loss, pleuritic or exertional cp, hemoptysis,  orthopnea pnd or leg swelling, presyncope, palpitations, heartburn, abdominal pain, anorexia, nausea, vomiting, diarrhea  or change in bowel or urinary habits, change in stools or urine, dysuria,hematuria,  rash, arthralgias, visual complaints, headache, numbness weakness or ataxia or problems with walking or coordination,  change in mood/affect or memory.                   Objective:   Physical Exam  amb wf nad  01/23/14             115 >  03/05/2014   117  Wt Readings from Last 3 Encounters:  12/26/13 115 lb (52.164 kg)  11/19/13 113 lb (51.256 kg)  11/19/13 113 lb (51.256 kg)      HEENT: nl dentition, turbinates, and oropharynx with min cobblestoning, no excess pnds   Nl external ear canals without cough reflex   NECK :  without JVD/Nodes/TM/ nl carotid upstrokes bilaterally   LUNGS: no acc muscle use, clear to A and P bilaterally with cough on  insp   CV:  RRR  no s3 or murmur or increase in P2, no edema   ABD:  soft and nontender with nl excursion in the supine position. No bruits or organomegaly, bowel sounds nl  MS:  warm without deformities, calf tenderness, cyanosis or clubbing      cxr 08/14/13 The heart size and mediastinal contours are within normal limits.  Both lungs are clear. The visualized skeletal structures are  unremarkable.            Assessment & Plan:

## 2014-03-05 NOTE — Patient Instructions (Addendum)
tessilon 200 four times day until no coughing at all x one week   Increase neurontin to four times (bfast lunch supper and bedtime )  Change the night time routine to take pepcid 20 mg and 2x chloretrimeton one hour before bedtime   Please schedule a follow up office visit in 4 weeks, sooner if needed

## 2014-03-06 NOTE — Assessment & Plan Note (Signed)
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   These are the same pts (now being labeled as having "irritable larynx syndrome" by some cough centers) who not infrequently have a history of having failed to tolerate ace inhibitors,  dry powder inhalers or biphosphonates or report having atypical reflux symptoms that don't respond to standard doses of PPI , and are easily confused as having aecopd or asthma flares by even experienced allergists/ pulmonologists.   Try max neurontin, add 1st gen h1 per guidelines    Each maintenance medication was reviewed in detail including most importantly the difference between maintenance and as needed and under what circumstances the prns are to be used.  Please see instructions for details which were reviewed in writing and the patient given a copy.

## 2014-03-22 ENCOUNTER — Telehealth: Payer: Self-pay | Admitting: Internal Medicine

## 2014-03-22 ENCOUNTER — Other Ambulatory Visit: Payer: Self-pay | Admitting: *Deleted

## 2014-03-22 MED ORDER — PANTOPRAZOLE SODIUM 40 MG PO TBEC: 40.0000 mg | DELAYED_RELEASE_TABLET | Freq: Every day | ORAL | Status: DC

## 2014-03-22 NOTE — Telephone Encounter (Signed)
Pt aware of recs, She scheduled appt for Monday at 3:45. She did not want the phenergan called in. Nothing further needed

## 2014-03-22 NOTE — Telephone Encounter (Signed)
Pt called in regards to tramadol rx. [phone]

## 2014-03-22 NOTE — Telephone Encounter (Signed)
The last instructions don't include tramadol at all but if she has followed those instructions to the letter and still can't control the cough except with tramadol we need to regroup with all meds in hand - in meantime can use phenergan tablets 12.5 mg every 6 h prn nausea

## 2014-03-22 NOTE — Telephone Encounter (Signed)
Called spoke with pt. She reports she is attempting to do the cyclical cough protocol. She started this yesterday reason she started the tramadol. Per pt she did not cough at all yesterday.  She reports she was told by MW she needed 3 days to do the protocol. She took the tramadol yesterday and had no problems with it. She only had vomiting once today and reports has also had no cough.  She does not want OV. Please advise thanks

## 2014-03-22 NOTE — Telephone Encounter (Signed)
Per pt's sister- pt is taking tramadol now to suppress her cough This is helping her cough, but causing her to have nausea and vomiting Please advise thanks!

## 2014-03-22 NOTE — Telephone Encounter (Signed)
No problem then just use the phenergan but really don't want her vomiting at all while on the cough protocol and if continues will obviously need to stop the tramadol and list an allergy

## 2014-03-25 ENCOUNTER — Encounter: Payer: Self-pay | Admitting: Internal Medicine

## 2014-03-25 ENCOUNTER — Ambulatory Visit (INDEPENDENT_AMBULATORY_CARE_PROVIDER_SITE_OTHER): Payer: 59 | Admitting: Internal Medicine

## 2014-03-25 VITALS — BP 110/62 | HR 88 | Temp 98.1°F | Ht 63.0 in | Wt 119.6 lb

## 2014-03-25 DIAGNOSIS — R05 Cough: Secondary | ICD-10-CM

## 2014-03-25 DIAGNOSIS — R058 Other specified cough: Secondary | ICD-10-CM

## 2014-03-25 DIAGNOSIS — R059 Cough, unspecified: Secondary | ICD-10-CM

## 2014-03-25 MED ORDER — PROMETHAZINE HCL 12.5 MG PO TABS: 12.5000 mg | ORAL_TABLET | Freq: Four times a day (QID) | ORAL | Status: DC | PRN

## 2014-03-25 MED ORDER — PREDNISONE 10 MG PO TABS: ORAL_TABLET | ORAL | Status: DC

## 2014-03-25 NOTE — Assessment & Plan Note (Addendum)
-    Cyclical cough regimen  12/26/2013  > not completed as instructed -  01/23/14  added neurontin 100 mg tid > increased to qid 03/06/2014 > head slt foggy and doesn't think she can increase dose   I had an extended discussion with the patient today lasting 15 to 20 minutes of a 25 minute visit on the following issues:  - rec repeat cyclical cough regimen with tramadol/phenergan  See instructions for specific recommendations which were reviewed directly with the patient who was given a copy with highlighter outlining the key components.

## 2014-03-25 NOTE — Patient Instructions (Addendum)
If the cough is not completely gone reasonable to try phenergan with tramadol every 4h but take  Prednisone 10 mg take  4 each am x 2 days,   2 each am x 2 days,  1 each am x 2 days and stop   Please schedule a follow up office visit in 6 weeks, call sooner if needed

## 2014-03-25 NOTE — Progress Notes (Signed)
Subjective:   Patient ID: Deborah Ortega, female    DOB: Jun 17, 1966  MRN: [id number]    Brief patient profile:  48 yowf never smoker with onset  Early 2000's of sneezing and itching spring = fall not better on clariton and zyrtec never allergy eval then cough x around 2010 which is about the same time  she underwent craniotomy posteriorly for meningioma referred 12/26/2013 by Dr Basilio Cairo to pulmonary clinic.   History of Present Illness  12/26/2013 1st [facility] office visit/ Deborah Ortega  Chief Complaint  Patient presents with  . Pulmonary Consult    Referred per Dr. Sherrye Payor. Pt c/o cough x 4-5 years, seems to be gradually getting worse. Cough is triggered by eating and talking. Cough is non prod, and sometimes coughs until the point of vomiting.   sister has exact same pattern, mother has bad gerd - pt no better previously on PPI but chewing mint gum, cough worse with chocolate rec Pantoprazole (protonix) 40 mg   Take 30-60 min before first meal of the day and Pepcid 20 mg one bedtime and chloretrimeton 4 mg take 2 at bedtime  until return to office - this is the best way to tell whether stomach acid is contributing to your problem.   Prednisone 10 mg take  4 each am x 2 days,   2 each am x 2 days,  1 each am x 2 days and stop Take delsym two tsp every 12 hours and supplement if needed with  tramadol 50 mg up to 2 every 4 hours to suppress the urge to cough. Swallowing water or using ice chips/non mint and menthol containing candies (such as lifesavers or sugarless jolly ranchers) are also effective.  You should rest your voice and avoid activities that you know make you cough. Once you have eliminated the cough for 3 straight days try reducing the tramadol first   01/23/2014 f/u ov/Deborah Ortega re: cough x 4 y  Chief Complaint  Patient presents with  . Follow-up    Pt states cough has improved slightly since last visit.  She feels it was better while on prednisone. No new co's today.    never took tramadol at all / noct cough is gone / never able to eliminate daytime urge to clear throat and cough   [name]  Do you awaken from a sound sleep coughing violently?                            With trouble breathing? Resolved    Do you have choking episodes when you cannot  Get enough air, gasping for air ?              Yes, still daytime   Do you usually cough when you lie down into  The bed, or when you just lie down to rest ?                          better   Do you usually cough after meals or eating?         Yes, still    Do you cough when (or after) you bend over?    no   GERD SCORE     Kouffman Reflux v Neurogenic Cough Differentiator Neurogenic   Do you more-or-less cough all day long? Spells    Does change of temperature make  you cough? Hot worse   Does laughing or chuckling cause you to cough? sometimes   Do fumes (perfume, automobile fumes, burned  Toast, etc.,) cause you to cough ?      yes   Does speaking, singing, or talking on the phone cause you to cough   ?               yes   Neurogenic/Airway score      rec First take delsym two tsp every 12 hours and supplement if needed with  tramadol 50 mg up to 2 every 4 hours  .   Gabapentin 100 mg three times a day until return  Prednisone 10 mg take  4 each am x 2 days,   2 each am x 2 days,  1 each am x 2 days and stop (this is to eliminate allergies and inflammation from coughing) Protonix (pantoprazole) Take 30-60 min before first meal of the day and Pepcid 20 mg one bedtime plus chlorpheniramine 4 mg x 1 at bedtime (both available over the counter)  until cough is completely gone for at least a week without the need for cough suppression GERD diet    03/05/2014 f/u ov/Deborah Ortega re: cough x 5 years Chief Complaint  Patient presents with  . Follow-up    Pt states that her cough has improved, but not resolved. No new co's today.   not able to take tramadol  and neurontin together but tessilon twice daily still having cough to point vomiting Cough more day than night  rec tessilon 200 four times day until no coughing at all x one week  Increase neurontin to four times (bfast lunch supper and bedtime ) Change the night time routine to take pepcid 20 mg and 2 x chloretrimeton one hour before bedtime  Please schedule a follow up office visit in 4 weeks, sooner if needed    03/25/2014 f/u ov/Deborah Ortega re: cough x 5 years  Chief Complaint  Patient presents with  . Acute Visit    Pt states that her cough is still not improving. She had time off of work and so tried taking the tramadol- she took 1/2 tablet and went a whole day w/o cough. She states that the next day she tried taking med again and this caused vomiting. Now she is taking 1/4 tablet and no more vomiting, but still has the cough.   not coughing at all HS using one chloretrimeton and pepcd at bedtime but can't use chortrimeton daytime ? 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    No obvious  other patterns in day to day or daytime variabilty or assoc sob  or cp or chest tightness, subjective wheeze overt sinus or hb symptoms. No unusual exp hx or h/o childhood pna/ asthma or knowledge of premature birth.  Sleeping ok without nocturnal  or early am exacerbation  of respiratory  c/o's or need for noct saba. Also denies any obvious fluctuation of symptoms with weather or environmental changes or other aggravating or alleviating factors except as outlined above   Current Medications, Allergies, Complete Past Medical History, Past Surgical History, Family History, and Social History were reviewed in [employer] record.  ROS  The following are not active complaints unless bolded sore throat, dysphagia, dental problems, itching, sneezing,  nasal congestion or excess/ purulent secretions, ear ache,   fever, chills, sweats, unintended wt  loss, pleuritic or exertional cp,  hemoptysis,  orthopnea pnd or leg swelling, presyncope, palpitations, heartburn, abdominal pain, anorexia, nausea, vomiting, diarrhea  or change in bowel or urinary habits, change in stools or urine, dysuria,hematuria,  rash, arthralgias, visual complaints, headache, numbness weakness or ataxia or problems with walking or coordination,  change in mood/affect or memory.                   Objective:   Physical Exam  amb wf nad  01/23/14             115 >  03/05/2014   117 > 03/25/2014  120  Wt Readings from Last 3 Encounters:  12/26/13 115 lb (52.164 kg)  11/19/13 113 lb (51.256 kg)  11/19/13 113 lb (51.256 kg)      HEENT: nl dentition, turbinates, and oropharynx with min cobblestoning, no excess pnds   Nl external ear canals without cough reflex   NECK :  without JVD/Nodes/TM/ nl carotid upstrokes bilaterally   LUNGS: no acc muscle use, clear to A and P bilaterally with cough on insp   CV:  RRR  no s3 or murmur or increase in P2, no edema   ABD:  soft and nontender with nl excursion in the supine position. No bruits or organomegaly, bowel sounds nl  MS:  warm without deformities, calf tenderness, cyanosis or clubbing      cxr 08/14/13 The heart size and mediastinal contours are within normal limits.  Both lungs are clear. The visualized skeletal structures are  unremarkable.            Assessment & Plan:

## 2014-04-02 ENCOUNTER — Ambulatory Visit: Payer: 59 | Admitting: Internal Medicine

## 2014-04-02 ENCOUNTER — Other Ambulatory Visit: Payer: Self-pay

## 2014-04-02 MED ORDER — GABAPENTIN 100 MG PO CAPS: ORAL_CAPSULE | ORAL | Status: DC

## 2014-04-02 NOTE — Telephone Encounter (Signed)
Rx request sent for 90 day supply to satisfy pts insurance requirement.

## 2014-04-08 ENCOUNTER — Other Ambulatory Visit: Payer: Self-pay | Admitting: Internal Medicine

## 2014-04-19 ENCOUNTER — Encounter: Payer: Self-pay | Admitting: Internal Medicine

## 2014-04-19 ENCOUNTER — Ambulatory Visit (INDEPENDENT_AMBULATORY_CARE_PROVIDER_SITE_OTHER): Payer: 59 | Admitting: Internal Medicine

## 2014-04-19 VITALS — BP 102/68 | HR 87 | Temp 98.5°F

## 2014-04-19 DIAGNOSIS — J069 Acute upper respiratory infection, unspecified: Secondary | ICD-10-CM | POA: Insufficient documentation

## 2014-04-19 MED ORDER — AZITHROMYCIN 250 MG PO TABS: ORAL_TABLET | ORAL | Status: DC

## 2014-04-19 MED ORDER — ONDANSETRON HCL 4 MG PO TABS: 4.0000 mg | ORAL_TABLET | Freq: Three times a day (TID) | ORAL | Status: DC | PRN

## 2014-04-19 MED ORDER — HYDROCODONE-HOMATROPINE 5-1.5 MG/5ML PO SYRP: 5.0000 mL | ORAL_SOLUTION | Freq: Four times a day (QID) | ORAL | Status: DC | PRN

## 2014-04-19 NOTE — Progress Notes (Signed)
Pre visit review using our clinic review tool, if applicable. No additional management support is needed unless otherwise documented below in the visit note. 

## 2014-04-19 NOTE — Patient Instructions (Signed)
Please take all new medication as prescribed - the antibiotic, cough medicine, and nausea medication if needed  Please continue all other medications as before, and refills have been done if requested.  Please have the pharmacy call with any other refills you may need.  Please keep your appointments with your specialists as you may have planned

## 2014-04-19 NOTE — Progress Notes (Signed)
Subjective:    Patient ID: Deborah Ortega, female    DOB: [birth year], 48 y.o.   MRN: [id number]  HPI   Here with 2-3 days acute onset fever 102, facial pain, pressure, headache, general weakness and malaise,  with severe ST and non prod cough, but pt denies chest pain, wheezing, increased sob or doe, orthopnea, PND, increased LE swelling, palpitations, dizziness or syncope.  + exposure to student with strep proven ST.  Did have vomit x 1 only, but persistent nausea as well.  Denies worsening reflux, abd pain, dysphagia, bowel change or blood. Past Medical History  Diagnosis Date  . ALLERGIC RHINITIS   . GERD   . 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 MIGRAINE HEADACHE   . RENAL CALCULUS, HX OF 06/25/2007, 02/2013  . THYROID NODULE   . HOH (hard of hearing)     both ears  . PONV (postoperative nausea and vomiting)    Past Surgical History  Procedure Laterality Date  . Cesarean section      x's 2  . Meninogioma resection  07/25/09    pariatal area head-crainiotomy-plate  . Wisdom tooth extraction    . Breast lumpectomy with radioactive seed localization Right 11/19/2013    Procedure: BREAST LUMPECTOMY WITH RADIOACTIVE SEED LOCALIZATION;  Surgeon: Adin Hector, MD;  Location: [place];  Service: General;  Laterality: Right;    reports that she has never smoked. She has never used smokeless tobacco. She reports that she does not drink alcohol or use illicit drugs. family history includes Asthma in her cousin; Diabetes in her other; Hyperlipidemia in her other; Stroke in her other; Ulcerative colitis in her other. Allergies  Allergen Reactions  . Ibuprofen Rash  . Levofloxacin     Cannot sleep  . Morphine And Related Itching  . Tramadol     Extreme sleepiness/vomiting- can tolerate small dose per pt  . Adhesive [Tape] Rash  . Aleve [Naproxen Sodium] Rash  . Latex Rash   Current Outpatient Prescriptions on File Prior to Visit  Medication Sig Dispense Refill    . escitalopram (LEXAPRO) 10 MG tablet Take 20 mg by mouth daily.       . famotidine (PEPCID) 20 MG tablet One at bedtime  30 tablet  11  . gabapentin (NEURONTIN) 100 MG capsule One four times daily  480 capsule  1  . loratadine (CLARITIN) 10 MG tablet Take 10 mg by mouth daily.      . Multiple Vitamin (MULTIVITAMIN) capsule Take 1 capsule by mouth daily.      . pantoprazole (PROTONIX) 40 MG tablet Take 1 tablet (40 mg total) by mouth daily. Take 30-60 min before first meal of the day  90 tablet  2  . promethazine (PHENERGAN) 12.5 MG tablet Take 1 tablet (12.5 mg total) by mouth every 6 (six) hours as needed for nausea or vomiting.  30 tablet  0  . traMADol (ULTRAM) 50 MG tablet 1/4 tablet every 4 hours as needed for cough       No current facility-administered medications on file prior to visit.   Review of Systems All otherwise neg per pt     Objective:   Physical Exam BP 102/68  Pulse 87  Temp(Src) 98.5 F (36.9 C) (Oral)  SpO2 98% VS noted, mild ill Constitutional: Pt appears well-developed, well-nourished.  HENT: Head: NCAT.  Right Ear: External ear normal.  Left Ear: External ear normal.  Eyes: . Pupils are equal,  round, and reactive to light. Conjunctivae and EOM are normal Neck: Normal range of motion. Neck supple.  Bilat tm's with mild erythema.  Max sinus areas non tender.  Pharynx with mod erythema, + exudate Cardiovascular: Normal rate and regular rhythm.   Pulmonary/Chest: Effort normal and breath sounds normal - no rales or wheezing.  Abd:  Soft, NT, ND, + BS Neurological: Pt is alert. Not confused , motor grossly intact Skin: Skin is warm. No rash Psychiatric: Pt behavior is normal. No agitation.     Assessment & Plan:

## 2014-04-20 NOTE — Assessment & Plan Note (Signed)
Mild to mod, for antibx course,  to f/u any worsening symptoms or concerns 

## 2014-05-07 ENCOUNTER — Ambulatory Visit: Payer: 59 | Admitting: Internal Medicine

## 2014-05-08 ENCOUNTER — Encounter: Payer: Self-pay | Admitting: Internal Medicine

## 2014-05-08 ENCOUNTER — Ambulatory Visit (INDEPENDENT_AMBULATORY_CARE_PROVIDER_SITE_OTHER): Payer: 59 | Admitting: Internal Medicine

## 2014-05-08 VITALS — BP 110/62 | HR 77 | Temp 98.5°F | Ht 63.0 in | Wt 119.0 lb

## 2014-05-08 DIAGNOSIS — R05 Cough: Secondary | ICD-10-CM

## 2014-05-08 DIAGNOSIS — R059 Cough, unspecified: Secondary | ICD-10-CM

## 2014-05-08 DIAGNOSIS — Z23 Encounter for immunization: Secondary | ICD-10-CM

## 2014-05-08 DIAGNOSIS — R058 Other specified cough: Secondary | ICD-10-CM

## 2014-05-08 MED ORDER — PREDNISONE 10 MG PO TABS: ORAL_TABLET | ORAL | Status: DC

## 2014-05-08 MED ORDER — TRAMADOL HCL 50 MG PO TABS
ORAL_TABLET | ORAL | Status: DC
Start: 2014-05-08 — End: 2014-07-16

## 2014-05-08 MED ORDER — BENZONATATE 200 MG PO CAPS: ORAL_CAPSULE | ORAL | Status: DC

## 2014-05-08 NOTE — Patient Instructions (Addendum)
Continue tessilon 200 mg up to every 4 hours / supplement with tramadol /phenergan if needed as needed for nausea   Prednisone 10 mg take  4 each am x 2 days,   2 each am x 2 days,  1 each am x 2 days and stop   Please schedule a follow up office visit in 6 weeks, call sooner if needed

## 2014-05-08 NOTE — Progress Notes (Signed)
Subjective:   Patient ID: Deborah Ortega, female    DOB: Jun 17, 1966  MRN: [id number]    Brief patient profile:  48 yowf never smoker with onset  Early 2000's of sneezing and itching spring = fall not better on clariton and zyrtec never allergy eval then cough x around 2010 which is about the same time  she underwent craniotomy posteriorly for meningioma referred 12/26/2013 by Deborah Ortega to pulmonary clinic.   History of Present Illness  12/26/2013 1st [facility] office visit/ Deborah Ortega  Chief Complaint  Patient presents with  . Pulmonary Consult    Referred per Deborah Ortega. Pt c/o cough x 4-5 years, seems to be gradually getting worse. Cough is triggered by eating and talking. Cough is non prod, and sometimes coughs until the point of vomiting.   sister has exact same pattern, mother has bad gerd - pt no better previously on PPI but chewing mint gum, cough worse with chocolate rec Pantoprazole (protonix) 40 mg   Take 30-60 min before first meal of the day and Pepcid 20 mg one bedtime and chloretrimeton 4 mg take 2 at bedtime  until return to office - this is the best way to tell whether stomach acid is contributing to your problem.   Prednisone 10 mg take  4 each am x 2 days,   2 each am x 2 days,  1 each am x 2 days and stop Take delsym two tsp every 12 hours and supplement if needed with  tramadol 50 mg up to 2 every 4 hours to suppress the urge to cough. Swallowing water or using ice chips/non mint and menthol containing candies (such as lifesavers or sugarless jolly ranchers) are also effective.  You should rest your voice and avoid activities that you know make you cough. Once you have eliminated the cough for 3 straight days try reducing the tramadol first   01/23/2014 f/u ov/Deborah Ortega re: cough x 4 y  Chief Complaint  Patient presents with  . Follow-up    Pt states cough has improved slightly since last visit.  She feels it was better while on prednisone. No new co's today.    never took tramadol at all / noct cough is gone / never able to eliminate daytime urge to clear throat and cough   [name]  Do you awaken from a sound sleep coughing violently?                            With trouble breathing? Resolved    Do you have choking episodes when you cannot  Get enough air, gasping for air ?              Yes, still daytime   Do you usually cough when you lie down into  The bed, or when you just lie down to rest ?                          better   Do you usually cough after meals or eating?         Yes, still    Do you cough when (or after) you bend over?    no   GERD SCORE     Kouffman Reflux v Neurogenic Cough Differentiator Neurogenic   Do you more-or-less cough all day long? Spells    Does change of temperature make  you cough? Hot worse   Does laughing or chuckling cause you to cough? sometimes   Do fumes (perfume, automobile fumes, burned  Toast, etc.,) cause you to cough ?      yes   Does speaking, singing, or talking on the phone cause you to cough   ?               yes   Neurogenic/Airway score      rec First take delsym two tsp every 12 hours and supplement if needed with  tramadol 50 mg up to 2 every 4 hours  .   Gabapentin 100 mg three times a day until return  Prednisone 10 mg take  4 each am x 2 days,   2 each am x 2 days,  1 each am x 2 days and stop (this is to eliminate allergies and inflammation from coughing) Protonix (pantoprazole) Take 30-60 min before first meal of the day and Pepcid 20 mg one bedtime plus chlorpheniramine 4 mg x 1 at bedtime (both available over the counter)  until cough is completely gone for at least a week without the need for cough suppression GERD diet    03/05/2014 f/u ov/Deborah Ortega re: cough x 5 years Chief Complaint  Patient presents with  . Follow-up    Pt states that her cough has improved, but not resolved. No new co's today.   not able to take tramadol  and neurontin together but tessilon twice daily still having cough to point vomiting Cough more day than night  rec tessilon 200 four times day until no coughing at all x one week  Increase neurontin to four times (bfast lunch supper and bedtime ) Change the night time routine to take pepcid 20 mg and 2 x chloretrimeton one hour before bedtime  Please schedule a follow up office visit in 4 weeks, sooner if needed    03/25/2014 f/u ov/Deborah Ortega re: cough x 5 years  Chief Complaint  Patient presents with  . Acute Visit    Pt states that her cough is still not improving. She had time off of work and so tried taking the tramadol- she took 1/2 tablet and went a whole day w/o cough. She states that the next day she tried taking med again and this caused vomiting. Now she is taking 1/4 tablet and no more vomiting, but still has the cough.   not coughing at all HS using one chloretrimeton and pepcd at bedtime but can't use chortrimeton daytime ? 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  rec If the cough is not completely gone reasonable to try phenergan with tramadol every 4h but take  Prednisone 10 mg take  4 each am x 2 days,   2 each am x 2 days,  1 each am x 2 days and stop    05/08/2014 f/u ov/Deborah Ortega re:  Chief Complaint  Patient presents with  . Follow-up    Pt states that her cough is worse since she had developed a cold a wk ago. She has been zithromax and this helped somewhat.      No obvious  other patterns in day to day or daytime variabilty or assoc sob  or cp or chest tightness, subjective wheeze overt sinus or hb symptoms. No unusual exp hx or h/o childhood pna/ asthma or knowledge of premature birth.  Sleeping ok without nocturnal  or early am exacerbation  of respiratory  c/o's or need for noct saba. Also denies any obvious fluctuation of symptoms with weather or environmental changes or other aggravating or alleviating factors except as  outlined above   Current Medications, Allergies, Complete Past Medical History, Past Surgical History, Family History, and Social History were reviewed in [employer] record.  ROS  The following are not active complaints unless bolded sore throat, dysphagia, dental problems, itching, sneezing,  nasal congestion or excess/ purulent secretions, ear ache,   fever, chills, sweats, unintended wt loss, pleuritic or exertional cp, hemoptysis,  orthopnea pnd or leg swelling, presyncope, palpitations, heartburn, abdominal pain, anorexia, nausea, vomiting, diarrhea  or change in bowel or urinary habits, change in stools or urine, dysuria,hematuria,  rash, arthralgias, visual complaints, headache, numbness weakness or ataxia or problems with walking or coordination,  change in mood/affect or memory.                   Objective:   Physical Exam  amb wf nad    01/23/14            115 >  03/05/2014   117 > 03/25/2014  120 > 05/08/2014  Wt Readings from Last 3 Encounters:  12/26/13 115 lb (52.164 kg)  11/19/13 113 lb (51.256 kg)  11/19/13 113 lb (51.256 kg)      HEENT: nl dentition, turbinates, and oropharynx with min cobblestoning, no excess pnds   Nl external ear canals without cough reflex   NECK :  without JVD/Nodes/TM/ nl carotid upstrokes bilaterally   LUNGS: no acc muscle use, clear to A and P bilaterally with cough on insp   CV:  RRR  no s3 or murmur or increase in P2, no edema   ABD:  soft and nontender with nl excursion in the supine position. No bruits or organomegaly, bowel sounds nl  MS:  warm without deformities, calf tenderness, cyanosis or clubbing      cxr 08/14/13 The heart size and mediastinal contours are within normal limits.  Both lungs are clear. The visualized skeletal structures are  unremarkable.            Assessment & Plan:    Subjective:   Patient ID: Deborah Ortega, female    DOB: Jul 03, 1966  MRN: [id number]    Brief  patient profile:  57 yowf never smoker with onset  Early 2000's of sneezing and itching spring = fall not better on clariton and zyrtec never allergy eval then cough x around 2010 which is about the same time  she underwent craniotomy posteriorly for meningioma referred 12/26/2013 by Deborah Ortega to pulmonary clinic.   History of Present Illness  12/26/2013 1st [facility] office visit/ Nida Manfredi  Chief Complaint  Patient presents with  . Pulmonary Consult    Referred per Deborah Ortega. Pt c/o cough x 4-5 years, seems to be gradually getting worse. Cough is triggered by eating and talking. Cough is non prod, and sometimes coughs until the point of vomiting.   sister has exact same pattern, mother has bad gerd - pt no better previously on PPI but chewing mint gum, cough worse with chocolate rec Pantoprazole (protonix) 40 mg   Take 30-60 min before first meal of the day and Pepcid 20 mg one bedtime and chloretrimeton 4 mg take 2 at bedtime  until return to office - this is the best way to tell whether stomach acid is contributing to your problem.   Prednisone 10 mg take  4 each am  x 2 days,   2 each am x 2 days,  1 each am x 2 days and stop Take delsym two tsp every 12 hours and supplement if needed with  tramadol 50 mg up to 2 every 4 hours to suppress the urge to cough. Swallowing water or using ice chips/non mint and menthol containing candies (such as lifesavers or sugarless jolly ranchers) are also effective.  You should rest your voice and avoid activities that you know make you cough. Once you have eliminated the cough for 3 straight days try reducing the tramadol first   01/23/2014 f/u ov/Alyssamae Klinck re: cough x 4 y  Chief Complaint  Patient presents with  . Follow-up    Pt states cough has improved slightly since last visit.  She feels it was better while on prednisone. No new co's today.   never took tramadol at all / noct cough is gone / never able to eliminate daytime urge to clear  throat and cough   [name]  Do you awaken from a sound sleep coughing violently?                            With trouble breathing? Resolved    Do you have choking episodes when you cannot  Get enough air, gasping for air ?              Yes, still daytime   Do you usually cough when you lie down into  The bed, or when you just lie down to rest ?                          better   Do you usually cough after meals or eating?         Yes, still    Do you cough when (or after) you bend over?    no   GERD SCORE     Kouffman Reflux v Neurogenic Cough Differentiator Neurogenic   Do you more-or-less cough all day long? Spells    Does change of temperature make you cough? Hot worse   Does laughing or chuckling cause you to cough? sometimes   Do fumes (perfume, automobile fumes, burned  Toast, etc.,) cause you to cough ?      yes   Does speaking, singing, or talking on the phone cause you to cough   ?               yes   Neurogenic/Airway score      rec First take delsym two tsp every 12 hours and supplement if needed with  tramadol 50 mg up to 2 every 4 hours  .   Gabapentin 100 mg three times a day until return  Prednisone 10 mg take  4 each am x 2 days,   2 each am x 2 days,  1 each am x 2 days and stop (this is to eliminate allergies and inflammation from coughing) Protonix (pantoprazole) Take 30-60 min before first meal of the day and Pepcid 20 mg one bedtime plus chlorpheniramine 4 mg x 1 at bedtime (both available over the counter)  until cough is completely gone for at least a week without the need for cough suppression GERD diet    03/05/2014 f/u ov/Alexxus Sobh re: cough x 5 years Chief Complaint  Patient presents with  . Follow-up    Pt states  that her cough has improved, but not resolved. No new co's today.   not able to take tramadol and neurontin together but tessilon twice daily still having cough to point vomiting Cough more day  than night  rec tessilon 200 four times day until no coughing at all x one week  Increase neurontin to four times (bfast lunch supper and bedtime ) Change the night time routine to take pepcid 20 mg and 2 x chloretrimeton one hour before bedtime  Please schedule a follow up office visit in 4 weeks, sooner if needed    03/25/2014 f/u ov/Merin Borjon re: cough x 5 years  Chief Complaint  Patient presents with  . Acute Visit    Pt states that her cough is still not improving. She had time off of work and so tried taking the tramadol- she took 1/2 tablet and went a whole day w/o cough. She states that the next day she tried taking med again and this caused vomiting. Now she is taking 1/4 tablet and no more vomiting, but still has the cough.   not coughing at all HS using one chloretrimeton and pepcd at bedtime but can't use chortrimeton daytime ? 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  rec Prednisone 10 mg take  4 each am x 2 days,   2 each am x 2 days,  1 each am x 2 days and stop and use tramadol / phenergan prn (didn't need it)    05/08/2014 f/u ov/Naod Sweetland re: cough x 5 years  Chief Complaint  Patient presents with  . Follow-up    Pt states that her cough is worse since she had developed a cold a wk ago. She has been zithromax and this helped somewhat.   was better  But never able to stop tessilon and then acutely ill p exp to sick son x one week prior to OV   Not clear that she's using neurontin consistently but does seem to help and does tolerate the 100 mg strength.  No obvious  other patterns in day to day or daytime variabilty or assoc sob  or cp or chest tightness, subjective wheeze overt sinus or hb symptoms. No unusual exp hx or h/o childhood pna/ asthma or knowledge of premature birth.  Sleeping ok without nocturnal  or early am exacerbation  of respiratory  c/o's or need for noct saba. Also denies any obvious fluctuation of symptoms with weather  or environmental changes or other aggravating or alleviating factors except as outlined above   Current Medications, Allergies, Complete Past Medical History, Past Surgical History, Family History, and Social History were reviewed in [employer] record.  ROS  The following are not active complaints unless bolded sore throat, dysphagia, dental problems, itching, sneezing,  nasal congestion or excess/ purulent secretions, ear ache,   fever, chills, sweats, unintended wt loss, pleuritic or exertional cp, hemoptysis,  orthopnea pnd or leg swelling, presyncope, palpitations, heartburn, abdominal pain, anorexia, nausea, vomiting, diarrhea  or change in bowel or urinary habits, change in stools or urine, dysuria,hematuria,  rash, arthralgias, visual complaints, headache, numbness weakness or ataxia or problems with walking or coordination,  change in mood/affect or memory.                   Objective:   Physical Exam  amb wf nad   01/23/14             115 >  03/05/2014   117 > 03/25/2014  120 >  05/08/2014  120 Wt Readings from Last 3 Encounters:  12/26/13 115 lb (52.164 kg)  11/19/13 113 lb (51.256 kg)  11/19/13 113 lb (51.256 kg)      HEENT: nl dentition, turbinates, and oropharynx with min cobblestoning, no excess pnds   Nl external ear canals without cough reflex   NECK :  without JVD/Nodes/TM/ nl carotid upstrokes bilaterally   LUNGS: no acc muscle use, clear to A and P bilaterally with cough on insp   CV:  RRR  no s3 or murmur or increase in P2, no edema   ABD:  soft and nontender with nl excursion in the supine position. No bruits or organomegaly, bowel sounds nl  MS:  warm without deformities, calf tenderness, cyanosis or clubbing      cxr 08/14/13 The heart size and mediastinal contours are within normal limits.  Both lungs are clear. The visualized skeletal structures are  unremarkable.            Assessment & Plan:

## 2014-05-09 NOTE — Assessment & Plan Note (Signed)
-    Cyclical cough regimen  12/26/2013  > not completed as instructed -  01/23/14  added neurontin 100 mg tid > increased to qid 03/06/2014  - 3/54/5625 repeat cyclical cough regimen with tramadol/phenergan  Was doing some better though still tessalon dep prior to onset of uri from son already on zpak with no evidence of secondary infection but ok to complete it.  In meantime,  Explained natural history of uri and why it's necessary in patients at risk to treat GERD aggressively  at least  short term   to reduce risk of evolving cyclical cough initially  triggered by epithelial injury and a heightened sensitivty to the effects of any upper airway irritants,  most importantly acid - related.  That is, the more sensitive the epithelium damaged for virus, the more the cough, the more the secondary reflux (especially in those prone to reflux) the more the irritation of the sensitive mucosa and so on in a cyclical pattern.   See instructions for specific recommendations which were reviewed directly with the patient who was given a copy with highlighter outlining the key components.

## 2014-06-19 ENCOUNTER — Other Ambulatory Visit (INDEPENDENT_AMBULATORY_CARE_PROVIDER_SITE_OTHER): Payer: 59

## 2014-06-19 ENCOUNTER — Encounter: Payer: Self-pay | Admitting: Internal Medicine

## 2014-06-19 ENCOUNTER — Ambulatory Visit (INDEPENDENT_AMBULATORY_CARE_PROVIDER_SITE_OTHER): Payer: 59 | Admitting: Internal Medicine

## 2014-06-19 ENCOUNTER — Ambulatory Visit (INDEPENDENT_AMBULATORY_CARE_PROVIDER_SITE_OTHER)
Admission: RE | Admit: 2014-06-19 | Discharge: 2014-06-19 | Disposition: A | Discharge status: Home / Self Care | Payer: 59 | Source: Ambulatory Visit | Attending: Internal Medicine | Admitting: Internal Medicine

## 2014-06-19 VITALS — BP 100/60 | HR 74 | Ht 63.0 in | Wt 118.6 lb

## 2014-06-19 DIAGNOSIS — R058 Other specified cough: Secondary | ICD-10-CM

## 2014-06-19 DIAGNOSIS — R05 Cough: Secondary | ICD-10-CM

## 2014-06-19 LAB — CBC WITH DIFFERENTIAL/PLATELET
Basophils Absolute: 0.1 K/uL (ref 0.0–0.1)
Basophils Relative: 0.7 % (ref 0.0–3.0)
Eosinophils Absolute: 0.4 K/uL (ref 0.0–0.7)
Eosinophils Relative: 5.5 % — ABNORMAL HIGH (ref 0.0–5.0)
HCT: 35.4 % — ABNORMAL LOW (ref 36.0–46.0)
Hemoglobin: 11.1 g/dL — ABNORMAL LOW (ref 12.0–15.0)
Lymphocytes Relative: 34.8 % (ref 12.0–46.0)
Lymphs Abs: 2.5 K/uL (ref 0.7–4.0)
MCHC: 31.4 g/dL (ref 30.0–36.0)
MCV: 76 fl — ABNORMAL LOW (ref 78.0–100.0)
Monocytes Absolute: 0.5 K/uL (ref 0.1–1.0)
Monocytes Relative: 6.6 % (ref 3.0–12.0)
Neutro Abs: 3.7 K/uL (ref 1.4–7.7)
Neutrophils Relative %: 52.4 % (ref 43.0–77.0)
Platelets: 374 K/uL (ref 150.0–400.0)
RBC: 4.65 Mil/uL (ref 3.87–5.11)
RDW: 16.1 % — ABNORMAL HIGH (ref 11.5–15.5)
WBC: 7.1 K/uL (ref 4.0–10.5)

## 2014-06-19 NOTE — Patient Instructions (Addendum)
Please remember to go to the lab and x-ray department downstairs for your tests - we will call you with the results when they are available.     Please see patient coordinator before you leave today  to schedule methacholine challenge  If those are negative and still aggravated by the cough we will  Be referring you to the throat specialist at [place] ahead and take the prednisone now while you heavily suppress the cycle of coughing

## 2014-06-19 NOTE — Progress Notes (Signed)
Subjective:   Patient ID: Deborah Ortega, female    DOB: Jun 17, 1966  MRN: [id number]    Brief patient profile:  48 yowf never smoker with onset  Early 2000's of sneezing and itching spring = fall not better on clariton and zyrtec never allergy eval then cough x around 2010 which is about the same time  she underwent craniotomy posteriorly for meningioma referred 12/26/2013 by Dr Basilio Cairo to pulmonary clinic.   History of Present Illness  12/26/2013 1st [facility] office visit/ Shivansh Hardaway  Chief Complaint  Patient presents with  . Pulmonary Consult    Referred per Dr. Sherrye Payor. Pt c/o cough x 4-5 years, seems to be gradually getting worse. Cough is triggered by eating and talking. Cough is non prod, and sometimes coughs until the point of vomiting.   sister has exact same pattern, mother has bad gerd - pt no better previously on PPI but chewing mint gum, cough worse with chocolate rec Pantoprazole (protonix) 40 mg   Take 30-60 min before first meal of the day and Pepcid 20 mg one bedtime and chloretrimeton 4 mg take 2 at bedtime  until return to office - this is the best way to tell whether stomach acid is contributing to your problem.   Prednisone 10 mg take  4 each am x 2 days,   2 each am x 2 days,  1 each am x 2 days and stop Take delsym two tsp every 12 hours and supplement if needed with  tramadol 50 mg up to 2 every 4 hours to suppress the urge to cough. Swallowing water or using ice chips/non mint and menthol containing candies (such as lifesavers or sugarless jolly ranchers) are also effective.  You should rest your voice and avoid activities that you know make you cough. Once you have eliminated the cough for 3 straight days try reducing the tramadol first   01/23/2014 f/u ov/Itha Kroeker re: cough x 4 y  Chief Complaint  Patient presents with  . Follow-up    Pt states cough has improved slightly since last visit.  She feels it was better while on prednisone. No new co's today.    never took tramadol at all / noct cough is gone / never able to eliminate daytime urge to clear throat and cough   [name]  Do you awaken from a sound sleep coughing violently?                            With trouble breathing? Resolved    Do you have choking episodes when you cannot  Get enough air, gasping for air ?              Yes, still daytime   Do you usually cough when you lie down into  The bed, or when you just lie down to rest ?                          better   Do you usually cough after meals or eating?         Yes, still    Do you cough when (or after) you bend over?    no   GERD SCORE     Kouffman Reflux v Neurogenic Cough Differentiator Neurogenic   Do you more-or-less cough all day long? Spells    Does change of temperature make  you cough? Hot worse   Does laughing or chuckling cause you to cough? sometimes   Do fumes (perfume, automobile fumes, burned  Toast, etc.,) cause you to cough ?      yes   Does speaking, singing, or talking on the phone cause you to cough   ?               yes   Neurogenic/Airway score      rec First take delsym two tsp every 12 hours and supplement if needed with  tramadol 50 mg up to 2 every 4 hours  .   Gabapentin 100 mg three times a day until return  Prednisone 10 mg take  4 each am x 2 days,   2 each am x 2 days,  1 each am x 2 days and stop (this is to eliminate allergies and inflammation from coughing) Protonix (pantoprazole) Take 30-60 min before first meal of the day and Pepcid 20 mg one bedtime plus chlorpheniramine 4 mg x 1 at bedtime (both available over the counter)  until cough is completely gone for at least a week without the need for cough suppression GERD diet    03/05/2014 f/u ov/Brennon Otterness re: cough x 5 years Chief Complaint  Patient presents with  . Follow-up    Pt states that her cough has improved, but not resolved. No new co's today.   not able to take tramadol  and neurontin together but tessilon twice daily still having cough to point vomiting Cough more day than night  rec tessilon 200 four times day until no coughing at all x one week  Increase neurontin to four times (bfast lunch supper and bedtime ) Change the night time routine to take pepcid 20 mg and 2 x chloretrimeton one hour before bedtime  Please schedule a follow up office visit in 4 weeks, sooner if needed    03/25/2014 f/u ov/Syler Norcia re: cough x 5 years  Chief Complaint  Patient presents with  . Acute Visit    Pt states that her cough is still not improving. She had time off of work and so tried taking the tramadol- she took 1/2 tablet and went a whole day w/o cough. She states that the next day she tried taking med again and this caused vomiting. Now she is taking 1/4 tablet and no more vomiting, but still has the cough.   not coughing at all HS using one chloretrimeton and pepcd at bedtime but can't use chortrimeton daytime ? 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  rec If the cough is not completely gone reasonable to try phenergan with tramadol every 4h but take  Prednisone 10 mg take  4 each am x 2 days,   2 each am x 2 days,  1 each am x 2 days and stop    05/08/2014 f/u ov/Torryn Fiske re:  Chief Complaint  Patient presents with  . Follow-up    Pt states that her cough is worse since she had developed a cold a wk ago. She has been zithromax and this helped somewhat.      No obvious  other patterns in day to day or daytime variabilty or assoc sob  or cp or chest tightness, subjective wheeze overt sinus or hb symptoms. No unusual exp hx or h/o childhood pna/ asthma or knowledge of premature birth.  Sleeping ok without nocturnal  or early am exacerbation  of respiratory  c/o's or need for noct saba. Also denies any obvious fluctuation of symptoms with weather or environmental changes or other aggravating or alleviating factors except as  outlined above   Current Medications, Allergies, Complete Past Medical History, Past Surgical History, Family History, and Social History were reviewed in [employer] record.  ROS  The following are not active complaints unless bolded sore throat, dysphagia, dental problems, itching, sneezing,  nasal congestion or excess/ purulent secretions, ear ache,   fever, chills, sweats, unintended wt loss, pleuritic or exertional cp, hemoptysis,  orthopnea pnd or leg swelling, presyncope, palpitations, heartburn, abdominal pain, anorexia, nausea, vomiting, diarrhea  or change in bowel or urinary habits, change in stools or urine, dysuria,hematuria,  rash, arthralgias, visual complaints, headache, numbness weakness or ataxia or problems with walking or coordination,  change in mood/affect or memory.                   Objective:   Physical Exam  amb wf nad    01/23/14            115 >  03/05/2014   117 > 03/25/2014  120 > 05/08/2014  Wt Readings from Last 3 Encounters:  12/26/13 115 lb (52.164 kg)  11/19/13 113 lb (51.256 kg)  11/19/13 113 lb (51.256 kg)      HEENT: nl dentition, turbinates, and oropharynx with min cobblestoning, no excess pnds   Nl external ear canals without cough reflex   NECK :  without JVD/Nodes/TM/ nl carotid upstrokes bilaterally   LUNGS: no acc muscle use, clear to A and P bilaterally with cough on insp   CV:  RRR  no s3 or murmur or increase in P2, no edema   ABD:  soft and nontender with nl excursion in the supine position. No bruits or organomegaly, bowel sounds nl  MS:  warm without deformities, calf tenderness, cyanosis or clubbing      cxr 08/14/13 The heart size and mediastinal contours are within normal limits.  Both lungs are clear. The visualized skeletal structures are  unremarkable.            Assessment & Plan:    Subjective:   Patient ID: Deborah Ortega, female    DOB: Jul 03, 1966  MRN: [id number]    Brief  patient profile:  57 yowf never smoker with onset  Early 2000's of sneezing and itching spring = fall not better on clariton and zyrtec never allergy eval then cough x around 2010 which is about the same time  she underwent craniotomy posteriorly for meningioma referred 12/26/2013 by Dr Basilio Cairo to pulmonary clinic.   History of Present Illness  12/26/2013 1st [facility] office visit/ Abyan Cadman  Chief Complaint  Patient presents with  . Pulmonary Consult    Referred per Dr. Sherrye Payor. Pt c/o cough x 4-5 years, seems to be gradually getting worse. Cough is triggered by eating and talking. Cough is non prod, and sometimes coughs until the point of vomiting.   sister has exact same pattern, mother has bad gerd - pt no better previously on PPI but chewing mint gum, cough worse with chocolate rec Pantoprazole (protonix) 40 mg   Take 30-60 min before first meal of the day and Pepcid 20 mg one bedtime and chloretrimeton 4 mg take 2 at bedtime  until return to office - this is the best way to tell whether stomach acid is contributing to your problem.   Prednisone 10 mg take  4 each am  x 2 days,   2 each am x 2 days,  1 each am x 2 days and stop Take delsym two tsp every 12 hours and supplement if needed with  tramadol 50 mg up to 2 every 4 hours to suppress the urge to cough. Swallowing water or using ice chips/non mint and menthol containing candies (such as lifesavers or sugarless jolly ranchers) are also effective.  You should rest your voice and avoid activities that you know make you cough. Once you have eliminated the cough for 3 straight days try reducing the tramadol first   01/23/2014 f/u ov/Colbin Jovel re: cough x 4 y  Chief Complaint  Patient presents with  . Follow-up    Pt states cough has improved slightly since last visit.  She feels it was better while on prednisone. No new co's today.   never took tramadol at all / noct cough is gone / never able to eliminate daytime urge to clear  throat and cough   [name]  Do you awaken from a sound sleep coughing violently?                            With trouble breathing? Resolved    Do you have choking episodes when you cannot  Get enough air, gasping for air ?              Yes, still daytime   Do you usually cough when you lie down into  The bed, or when you just lie down to rest ?                          better   Do you usually cough after meals or eating?         Yes, still    Do you cough when (or after) you bend over?    no   GERD SCORE     Kouffman Reflux v Neurogenic Cough Differentiator Neurogenic   Do you more-or-less cough all day long? Spells    Does change of temperature make you cough? Hot worse   Does laughing or chuckling cause you to cough? sometimes   Do fumes (perfume, automobile fumes, burned  Toast, etc.,) cause you to cough ?      yes   Does speaking, singing, or talking on the phone cause you to cough   ?               yes   Neurogenic/Airway score      rec First take delsym two tsp every 12 hours and supplement if needed with  tramadol 50 mg up to 2 every 4 hours  .   Gabapentin 100 mg three times a day until return  Prednisone 10 mg take  4 each am x 2 days,   2 each am x 2 days,  1 each am x 2 days and stop (this is to eliminate allergies and inflammation from coughing) Protonix (pantoprazole) Take 30-60 min before first meal of the day and Pepcid 20 mg one bedtime plus chlorpheniramine 4 mg x 1 at bedtime (both available over the counter)  until cough is completely gone for at least a week without the need for cough suppression GERD diet    03/05/2014 f/u ov/Oral Hallgren re: cough x 5 years Chief Complaint  Patient presents with  . Follow-up    Pt states  that her cough has improved, but not resolved. No new co's today.   not able to take tramadol and neurontin together but tessilon twice daily still having cough to point vomiting Cough more day  than night  rec tessilon 200 four times day until no coughing at all x one week  Increase neurontin to four times (bfast lunch supper and bedtime ) Change the night time routine to take pepcid 20 mg and 2 x chloretrimeton one hour before bedtime  Please schedule a follow up office visit in 4 weeks, sooner if needed    03/25/2014 f/u ov/Alfonza Toft re: cough x 5 years  Chief Complaint  Patient presents with  . Acute Visit    Pt states that her cough is still not improving. She had time off of work and so tried taking the tramadol- she took 1/2 tablet and went a whole day w/o cough. She states that the next day she tried taking med again and this caused vomiting. Now she is taking 1/4 tablet and no more vomiting, but still has the cough.   not coughing at all HS using one chloretrimeton and pepcd at bedtime but can't use chortrimeton daytime ? 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  rec Prednisone 10 mg take  4 each am x 2 days,   2 each am x 2 days,  1 each am x 2 days and stop and use tramadol / phenergan prn (didn't need it)    05/08/2014 f/u ov/Caydence Enck re: cough x 5 years  Chief Complaint  Patient presents with  . Follow-up    Pt states that her cough is worse since she had developed a cold a wk ago. She has been zithromax and this helped somewhat.   was better  But never able to stop tessilon and then acutely ill p exp to sick son x one week prior to OV   Not clear that she's using neurontin consistently but does seem to help and does tolerate the 100 mg strength. rec rec Continue tessilon 200 mg up to every 4 hours / supplement with tramadol /phenergan if needed as needed for nausea  Prednisone 10 mg take  4 each am x 2 days,   2 each am x 2 days,  1 each am x 2 days and stop   06/19/2014 f/u ov/Burnard Enis re: cough x 5 y "don't remember when I wasn't coughing  Chief Complaint  Patient presents with  . Follow-up    Pt states that her cough is  unchanged. Unable to take tramadol due to her work and home schedule.   never took prednisone or tramadol in suppressive doses but with pepcid and chloretrimeton x 4 mg sleeps fine  Cough mostly provoked by voice use Not able to tolerate increased doses of neurontin s feeling groggy    No obvious  other patterns in day to day or daytime variabilty or assoc sob (unless coughing)  or cp or chest tightness, subjective wheeze overt sinus or hb symptoms. No unusual exp hx or h/o childhood pna/ asthma or knowledge of premature birth.  Sleeping ok without nocturnal  or early am exacerbation  of respiratory  c/o's or need for noct saba. Also denies any obvious fluctuation of symptoms with weather or environmental changes or other aggravating or alleviating factors except as outlined above   Current Medications, Allergies, Complete Past Medical History, Past Surgical History, Family History, and Social History were reviewed  in [employer] record.  ROS  The following are not active complaints unless bolded sore throat, dysphagia, dental problems, itching, sneezing,  nasal congestion or excess/ purulent secretions, ear ache,   fever, chills, sweats, unintended wt loss, pleuritic or exertional cp, hemoptysis,  orthopnea pnd or leg swelling, presyncope, palpitations, heartburn, abdominal pain, anorexia, nausea, vomiting, diarrhea  or change in bowel or urinary habits, change in stools or urine, dysuria,hematuria,  rash, arthralgias, visual complaints, headache, numbness weakness or ataxia or problems with walking or coordination,  change in mood/affect or memory.                   Objective:   Physical Exam  amb wf nad occ throat clearing, no cough during interview or exam ? Belle affect ?   01/23/14             115 >  03/05/2014   117 > 03/25/2014  120 >  05/08/2014  120> 06/19/2014 119  Wt Readings from Last 3 Encounters:  12/26/13 115 lb (52.164 kg)  11/19/13 113 lb (51.256 kg)   11/19/13 113 lb (51.256 kg)      HEENT: nl dentition, turbinates, and oropharynx with min cobblestoning, no excess pnds   Nl external ear canals without cough reflex   NECK :  without JVD/Nodes/TM/ nl carotid upstrokes bilaterally   LUNGS: no acc muscle use, clear to A and P bilaterally with no cough on insp   CV:  RRR  no s3 or murmur or increase in P2, no edema   ABD:  soft and nontender with nl excursion in the supine position. No bruits or organomegaly, bowel sounds nl  MS:  warm without deformities, calf tenderness, cyanosis or clubbing      CXR  06/19/2014 :  No acute changes            Assessment & Plan:

## 2014-06-20 ENCOUNTER — Telehealth: Payer: Self-pay | Admitting: Internal Medicine

## 2014-06-20 LAB — ALLERGY FULL PROFILE
ALLERGEN, D PTERNOYSSINUS, D1: 0.31 kU/L — AB
Allergen,Goose feathers, e70: <0.1 kU/L
Alternaria Alternata: <0.1 kU/L
Aspergillus fumigatus, m3: <0.1 kU/L
Bermuda Grass: 0.1 kU/L — ABNORMAL HIGH
Candida Albicans: <0.1 kU/L
Curvularia lunata: <0.1 kU/L
D. farinae: 0.27 kU/L — ABNORMAL HIGH
Dog Dander: <0.1 kU/L
G005 Rye, Perennial: <0.1 kU/L
Goldenrod: <0.1 kU/L
Helminthosporium halodes: <0.1 kU/L
IgE (Immunoglobulin E), Serum: 86 kU/L (ref <115)
Lamb's Quarters: <0.1 kU/L
Plantain: <0.1 kU/L
Stemphylium Botryosum: <0.1 kU/L
Timothy Grass: <0.1 kU/L

## 2014-06-20 NOTE — Telephone Encounter (Signed)
Pt returned Leslie's call. She states she thinks Magda Paganini has returned call & she missed it - [phone]

## 2014-06-20 NOTE — Telephone Encounter (Signed)
Pt returned my call.  I gave her appt info & location.  I advised her to follow the PFT instruction sheet I had given her on 06/19/14 when she was in my office, pt voiced understanding Kara Mead

## 2014-06-20 NOTE — Telephone Encounter (Signed)
I spoke with patient about results and she verbalized understanding and had no questions. She reports Methacholine challenge test was suppose to be scheduled over at [facility]. Please advise PCC;s thanks

## 2014-06-20 NOTE — Telephone Encounter (Signed)
LMTCB on pt's cell today @8 :33 am to give her appt info for Methacholine Challenge.  Pt returned my call while I had a pt in my office & LVM.  I have tried several times to reach pt on her cell & home number and left a 2nd message to call me back, pt has not responded Dawne J Law

## 2014-06-20 NOTE — Assessment & Plan Note (Signed)
-    Cyclical cough regimen  12/26/2013  > not completed as instructed -  01/23/14  added neurontin 100 mg tid > increased to qid 03/06/2014  - 4/59/9774 repeat cyclical cough regimen with tramadol/phenergan> did not take pred with it   I had an extended discussion with the patient today lasting 15 to 20 minutes of a 25 minute visit on the following issues: The standardized cough guidelines published in Chest by Lissa Morales in 2006 are still the best available and consist of a multiple step process (up to 12!) , not a single office visit,  and are intended  to address this problem logically,  with an alogrithm dependent on response to empiric treatment at  each progressive step  to determine a specific diagnosis with  minimal addtional testing needed. Therefore if adherence is an issue or can't be accurately verified,  it's very unlikely the standard evaluation and treatment will be successful here.    Furthermore, response to therapy (other than acute cough suppression, which should only be used short term with avoidance of narcotic containing cough syrups if possible), can be a gradual process for which the patient may perceive immediate benefit.  Unlike going to an eye doctor where the best perscription is almost always the first one and is immediately effective, this is almost never the case in the management of chronic cough syndromes. Therefore the patient needs to commit up front to consistently adhere to recommendations  for up to 6 weeks (has not yet committed to 6 days) of therapy directed at the likely underlying problem(s) before the response can be reasonably evaluated.  Will complete the w/u here with allergy profile and MCT then refer to [facility] next

## 2014-06-20 NOTE — Telephone Encounter (Signed)
Result Note     Call patient : Studies are c/w mild allergies to dust and barely pos to [place] grass. Dust avoidance measures are best bet (best sources for these measures are on line   lmtcb x1

## 2014-06-20 NOTE — Telephone Encounter (Signed)
Left message for Deborah Ortega in respitory to schedule and call me back Deborah Ortega

## 2014-06-24 ENCOUNTER — Ambulatory Visit (HOSPITAL_COMMUNITY): Payer: 59 | Admitting: Anesthesiology

## 2014-06-24 ENCOUNTER — Encounter (HOSPITAL_COMMUNITY): Payer: Self-pay | Admitting: *Deleted

## 2014-06-24 ENCOUNTER — Encounter (HOSPITAL_COMMUNITY)
Admission: RE | Disposition: A | Discharge status: Home / Self Care | Payer: Self-pay | Source: Ambulatory Visit | Attending: Urology

## 2014-06-24 ENCOUNTER — Ambulatory Visit (HOSPITAL_COMMUNITY)
Admission: RE | Admit: 2014-06-24 | Discharge: 2014-06-24 | Disposition: A | Discharge status: Home / Self Care | Payer: 59 | Source: Ambulatory Visit | Attending: Urology | Admitting: Urology

## 2014-06-24 ENCOUNTER — Other Ambulatory Visit: Payer: Self-pay | Admitting: Urology

## 2014-06-24 DIAGNOSIS — F329 Major depressive disorder, single episode, unspecified: Secondary | ICD-10-CM | POA: Diagnosis not present

## 2014-06-24 DIAGNOSIS — K219 Gastro-esophageal reflux disease without esophagitis: Secondary | ICD-10-CM | POA: Insufficient documentation

## 2014-06-24 DIAGNOSIS — G43909 Migraine, unspecified, not intractable, without status migrainosus: Secondary | ICD-10-CM | POA: Diagnosis not present

## 2014-06-24 DIAGNOSIS — E041 Nontoxic single thyroid nodule: Secondary | ICD-10-CM | POA: Diagnosis not present

## 2014-06-24 DIAGNOSIS — N202 Calculus of kidney with calculus of ureter: Secondary | ICD-10-CM | POA: Insufficient documentation

## 2014-06-24 HISTORY — DX: Personal history of urinary calculi: Z87.442

## 2014-06-24 HISTORY — DX: Major depressive disorder, single episode, unspecified: F32.9

## 2014-06-24 HISTORY — DX: Depression, unspecified: F32.A

## 2014-06-24 HISTORY — DX: Unspecified asthma, uncomplicated: J45.909

## 2014-06-24 HISTORY — DX: Anxiety disorder, unspecified: F41.9

## 2014-06-24 LAB — PREGNANCY, URINE: Preg Test, Ur: NEGATIVE

## 2014-06-24 SURGERY — CYSTOSCOPY/URETEROSCOPY/HOLMIUM LASER/STENT PLACEMENT
Anesthesia: General | Site: Ureter | Laterality: Left

## 2014-06-24 MED ORDER — DEXAMETHASONE SODIUM PHOSPHATE 10 MG/ML IJ SOLN
INTRAMUSCULAR | Status: DC | PRN
  Administered 2014-06-24: 10 mg via INTRAVENOUS

## 2014-06-24 MED ORDER — FENTANYL CITRATE 0.05 MG/ML IJ SOLN
INTRAMUSCULAR | Status: DC | PRN
  Administered 2014-06-24: 50 ug via INTRAVENOUS

## 2014-06-24 MED ORDER — PHENAZOPYRIDINE HCL 200 MG PO TABS: 200.0000 mg | ORAL_TABLET | Freq: Three times a day (TID) | ORAL | Status: DC | PRN

## 2014-06-24 MED ORDER — GLYCOPYRROLATE 0.2 MG/ML IJ SOLN
INTRAMUSCULAR | Status: AC
  Filled 2014-06-24: qty 2

## 2014-06-24 MED ORDER — GLYCOPYRROLATE 0.2 MG/ML IJ SOLN
INTRAMUSCULAR | Status: DC | PRN
  Administered 2014-06-24: .3 mg via INTRAVENOUS

## 2014-06-24 MED ORDER — DIATRIZOATE MEGLUMINE 30 % UR SOLN
URETHRAL | Status: DC | PRN
  Administered 2014-06-24: 3 mL via URETHRAL

## 2014-06-24 MED ORDER — ONDANSETRON HCL 4 MG/2ML IJ SOLN
INTRAMUSCULAR | Status: AC
  Filled 2014-06-24: qty 2

## 2014-06-24 MED ORDER — PROPOFOL 10 MG/ML IV BOLUS
INTRAVENOUS | Status: AC
  Filled 2014-06-24: qty 20

## 2014-06-24 MED ORDER — LIDOCAINE HCL (CARDIAC) 20 MG/ML IV SOLN
INTRAVENOUS | Status: DC | PRN
  Administered 2014-06-24: 60 mg via INTRAVENOUS

## 2014-06-24 MED ORDER — CEFAZOLIN SODIUM-DEXTROSE 2-3 GM-% IV SOLR
INTRAVENOUS | Status: AC
  Filled 2014-06-24: qty 50

## 2014-06-24 MED ORDER — FENTANYL CITRATE 0.05 MG/ML IJ SOLN
INTRAMUSCULAR | Status: AC
  Filled 2014-06-24: qty 2

## 2014-06-24 MED ORDER — HYOSCYAMINE SULFATE 0.125 MG SL SUBL: 0.1250 mg | SUBLINGUAL_TABLET | SUBLINGUAL | Status: DC | PRN

## 2014-06-24 MED ORDER — MIDAZOLAM HCL 2 MG/2ML IJ SOLN
INTRAMUSCULAR | Status: AC
  Filled 2014-06-24: qty 2

## 2014-06-24 MED ORDER — SUCCINYLCHOLINE CHLORIDE 20 MG/ML IJ SOLN
INTRAMUSCULAR | Status: DC | PRN
  Administered 2014-06-24: 100 mg via INTRAVENOUS

## 2014-06-24 MED ORDER — EPHEDRINE SULFATE 50 MG/ML IJ SOLN
INTRAMUSCULAR | Status: DC | PRN
  Administered 2014-06-24: 5 mg via INTRAVENOUS

## 2014-06-24 MED ORDER — DEXAMETHASONE SODIUM PHOSPHATE 10 MG/ML IJ SOLN
INTRAMUSCULAR | Status: AC
  Filled 2014-06-24: qty 1

## 2014-06-24 MED ORDER — SODIUM CHLORIDE 0.9 % IR SOLN
Status: DC | PRN
  Administered 2014-06-24: 4000 mL

## 2014-06-24 MED ORDER — ONDANSETRON HCL 4 MG/2ML IJ SOLN
INTRAMUSCULAR | Status: DC | PRN
  Administered 2014-06-24: 4 mg via INTRAVENOUS

## 2014-06-24 MED ORDER — MIDAZOLAM HCL 5 MG/5ML IJ SOLN
INTRAMUSCULAR | Status: DC | PRN
  Administered 2014-06-24: 2 mg via INTRAVENOUS

## 2014-06-24 MED ORDER — PROPOFOL 10 MG/ML IV BOLUS
INTRAVENOUS | Status: DC | PRN
  Administered 2014-06-24: 130 mg via INTRAVENOUS

## 2014-06-24 MED ORDER — NEOSTIGMINE METHYLSULFATE 10 MG/10ML IV SOLN
INTRAVENOUS | Status: DC | PRN
  Administered 2014-06-24: 2.5 mg via INTRAVENOUS

## 2014-06-24 MED ORDER — HYDROMORPHONE HCL 2 MG PO TABS: 2.0000 mg | ORAL_TABLET | ORAL | Status: DC | PRN

## 2014-06-24 MED ORDER — LIDOCAINE HCL (CARDIAC) 20 MG/ML IV SOLN
INTRAVENOUS | Status: AC
  Filled 2014-06-24: qty 5

## 2014-06-24 MED ORDER — HYOSCYAMINE SULFATE 0.125 MG SL SUBL
0.1250 mg | SUBLINGUAL_TABLET | SUBLINGUAL | Status: DC | PRN
  Administered 2014-06-24: 0.125 mg via SUBLINGUAL
  Filled 2014-06-24 (×2): qty 1

## 2014-06-24 MED ORDER — LACTATED RINGERS IV SOLN
INTRAVENOUS | Status: DC | PRN
  Administered 2014-06-24: 20:00:00 via INTRAVENOUS

## 2014-06-24 MED ORDER — CISATRACURIUM BESYLATE (PF) 10 MG/5ML IV SOLN
INTRAVENOUS | Status: DC | PRN
  Administered 2014-06-24: 4 mg via INTRAVENOUS

## 2014-06-24 MED ORDER — CEFAZOLIN SODIUM 1-5 GM-% IV SOLN
1.0000 g | INTRAVENOUS | Status: AC
  Administered 2014-06-24: 2 g via INTRAVENOUS

## 2014-06-24 MED ORDER — PROMETHAZINE HCL 25 MG PO TABS
25.0000 mg | ORAL_TABLET | Freq: Four times a day (QID) | ORAL | Status: DC | PRN
  Administered 2014-06-24: 25 mg via ORAL
  Filled 2014-06-24 (×2): qty 1

## 2014-06-24 SURGICAL SUPPLY — 25 items
BAG URO CATCHER STRL LF (DRAPE) ×4 IMPLANT
BASKET ZERO TIP NITINOL 2.4FR (BASKET) ×3 IMPLANT
BSKT STON RTRVL ZERO TP 2.4FR (BASKET) ×2
CATH URET 5FR 28IN OPEN ENDED (CATHETERS) ×3 IMPLANT
CLOTH BEACON ORANGE TIMEOUT ST (SAFETY) ×7 IMPLANT
DRAPE CAMERA CLOSED 9X96 (DRAPES) ×4 IMPLANT
FIBER LASER FLEXIVA 1000 (UROLOGICAL SUPPLIES) ×1 IMPLANT
FIBER LASER FLEXIVA 200 (UROLOGICAL SUPPLIES) ×1 IMPLANT
FIBER LASER FLEXIVA 365 (UROLOGICAL SUPPLIES) ×1 IMPLANT
FIBER LASER FLEXIVA 550 (UROLOGICAL SUPPLIES) ×1 IMPLANT
FIBER LASER TRAC TIP (UROLOGICAL SUPPLIES) ×1 IMPLANT
GLOVE BIOGEL PI IND STRL 6 (GLOVE) ×2 IMPLANT
GLOVE BIOGEL PI INDICATOR 6 (GLOVE) ×4
GLOVE SURG SS PI 8.0 STRL IVOR (GLOVE) ×3 IMPLANT
GOWN STRL REUS W/TWL XL LVL3 (GOWN DISPOSABLE) ×4 IMPLANT
IV NS 1000ML (IV SOLUTION) ×4
IV NS 1000ML BAXH (IV SOLUTION) ×1 IMPLANT
IV NS IRRIG 3000ML ARTHROMATIC (IV SOLUTION) ×3 IMPLANT
MANIFOLD NEPTUNE II (INSTRUMENTS) ×4 IMPLANT
NS IRRIG 1000ML POUR BTL (IV SOLUTION) ×3 IMPLANT
PACK CYSTO (CUSTOM PROCEDURE TRAY) ×4 IMPLANT
SHEATH ACCESS URETERAL 38CM (SHEATH) ×3 IMPLANT
STENT PERCUFLEX 4.8FRX26 (STENTS) ×3 IMPLANT
TUBING CONNECTING 10 (TUBING) ×3 IMPLANT
TUBING CONNECTING 10' (TUBING) ×1

## 2014-06-24 NOTE — Transfer of Care (Signed)
Immediate Anesthesia Transfer of Care Note  Patient: Deborah Ortega  Procedure(s) Performed: Procedure(s): CYSTOSCOPY/LEFT URETEROSCOPYBasket Stone extraction/Left Ureteral STENT PLACEMENT (Left)  Patient Location: PACU  Anesthesia Type:General  Level of Consciousness: awake, alert , oriented and patient cooperative  Airway & Oxygen Therapy: Patient Spontanous Breathing and Patient connected to face mask oxygen  Post-op Assessment: Report given to PACU RN, Post -op Vital signs reviewed and stable and Patient moving all extremities  Post vital signs: Reviewed and stable  Complications: No apparent anesthesia complications

## 2014-06-24 NOTE — Brief Op Note (Signed)
06/24/2014  8:14 PM  PATIENT:  Elenor Quinones  48 y.o. female  PRE-OPERATIVE DIAGNOSIS:  left ureteral stone  POST-OPERATIVE DIAGNOSIS:  left ureteral stone  PROCEDURE:  Procedure(s): CYSTOSCOPY/LEFT URETEROSCOPYBasket Stone extraction/Left Ureteral STENT PLACEMENT (Left)  SURGEON:  Surgeon(s) and Role:    * Malka So, MD - Primary  PHYSICIAN ASSISTANT:   ASSISTANTS: none   ANESTHESIA:   general  EBL:     BLOOD ADMINISTERED:none  DRAINS: 4.8 x 26 fr L JJ stent   LOCAL MEDICATIONS USED:  NONE  SPECIMEN:  Source of Specimen:  ureteral stones  DISPOSITION OF SPECIMEN:  to family to bring to office  COUNTS:  YES  TOURNIQUET:  * No tourniquets in log *  DICTATION: .Other Dictation: Dictation Number [id number]  PLAN OF CARE: Discharge to home after PACU  PATIENT DISPOSITION:  PACU - hemodynamically stable.   Delay start of Pharmacological VTE agent (>24hrs) due to surgical blood loss or risk of bleeding: not applicable

## 2014-06-24 NOTE — Progress Notes (Signed)
Patient and husband informed that surgery has been delayed by one hour. They verbalize understanding.

## 2014-06-24 NOTE — Discharge Instructions (Signed)
Ureteral Stent Implantation Ureteral stent implantation is the implantation of a soft plastic tube with multiple holes into the tube that drains urine from your kidney to your bladder (ureter). The stent helps drain your kidney when there is a blockage of the flow of urine in your ureter. The stent has a coil on each end to keep it from falling out. One end stays in the kidney. The other end stays in the bladder. It is most often taken out after any blockage has been removed or your ureter has healed. Short-term stents have a string attached to make removal quite easy. Removal of a short-term stent can be done in your health care provider's office or by you at home. Long-term stents need to be changed every few months. LET [facility] CARE PROVIDER KNOW ABOUT:  Any allergies you have.  All medicines you are taking, including vitamins, herbs, eye drops, creams, and over-the-counter medicines.  Previous problems you or members of your family have had with the use of anesthetics.  Any blood disorders you have.  Previous surgeries you have had.  Medical conditions you have. RISKS AND COMPLICATIONS Generally, ureteral stent implantation is a safe procedure. However, as with any procedure, complications can occur. Possible complications include:  Movement of the stent away from where it was originally placed (migration). This may affect the ability of the stent to properly drain your kidney. If migration of the stent occurs, the stent may need to be replaced or repositioned.  Perforation of the ureter.  Infection. BEFORE THE PROCEDURE  You may be asked to wash your genital area with sterile soap the morning of your procedure.  You may be given an oral antibiotic which you should take with a sip of water as prescribed by your health care provider.  You may be asked to not eat or drink for 8 hours before the surgery. PROCEDURE  First you will be given an anesthetic so you do not feel pain  during the procedure.  Your health care provider will insert a special lighted instrument called a cystoscope into your bladder. This allows your health care provider to see the opening to your ureter.  A thin wire is carefully threaded into your bladder and up the ureter. The stent is inserted over the wire and the wire is then removed.  Your bladder will be emptied of urine. AFTER THE PROCEDURE You will be taken to a recovery room until it is okay for you to go home. Document Released: 07/30/2000 Document Revised: 08/07/2013 Document Reviewed: 01/09/2013 [facility] Patient Information 2015 [place], [place]. This information is not intended to replace advice given to you by your health care provider. Make sure you discuss any questions you have with your health care provider.

## 2014-06-24 NOTE — H&P (Signed)
History of Present Illness This woman is working the day for 24 hour history of sudden onset of left flank pain, colicky in nature, associated with nausea and vomiting. No fever, no chills, no sudden onset of lower urinary tract symptomatology. She has no right-sided symptoms. She does have a history of prior kidney stones, having passed 2 previously. These did not need surgical intervention. She has only taken Tylenol for her pain.   Past Medical History Problems  1. History of Calculus of ureter (N20.1) 2. History of depression (Z86.59) 3. History of esophageal reflux (Z87.19) 4. History of Meningioma (D32.9)  Surgical History Problems  1. History of Breast Surgery Lumpectomy 2. History of Cesarean Section 3. History of Craniotomy (Therapeutic)  Current Meds 1. Claritin 10 MG Oral Tablet;  Therapy: (Recorded:10Nov2008) to Recorded 2. Lexapro TABS;  Therapy: (Recorded:09Nov2015) to Recorded 3. Neurontin CAPS;  Therapy: (Recorded:09Nov2015) to Recorded 4. Pepcid TABS;  Therapy: (Recorded:09Nov2015) to Recorded 5. Tessalon Perles 100 MG Oral Capsule;  Therapy: (Recorded:09Nov2015) to Recorded 6. TraMADol HCl TABS;  Therapy: (Recorded:09Nov2015) to Recorded  Allergies Medication  1. Morphine Derivatives 2. Levaquin TABS  Family History Problems  1. Family history of kidney stones (Z84.1) : Mother, Uncle 2. Family history of Urinary Calculus : Mother 3. Family history of Urinary Calculus : Maternal Uncle 4. Family history of Urinary Calculus : Maternal Grandfather  Social History Problems    Denied: Alcohol Use   Caffeine Use   1   Marital History - Currently Married   Never a smoker   Number of children   1 daughter & 1 son   Occupation   Pharmacist, hospital   Denied: Tobacco Use  Review of Systems Genitourinary, constitutional, skin, eye, otolaryngeal, hematologic/lymphatic, cardiovascular, pulmonary, endocrine, musculoskeletal, gastrointestinal, neurological  and psychiatric system(s) were reviewed and pertinent findings if present are noted and are otherwise negative.  Gastrointestinal: nausea and vomiting.  Musculoskeletal: back pain.    Vitals Vital Signs [Data Includes: Last 1 Day]  Recorded: [id number] 12:26PM  Height: 5 ft 3 in Weight: 117 lb  BMI Calculated: 20.73 BSA Calculated: 1.54 Blood Pressure: 105 / 71 Temperature: 98.3 F Heart Rate: 76  Physical Exam Constitutional: Well nourished and well developed . No acute distress.  ENT:. The ears and nose are normal in appearance.  Neck: The appearance of the neck is normal.  Pulmonary: No respiratory distress and normal respiratory rhythm and effort.  Abdomen: The abdomen is flat. The abdomen is soft and nontender. No masses are palpated. No CVA tenderness. No hernias are palpable. No hepatosplenomegaly noted.  Lymphatics: The femoral and inguinal nodes are not enlarged or tender.  Skin: Normal skin turgor, no visible rash and no visible skin lesions.  Neuro/Psych:. Mood and affect are appropriate.    Results/Data Urine [Data Includes: Last 1 Day]   [id number]  COLOR YELLOW   APPEARANCE CLOUDY   SPECIFIC GRAVITY >1.030   pH 5.5   GLUCOSE NEG mg/dL  BILIRUBIN SMALL   KETONE NEG mg/dL  BLOOD LARGE   PROTEIN 100 mg/dL  UROBILINOGEN 0.2 mg/dL  NITRITE NEG   LEUKOCYTE ESTERASE NEG   SQUAMOUS EPITHELIAL/HPF FEW   WBC 3-6 WBC/hpf  RBC TNTC RBC/hpf  BACTERIA FEW   CRYSTALS Calcium Oxalate crystals noted   CASTS NONE SEEN   Other MUCUS NOTED    Urinalysis reveals microscopic hematuria without evidence of infection. There are small stones in each kidney. There are 2 stones in the upper left ureter with hydroureter above. The diameter of  the largest stone is 6 mm. These are fairly proximal. I reviewed the images with the patient.   Assessment Assessed  1. Flank pain, acute (R10.9) 2. Microscopic hematuria (R31.2) 3. Ureteral calculus, left (N20.1) 4. Nephrolithiasis  (N20.0)  1. Left upper ureteral calculi with hydronephrosis. She seems comfortable now, but the complicating factor is she will be leaving town for a vacation in 2 days.    2. Small bilateral renal calculi   Plan Microscopic hematuria  1. AU CT-STONE PROTOCOL; Status:In Progress - Specimen/Data Collected;   Done:  [id number] 12:00AM Nephrolithiasis  2. Follow-up Schedule Surgery Office  Follow-up  Status: Hold For - Appointment   Requested for: [phone]  Discussion/Summary I have discussed management with the patient--obviously, her travel in 48 hours is a concern, as she does not want to miss her medication which she has had for a while. Options include treating her with pain medicines to help her get through her vacation without intervention surgically, versus ureteroscopy and stone extraction versus stenting with later procedure such as lithotripsy. I do not see her ureteral stones on the scout film today, so this may be hard to see with lithotripsy. She has decided on intervention, and we will schedule her for cystoscopy, retrograde, possible ureteroscopy with holmium laser and stent placement. I discussed this with Dr. Irine Seal who will perform the procedure today.

## 2014-06-24 NOTE — Anesthesia Preprocedure Evaluation (Addendum)
Anesthesia Evaluation  Patient identified by MRN, date of birth, ID band Patient awake    Reviewed: Allergy & Precautions, H&P , NPO status , Patient's Chart, lab work & pertinent test results  History of Anesthesia Complications (+) PONV and history of anesthetic complications  Airway Mallampati: II  TM Distance: >3 FB Neck ROM: Full    Dental no notable dental hx.    Pulmonary asthma ,  breath sounds clear to auscultation  Pulmonary exam normal       Cardiovascular negative cardio ROS  Rhythm:Regular Rate:Normal     Neuro/Psych  Headaches, PSYCHIATRIC DISORDERS Anxiety Depression    GI/Hepatic Neg liver ROS, GERD-  Medicated,  Endo/Other  negative endocrine ROS  Renal/GU negative Renal ROS  negative genitourinary   Musculoskeletal negative musculoskeletal ROS (+)   Abdominal   Peds negative pediatric ROS (+)  Hematology negative hematology ROS (+)   Anesthesia Other Findings   Reproductive/Obstetrics negative OB ROS                             Anesthesia Physical Anesthesia Plan  ASA: II  Anesthesia Plan: General   Post-op Pain Management:    Induction: Intravenous  Airway Management Planned: Oral ETT  Additional Equipment:   Intra-op Plan:   Post-operative Plan: Extubation in OR  Informed Consent: I have reviewed the patients History and Physical, chart, labs and discussed the procedure including the risks, benefits and alternatives for the proposed anesthesia with the patient or authorized representative who has indicated his/her understanding and acceptance.   Dental advisory given  Plan Discussed with: CRNA  Anesthesia Plan Comments:        Anesthesia Quick Evaluation

## 2014-06-25 NOTE — Anesthesia Postprocedure Evaluation (Signed)
  Anesthesia Post-op Note  Patient: Deborah Ortega  Procedure(s) Performed: Procedure(s) (LRB): CYSTOSCOPY/LEFT URETEROSCOPYBasket Stone extraction/Left Ureteral STENT PLACEMENT (Left)  Patient Location: PACU  Anesthesia Type: General  Level of Consciousness: awake and alert   Airway and Oxygen Therapy: Patient Spontanous Breathing  Post-op Pain: mild  Post-op Assessment: Post-op Vital signs reviewed, Patient's Cardiovascular Status Stable, Respiratory Function Stable, Patent Airway and No signs of Nausea or vomiting  Last Vitals:  Filed Vitals:   06/24/14 2157  BP: 117/78  Pulse:   Temp: 36.9 C  Resp:     Post-op Vital Signs: stable   Complications: No apparent anesthesia complications

## 2014-06-25 NOTE — Progress Notes (Signed)
Quick Note:  LMTCB ______ 

## 2014-06-25 NOTE — Op Note (Signed)
[name], [name]               ACCOUNT NO.:  [id number]  MEDICAL RECORD NO.:  [id number]  LOCATION:  [facility]                         FACILITY:  [facility]  PHYSICIAN:  Marshall Cork. Jeffie Pollock, M.D.    DATE OF BIRTH:  October 26, 1965  DATE OF PROCEDURE:  06/24/2014 DATE OF DISCHARGE:                              OPERATIVE REPORT   PROCEDURE: 1. Cystoscopy. 2. Left retrograde pyelogram interpretation. 3. Left ureteroscopic stone extraction with left ureteral stent     insertion.  PREOPERATIVE DIAGNOSIS:  Left proximal ureteral stone.  POSTOPERATIVE DIAGNOSIS:  Left proximal ureteral stone.  SURGEON:  Marshall Cork. Jeffie Pollock, MD  ANESTHESIA:  General.  SPECIMEN:  Stone fragments.  DRAINS:  4.8-French by 26 cm left double-J stent.  COMPLICATIONS:  None.  INDICATIONS:  Deborah Ortega is a 48 year old, white female who presented today with intractable nausea and a small cluster of stones in the left proximal ureter that measured approximately 3.6 x 6 mm.  After reviewing the options, she has elected to undergo attempted ureteroscopy.  FINDINGS OF PROCEDURE:  She was given Ancef.  She was taken to the operating room where general anesthetic was induced.  She was placed in lithotomy position and was fitted with PAS hose.  Her perineum and genitalia were prepped with Betadine solution.  She was draped in usual sterile fashion.  Cystoscopy was performed using a 22-French scope and 12-degree lens. Examination revealed a normal urethra.  The bladder wall was smooth and pale without tumor, stones, or inflammation.  The ureteral orifices were unremarkable effluxing clear urine even on the left.  A 5-French open-end catheter was then passed and placed in the left ureteral orifice.  Contrast was instilled.  This revealed a normal caliber ureter up to a filling defect in the proximal ureter consistent with a stone seen on CT.  There was slight angulation of the ureter here but no significant tortuosity.  Contrast  easily flow to the kidney, there was mild hydronephrosis.  Guidewire was then passed to the kidney.  The open-end catheter was passed over the wire to the kidney.  The wire was removed.  There was good hydronephrotic drip confirming intrarenal placement. The wire was then reinserted and the open-end catheter was removed.  The cystoscope was removed and a 12-French introducer sheath inner and a core was then passed over the wire to the level of the kidney until bit of grittiness upon reaching the level of the kidney.  The inner core was then removed and a 6.4-French short ureteroscope was then inserted along the wire, was able to negotiate it up with the aid of a Nitinol basket to the level of the stones.  There appeared to be a crumbled stone in the location of the filling defect on ureteroscopy. This was grasped with a basket and removed.  Repeat inspection revealed some very small fragments, but no additional retrievable fragments.  Inspection of the stone that was removed was consistent with the stone seen on CT and that it was two 3 mm stones that were adjacent to 1 another.  At this point, it was felt that bulk of the stone had been retrieved with only tiny residual fragments remaining  and it was felt that stenting was indicated.  The ureteroscope was removed.  The cystoscope was reinserted over the wire and a 4.8-French 26 cm double-J stent without string was inserted to the kidney under fluoroscopic guidance. The wire was removed leaving good coil in the kidney and a good coil in the bladder.  The bladder was drained.  The patient was taken down from lithotomy position.  Her anesthetic was reversed.  She was moved to recovery room in stable condition.  There were no complications.     Marshall Cork. Jeffie Pollock, M.D.     JJW/MEDQ  D:  06/24/2014  T:  06/25/2014  Job:  [id number]

## 2014-06-27 NOTE — Progress Notes (Signed)
Quick Note:  LMTCB ______ 

## 2014-07-04 ENCOUNTER — Ambulatory Visit (HOSPITAL_COMMUNITY)
Admission: RE | Admit: 2014-07-04 | Discharge: 2014-07-04 | Disposition: A | Discharge status: Home / Self Care | Payer: 59 | Source: Ambulatory Visit | Attending: Internal Medicine | Admitting: Internal Medicine

## 2014-07-04 DIAGNOSIS — R0602 Shortness of breath: Secondary | ICD-10-CM | POA: Diagnosis not present

## 2014-07-04 DIAGNOSIS — R05 Cough: Secondary | ICD-10-CM | POA: Insufficient documentation

## 2014-07-04 DIAGNOSIS — R058 Other specified cough: Secondary | ICD-10-CM

## 2014-07-04 LAB — PULMONARY FUNCTION TEST
FEF 25-75 PRE: 1.82 L/s
FEF 25-75 Post: 1.18 L/sec
FEF2575-%CHANGE-POST: -34 %
FEF2575-%PRED-POST: 41 %
FEF2575-%PRED-PRE: 64 %
FEV1-%Change-Post: -8 %
FEV1-%PRED-PRE: 82 %
FEV1-%Pred-Post: 75 %
FEV1-Post: 2.1 L
FEV1-Pre: 2.29 L
FEV1FVC-%Change-Post: 0 %
FEV1FVC-%PRED-PRE: 91 %
FEV6-%CHANGE-POST: -8 %
FEV6-%PRED-POST: 82 %
FEV6-%Pred-Pre: 90 %
FEV6-Post: 2.81 L
FEV6-Pre: 3.09 L
FEV6FVC-%Change-Post: 0 %
FEV6FVC-%Pred-Post: 102 %
FEV6FVC-%Pred-Pre: 101 %
FVC-%Change-Post: -8 %
FVC-%PRED-POST: 80 %
FVC-%Pred-Pre: 88 %
FVC-POST: 2.81 L
FVC-Pre: 3.09 L
Post FEV1/FVC ratio: 75 %
Post FEV6/FVC ratio: 100 %
Pre FEV1/FVC ratio: 74 %
Pre FEV6/FVC Ratio: 100 %

## 2014-07-04 MED ORDER — METHACHOLINE 0.25 MG/ML NEB SOLN
2.0000 mL | Freq: Once | RESPIRATORY_TRACT | Status: AC
  Administered 2014-07-04: 0.5 mg via RESPIRATORY_TRACT

## 2014-07-04 MED ORDER — ALBUTEROL SULFATE (2.5 MG/3ML) 0.083% IN NEBU
2.5000 mg | INHALATION_SOLUTION | Freq: Once | RESPIRATORY_TRACT | Status: AC
  Administered 2014-07-04: 2.5 mg via RESPIRATORY_TRACT

## 2014-07-04 MED ORDER — METHACHOLINE 0.0625 MG/ML NEB SOLN
2.0000 mL | Freq: Once | RESPIRATORY_TRACT | Status: AC
  Administered 2014-07-04: 0.125 mg via RESPIRATORY_TRACT

## 2014-07-04 MED ORDER — METHACHOLINE 4 MG/ML NEB SOLN
2.0000 mL | Freq: Once | RESPIRATORY_TRACT | Status: AC
  Administered 2014-07-04: 8 mg via RESPIRATORY_TRACT

## 2014-07-04 MED ORDER — METHACHOLINE 1 MG/ML NEB SOLN
2.0000 mL | Freq: Once | RESPIRATORY_TRACT | Status: AC
  Administered 2014-07-04: 2 mg via RESPIRATORY_TRACT

## 2014-07-04 MED ORDER — METHACHOLINE 16 MG/ML NEB SOLN: 2.0000 mL | Freq: Once | RESPIRATORY_TRACT | Status: DC

## 2014-07-04 MED ORDER — SODIUM CHLORIDE 0.9 % IN NEBU
3.0000 mL | INHALATION_SOLUTION | Freq: Once | RESPIRATORY_TRACT | Status: AC
  Administered 2014-07-04: 3 mL via RESPIRATORY_TRACT

## 2014-07-08 ENCOUNTER — Encounter: Payer: Self-pay | Admitting: Internal Medicine

## 2014-07-08 ENCOUNTER — Other Ambulatory Visit: Payer: Self-pay | Admitting: Internal Medicine

## 2014-07-08 DIAGNOSIS — J45991 Cough variant asthma: Secondary | ICD-10-CM | POA: Insufficient documentation

## 2014-07-08 MED ORDER — MONTELUKAST SODIUM 10 MG PO TABS: 10.0000 mg | ORAL_TABLET | Freq: Every day | ORAL | Status: DC

## 2014-07-08 NOTE — Progress Notes (Signed)
Quick Note:  Spoke with pt and notified of results per Dr. Wert. Pt verbalized understanding and denied any questions.  ______ 

## 2014-07-12 ENCOUNTER — Telehealth: Payer: Self-pay | Admitting: Internal Medicine

## 2014-07-12 NOTE — Telephone Encounter (Signed)
RX was sent in on 07/08/14 for singulair.  Called [facility] and was made aware RX was received.  Called pt and made aware. Nothing further needed.

## 2014-07-16 ENCOUNTER — Ambulatory Visit (INDEPENDENT_AMBULATORY_CARE_PROVIDER_SITE_OTHER): Payer: 59 | Admitting: Internal Medicine

## 2014-07-16 ENCOUNTER — Encounter: Payer: Self-pay | Admitting: Internal Medicine

## 2014-07-16 VITALS — BP 106/60 | HR 77 | Ht 63.0 in | Wt 117.8 lb

## 2014-07-16 DIAGNOSIS — J45991 Cough variant asthma: Secondary | ICD-10-CM

## 2014-07-16 DIAGNOSIS — R058 Other specified cough: Secondary | ICD-10-CM

## 2014-07-16 DIAGNOSIS — R05 Cough: Secondary | ICD-10-CM

## 2014-07-16 MED ORDER — BECLOMETHASONE DIPROPIONATE 80 MCG/ACT IN AERS: 2.0000 | INHALATION_SPRAY | Freq: Two times a day (BID) | RESPIRATORY_TRACT | Status: DC

## 2014-07-16 NOTE — Patient Instructions (Signed)
Please see patient coordinator before you leave today  to schedule sinus CT    Continue singulair 10 mg each pm  After one week add qvar 80 Take 2 puffs first thing in am and then another 2 puffs about 12 hours later.   Don't make any other changes   Follow up after Aug 16 2014

## 2014-07-16 NOTE — Progress Notes (Signed)
Subjective:   Patient ID: Deborah Ortega, female    DOB: Jun 17, 1966  MRN: [id number]    Brief patient profile:  48 yowf never smoker with onset  Early 2000's of sneezing and itching spring = fall not better on clariton and zyrtec never allergy eval then cough x around 2010 which is about the same time  she underwent craniotomy posteriorly for meningioma referred 12/26/2013 by Deborah Ortega to pulmonary clinic.   History of Present Illness  12/26/2013 1st [facility] office visit/ Deborah Ortega  Chief Complaint  Patient presents with  . Pulmonary Consult    Referred per Deborah Ortega. Pt c/o cough x 4-5 years, seems to be gradually getting worse. Cough is triggered by eating and talking. Cough is non prod, and sometimes coughs until the point of vomiting.   sister has exact same pattern, mother has bad gerd - pt no better previously on PPI but chewing mint gum, cough worse with chocolate rec Pantoprazole (protonix) 40 mg   Take 30-60 min before first meal of the day and Pepcid 20 mg one bedtime and chloretrimeton 4 mg take 2 at bedtime  until return to office - this is the best way to tell whether stomach acid is contributing to your problem.   Prednisone 10 mg take  4 each am x 2 days,   2 each am x 2 days,  1 each am x 2 days and stop Take delsym two tsp every 12 hours and supplement if needed with  tramadol 50 mg up to 2 every 4 hours to suppress the urge to cough. Swallowing water or using ice chips/non mint and menthol containing candies (such as lifesavers or sugarless jolly ranchers) are also effective.  You should rest your voice and avoid activities that you know make you cough. Once you have eliminated the cough for 3 straight days try reducing the tramadol first   01/23/2014 f/u ov/Deborah Ortega re: cough x 4 y  Chief Complaint  Patient presents with  . Follow-up    Pt states cough has improved slightly since last visit.  She feels it was better while on prednisone. No new co's today.    never took tramadol at all / noct cough is gone / never able to eliminate daytime urge to clear throat and cough   [name]  Do you awaken from a sound sleep coughing violently?                            With trouble breathing? Resolved    Do you have choking episodes when you cannot  Get enough air, gasping for air ?              Yes, still daytime   Do you usually cough when you lie down into  The bed, or when you just lie down to rest ?                          better   Do you usually cough after meals or eating?         Yes, still    Do you cough when (or after) you bend over?    no   GERD SCORE     Kouffman Reflux v Neurogenic Cough Differentiator Neurogenic   Do you more-or-less cough all day long? Spells    Does change of temperature make  you cough? Hot worse   Does laughing or chuckling cause you to cough? sometimes   Do fumes (perfume, automobile fumes, burned  Toast, etc.,) cause you to cough ?      yes   Does speaking, singing, or talking on the phone cause you to cough   ?               yes   Neurogenic/Airway score      rec First take delsym two tsp every 12 hours and supplement if needed with  tramadol 50 mg up to 2 every 4 hours  .   Gabapentin 100 mg three times a day until return  Prednisone 10 mg take  4 each am x 2 days,   2 each am x 2 days,  1 each am x 2 days and stop (this is to eliminate allergies and inflammation from coughing) Protonix (pantoprazole) Take 30-60 min before first meal of the day and Pepcid 20 mg one bedtime plus chlorpheniramine 4 mg x 1 at bedtime (both available over the counter)  until cough is completely gone for at least a week without the need for cough suppression GERD diet    03/05/2014 f/u ov/Deborah Ortega re: cough x 5 years Chief Complaint  Patient presents with  . Follow-up    Pt states that her cough has improved, but not resolved. No new co's today.   not able to take tramadol  and neurontin together but tessilon twice daily still having cough to point vomiting Cough more day than night  rec tessilon 200 four times day until no coughing at all x one week  Increase neurontin to four times (bfast lunch supper and bedtime ) Change the night time routine to take pepcid 20 mg and 2 x chloretrimeton one hour before bedtime  Please schedule a follow up office visit in 4 weeks, sooner if needed    03/25/2014 f/u ov/Deborah Ortega re: cough x 5 years  Chief Complaint  Patient presents with  . Acute Visit    Pt states that her cough is still not improving. She had time off of work and so tried taking the tramadol- she took 1/2 tablet and went a whole day w/o cough. She states that the next day she tried taking med again and this caused vomiting. Now she is taking 1/4 tablet and no more vomiting, but still has the cough.   not coughing at all HS using one chloretrimeton and pepcd at bedtime but can't use chortrimeton daytime ? 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  rec If the cough is not completely gone reasonable to try phenergan with tramadol every 4h but take  Prednisone 10 mg take  4 each am x 2 days,   2 each am x 2 days,  1 each am x 2 days and stop    05/08/2014 f/u ov/Deborah Ortega re:  Chief Complaint  Patient presents with  . Follow-up    Pt states that her cough is worse since she had developed a cold a wk ago. She has been zithromax and this helped somewhat.      No obvious  other patterns in day to day or daytime variabilty or assoc sob  or cp or chest tightness, subjective wheeze overt sinus or hb symptoms. No unusual exp hx or h/o childhood pna/ asthma or knowledge of premature birth.  Sleeping ok without nocturnal  or early am exacerbation  of respiratory  c/o's or need for noct saba. Also denies any obvious fluctuation of symptoms with weather or environmental changes or other aggravating or alleviating factors except as  outlined above   Current Medications, Allergies, Complete Past Medical History, Past Surgical History, Family History, and Social History were reviewed in [employer] record.  ROS  The following are not active complaints unless bolded sore throat, dysphagia, dental problems, itching, sneezing,  nasal congestion or excess/ purulent secretions, ear ache,   fever, chills, sweats, unintended wt loss, pleuritic or exertional cp, hemoptysis,  orthopnea pnd or leg swelling, presyncope, palpitations, heartburn, abdominal pain, anorexia, nausea, vomiting, diarrhea  or change in bowel or urinary habits, change in stools or urine, dysuria,hematuria,  rash, arthralgias, visual complaints, headache, numbness weakness or ataxia or problems with walking or coordination,  change in mood/affect or memory.                   Objective:   Physical Exam  amb wf nad    01/23/14            115 >  03/05/2014   117 > 03/25/2014  120 > 05/08/2014  Wt Readings from Last 3 Encounters:  12/26/13 115 lb (52.164 kg)  11/19/13 113 lb (51.256 kg)  11/19/13 113 lb (51.256 kg)      HEENT: nl dentition, turbinates, and oropharynx with min cobblestoning, no excess pnds   Nl external ear canals without cough reflex   NECK :  without JVD/Nodes/TM/ nl carotid upstrokes bilaterally   LUNGS: no acc muscle use, clear to A and P bilaterally with cough on insp   CV:  RRR  no s3 or murmur or increase in P2, no edema   ABD:  soft and nontender with nl excursion in the supine position. No bruits or organomegaly, bowel sounds nl  MS:  warm without deformities, calf tenderness, cyanosis or clubbing      cxr 08/14/13 The heart size and mediastinal contours are within normal limits.  Both lungs are clear. The visualized skeletal structures are  unremarkable.            Assessment & Plan:    Subjective:   Patient ID: Deborah Ortega, female    DOB: Jul 03, 1966  MRN: [id number]    Brief  patient profile:  57 yowf never smoker with onset  Early 2000's of sneezing and itching spring = fall not better on clariton and zyrtec never allergy eval then cough x around 2010 which is about the same time  she underwent craniotomy posteriorly for meningioma referred 12/26/2013 by Deborah Ortega to pulmonary clinic.   History of Present Illness  12/26/2013 1st [facility] office visit/ Jaimy Kliethermes  Chief Complaint  Patient presents with  . Pulmonary Consult    Referred per Deborah Ortega. Pt c/o cough x 4-5 years, seems to be gradually getting worse. Cough is triggered by eating and talking. Cough is non prod, and sometimes coughs until the point of vomiting.   sister has exact same pattern, mother has bad gerd - pt no better previously on PPI but chewing mint gum, cough worse with chocolate rec Pantoprazole (protonix) 40 mg   Take 30-60 min before first meal of the day and Pepcid 20 mg one bedtime and chloretrimeton 4 mg take 2 at bedtime  until return to office - this is the best way to tell whether stomach acid is contributing to your problem.   Prednisone 10 mg take  4 each am  x 2 days,   2 each am x 2 days,  1 each am x 2 days and stop Take delsym two tsp every 12 hours and supplement if needed with  tramadol 50 mg up to 2 every 4 hours to suppress the urge to cough. Swallowing water or using ice chips/non mint and menthol containing candies (such as lifesavers or sugarless jolly ranchers) are also effective.  You should rest your voice and avoid activities that you know make you cough. Once you have eliminated the cough for 3 straight days try reducing the tramadol first   01/23/2014 f/u ov/Aleiyah Halpin re: cough x 4 y  Chief Complaint  Patient presents with  . Follow-up    Pt states cough has improved slightly since last visit.  She feels it was better while on prednisone. No new co's today.   never took tramadol at all / noct cough is gone / never able to eliminate daytime urge to clear  throat and cough   [name]  Do you awaken from a sound sleep coughing violently?                            With trouble breathing? Resolved    Do you have choking episodes when you cannot  Get enough air, gasping for air ?              Yes, still daytime   Do you usually cough when you lie down into  The bed, or when you just lie down to rest ?                          better   Do you usually cough after meals or eating?         Yes, still    Do you cough when (or after) you bend over?    no   GERD SCORE     Kouffman Reflux v Neurogenic Cough Differentiator Neurogenic   Do you more-or-less cough all day long? Spells    Does change of temperature make you cough? Hot worse   Does laughing or chuckling cause you to cough? sometimes   Do fumes (perfume, automobile fumes, burned  Toast, etc.,) cause you to cough ?      yes   Does speaking, singing, or talking on the phone cause you to cough   ?               yes   Neurogenic/Airway score      rec First take delsym two tsp every 12 hours and supplement if needed with  tramadol 50 mg up to 2 every 4 hours  .   Gabapentin 100 mg three times a day until return  Prednisone 10 mg take  4 each am x 2 days,   2 each am x 2 days,  1 each am x 2 days and stop (this is to eliminate allergies and inflammation from coughing) Protonix (pantoprazole) Take 30-60 min before first meal of the day and Pepcid 20 mg one bedtime plus chlorpheniramine 4 mg x 1 at bedtime (both available over the counter)  until cough is completely gone for at least a week without the need for cough suppression GERD diet    03/05/2014 f/u ov/Jamison Yuhasz re: cough x 5 years Chief Complaint  Patient presents with  . Follow-up    Pt states  that her cough has improved, but not resolved. No new co's today.   not able to take tramadol and neurontin together but tessilon twice daily still having cough to point vomiting Cough more day  than night  rec tessilon 200 four times day until no coughing at all x one week  Increase neurontin to four times (bfast lunch supper and bedtime ) Change the night time routine to take pepcid 20 mg and 2 x chloretrimeton one hour before bedtime  Please schedule a follow up office visit in 4 weeks, sooner if needed    03/25/2014 f/u ov/Rakim Moone re: cough x 5 years  Chief Complaint  Patient presents with  . Acute Visit    Pt states that her cough is still not improving. She had time off of work and so tried taking the tramadol- she took 1/2 tablet and went a whole day w/o cough. She states that the next day she tried taking med again and this caused vomiting. Now she is taking 1/4 tablet and no more vomiting, but still has the cough.   not coughing at all HS using one chloretrimeton and pepcd at bedtime but can't use chortrimeton daytime ? 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  rec Prednisone 10 mg take  4 each am x 2 days,   2 each am x 2 days,  1 each am x 2 days and stop and use tramadol / phenergan prn (didn't need it)    05/08/2014 f/u ov/Tsuyako Jolley re: cough x 5 years  Chief Complaint  Patient presents with  . Follow-up    Pt states that her cough is worse since she had developed a cold a wk ago. She has been zithromax and this helped somewhat.   was better  But never able to stop tessilon and then acutely ill p exp to sick son x one week prior to OV   Not clear that she's using neurontin consistently but does seem to help and does tolerate the 100 mg strength. rec rec Continue tessilon 200 mg up to every 4 hours / supplement with tramadol /phenergan if needed as needed for nausea  Prednisone 10 mg take  4 each am x 2 days,   2 each am x 2 days,  1 each am x 2 days and stop   06/19/2014 f/u ov/Leston Schueller re: cough x 5 y "don't remember when I wasn't coughing  Chief Complaint  Patient presents with  . Follow-up    Pt states that her cough is  unchanged. Unable to take tramadol due to her work and home schedule.   never took prednisone or tramadol in suppressive doses but with pepcid and chloretrimeton x 4 mg sleeps fine  Cough mostly provoked by voice use Not able to tolerate increased doses of neurontin s feeling groggy  rec   schedule methacholine challenge> pos for airflow obst/ neg for cough > start singulair     07/16/2014 f/u ov/Chaylee Ehrsam re: cough x 5 y on singulair x one week no change yet  Chief Complaint  Patient presents with  . Follow-up    Pt states that since last visit had to have surgery to remove kidney stones.  She feels like her cough is about the same.    cough is worse while swallowing esp crackers/ crumbs / remains dry  Not noct at all    No obvious  other patterns in day to day or daytime variabilty  or assoc sob (unless coughing)  or cp or chest tightness, subjective wheeze overt sinus or hb symptoms. No unusual exp hx or h/o childhood pna/ asthma or knowledge of premature birth.  Sleeping ok without nocturnal  or early am exacerbation  of respiratory  c/o's or need for noct saba. Also denies any obvious fluctuation of symptoms with weather or environmental changes or other aggravating or alleviating factors except as outlined above   Current Medications, Allergies, Complete Past Medical History, Past Surgical History, Family History, and Social History were reviewed in [employer] record.  ROS  The following are not active complaints unless bolded sore throat, dysphagia, dental problems, itching, sneezing,  nasal congestion or excess/ purulent secretions, ear ache,   fever, chills, sweats, unintended wt loss, pleuritic or exertional cp, hemoptysis,  orthopnea pnd or leg swelling, presyncope, palpitations, heartburn, abdominal pain, anorexia, nausea, vomiting, diarrhea  or change in bowel or urinary habits, change in stools or urine, dysuria,hematuria,  rash, arthralgias, visual complaints,  headache, numbness weakness or ataxia or problems with walking or coordination,  change in mood/affect or memory.                   Objective:   Physical Exam  amb wf nad occ throat clearing, no cough during interview or exam ? Belle affect ?   01/23/14             115 >  03/05/2014   117 > 03/25/2014  120 >  05/08/2014  120> 06/19/2014 119 >  07/16/2014  118  Wt Readings from Last 3 Encounters:  12/26/13 115 lb (52.164 kg)  11/19/13 113 lb (51.256 kg)  11/19/13 113 lb (51.256 kg)      HEENT: nl dentition, turbinates, and oropharynx with min cobblestoning, no excess pnds   Nl external ear canals without cough reflex   NECK :  without JVD/Nodes/TM/ nl carotid upstrokes bilaterally   LUNGS: no acc muscle use, clear to A and P bilaterally with no cough on insp   CV:  RRR  no s3 or murmur or increase in P2, no edema   ABD:  soft and nontender with nl excursion in the supine position. No bruits or organomegaly, bowel sounds nl  MS:  warm without deformities, calf tenderness, cyanosis or clubbing      CXR  06/19/2014 :  No acute changes            Assessment & Plan:

## 2014-07-17 NOTE — Assessment & Plan Note (Signed)
-   methacholine challenge   11/191/5 > pos for asthma/ no cough just chest tightness > trial of singulair started 07/08/14  - 07/16/2014 p extensive coaching HFA effectiveness =    75% so try adding qvar 80 2bid on 07/24/14    She didn't cough from mct so she may have occult asthma but no relation to cough > try two full weeks of singulair, then add 2 weeks overlap with qvar 80 2bid then 2 weeks with just the qvar to see what benefit if any and if not stop all asthma rx   In meantime, ? Acid (or non-acid) GERD > always difficult to exclude as up to 75% of pts in some series report no assoc GI/ Heartburn symptoms> rec continue max (24h)  acid suppression and diet restrictions/ reviewed       Each maintenance medication was reviewed in detail including most importantly the difference between maintenance and as needed and under what circumstances the prns are to be used.  Please see instructions for details which were reviewed in writing and the patient given a copy.

## 2014-07-17 NOTE — Assessment & Plan Note (Signed)
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

## 2014-07-19 ENCOUNTER — Ambulatory Visit (INDEPENDENT_AMBULATORY_CARE_PROVIDER_SITE_OTHER)
Admission: RE | Admit: 2014-07-19 | Discharge: 2014-07-19 | Disposition: A | Discharge status: Home / Self Care | Payer: 59 | Source: Ambulatory Visit | Attending: Internal Medicine | Admitting: Internal Medicine

## 2014-07-19 DIAGNOSIS — R058 Other specified cough: Secondary | ICD-10-CM

## 2014-07-19 DIAGNOSIS — R05 Cough: Secondary | ICD-10-CM

## 2014-07-22 NOTE — Progress Notes (Signed)
Quick Note:  Spoke with pt and notified of results per Dr. Wert. Pt verbalized understanding and denied any questions.  ______ 

## 2014-07-26 ENCOUNTER — Encounter: Payer: Self-pay | Admitting: Internal Medicine

## 2014-07-26 LAB — UA PROTEIN, DIPSTICK - CHCC
BLOOD UA: POSITIVE — AB
Ketones, UA: NEGATIVE
Specific Gravity, UA: 1.025 (ref 1.005–1.030)
Urobilinogen, UA: 8 — AB
pH, UA: 5.5 (ref 4.5–8.0)

## 2014-07-29 ENCOUNTER — Other Ambulatory Visit (INDEPENDENT_AMBULATORY_CARE_PROVIDER_SITE_OTHER): Payer: 59

## 2014-07-29 ENCOUNTER — Telehealth: Payer: Self-pay | Admitting: Internal Medicine

## 2014-07-29 ENCOUNTER — Other Ambulatory Visit: Payer: Self-pay | Admitting: Internal Medicine

## 2014-07-29 ENCOUNTER — Other Ambulatory Visit: Payer: Self-pay

## 2014-07-29 DIAGNOSIS — R300 Dysuria: Secondary | ICD-10-CM

## 2014-07-29 DIAGNOSIS — Z1231 Encounter for screening mammogram for malignant neoplasm of breast: Secondary | ICD-10-CM

## 2014-07-29 NOTE — Telephone Encounter (Signed)
Pt called in said that someone called her brom Dr Asa Lente office and was concerned about a UA that she did with Dr wert.  She dont understand and would like a return call    Best number [phone]

## 2014-07-30 ENCOUNTER — Other Ambulatory Visit: Payer: 59

## 2014-07-30 DIAGNOSIS — R300 Dysuria: Secondary | ICD-10-CM

## 2014-07-30 LAB — URINALYSIS, ROUTINE W REFLEX MICROSCOPIC
Bilirubin Urine: NEGATIVE
KETONES UR: NEGATIVE
LEUKOCYTES UA: NEGATIVE
Nitrite: POSITIVE — AB
Total Protein, Urine: NEGATIVE
UROBILINOGEN UA: 0.2 (ref 0.0–1.0)
Urine Glucose: NEGATIVE
WBC, UA: NONE SEEN (ref 0–?)
pH: 6.5 (ref 5.0–8.0)

## 2014-07-30 NOTE — Telephone Encounter (Signed)
LVM for pt to call back.

## 2014-07-30 NOTE — Telephone Encounter (Signed)
Pt called and wanted to know the results to the retest (UA).   Labs have not been resulted.

## 2014-07-31 NOTE — Telephone Encounter (Signed)
LVM for pt to call back.   RE: need to know the abx with dose and sig that [facility] had pt on the end of November/beginning of December per urethral stent removal

## 2014-07-31 NOTE — Telephone Encounter (Signed)
Called [facility] [phone]) to see if there were any significant abnormal growth. Lab tech indicated that there was nothing yet.

## 2014-07-31 NOTE — Telephone Encounter (Signed)
[facility] - Triage indicated that MD usually will rx cipro but can not say for sure, nothing stated in the OV notes.   Would like to gave the culture results when we get them. Fax # [phone]  Will have Dr. Jeffie Pollock review, he may want to treat it.  Phone: [phone]

## 2014-08-01 NOTE — Telephone Encounter (Signed)
[facility] called and confirmed with Dr. Jeffie Pollock that no abx was given after the stent removal in November.

## 2014-08-02 LAB — URINE CULTURE

## 2014-08-03 MED ORDER — SULFAMETHOXAZOLE-TRIMETHOPRIM 800-160 MG PO TABS: 1.0000 | ORAL_TABLET | Freq: Two times a day (BID) | ORAL | Status: DC

## 2014-08-03 NOTE — Telephone Encounter (Signed)
Urine culture result came back.  Spoke to pt. Pt stated that she not allergic to any Sulfa abx that she knows of.   Requested the advise of Dr. Nicki Reaper and was advised to rx Bactrim.   Pt is aware rx was sent and to call us on Monday if there are any issues that we need to know about.

## 2014-08-04 NOTE — Telephone Encounter (Signed)
FYI.  I reviewed her urine culture.  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  Pt unable to take levaquin.  I discussed this with the patient.   Explained culture results.  She has recently had a stent removed.  She thinks she has taken a sulfa medication in the past (but not sure).  Prescribed bactrim DS bid.  Pt aware.  Questions answered.  Pt will call if any problems or other questions.

## 2014-08-05 NOTE — Telephone Encounter (Signed)
Noted and agree. Thanks.

## 2014-08-20 ENCOUNTER — Ambulatory Visit (INDEPENDENT_AMBULATORY_CARE_PROVIDER_SITE_OTHER): Payer: 59 | Admitting: Internal Medicine

## 2014-08-20 ENCOUNTER — Encounter: Payer: Self-pay | Admitting: Internal Medicine

## 2014-08-20 VITALS — BP 110/70 | HR 80 | Ht 63.0 in | Wt 116.0 lb

## 2014-08-20 DIAGNOSIS — J45991 Cough variant asthma: Secondary | ICD-10-CM

## 2014-08-20 DIAGNOSIS — R05 Cough: Secondary | ICD-10-CM

## 2014-08-20 DIAGNOSIS — R058 Other specified cough: Secondary | ICD-10-CM

## 2014-08-20 MED ORDER — BECLOMETHASONE DIPROPIONATE 40 MCG/ACT IN AERS: INHALATION_SPRAY | RESPIRATORY_TRACT | Status: DC

## 2014-08-20 MED ORDER — PREDNISONE 10 MG PO TABS: ORAL_TABLET | ORAL | Status: DC

## 2014-08-20 MED ORDER — GABAPENTIN 300 MG PO CAPS: 300.0000 mg | ORAL_CAPSULE | Freq: Three times a day (TID) | ORAL | Status: DC

## 2014-08-20 MED ORDER — HYDROCODONE-ACETAMINOPHEN 5-325 MG PO TABS: ORAL_TABLET | ORAL | Status: DC

## 2014-08-20 NOTE — Progress Notes (Signed)
Subjective:   Patient ID: Deborah Ortega, female    DOB: Jun 17, 1966  MRN: [id number]    Brief patient profile:  48 yowf never smoker with onset  Early 2000's of sneezing and itching spring = fall not better on clariton and zyrtec never allergy eval then cough x around 2010 which is about the same time  she underwent craniotomy posteriorly for meningioma referred 12/26/2013 by Deborah Ortega to pulmonary clinic.   History of Present Illness  12/26/2013 1st [facility] office visit/ Deborah Ortega  Chief Complaint  Patient presents with  . Pulmonary Consult    Referred per Deborah Ortega. Pt c/o cough x 4-5 years, seems to be gradually getting worse. Cough is triggered by eating and talking. Cough is non prod, and sometimes coughs until the point of vomiting.   sister has exact same pattern, mother has bad gerd - pt no better previously on PPI but chewing mint gum, cough worse with chocolate rec Pantoprazole (protonix) 40 mg   Take 30-60 min before first meal of the day and Pepcid 20 mg one bedtime and chloretrimeton 4 mg take 2 at bedtime  until return to office - this is the best way to tell whether stomach acid is contributing to your problem.   Prednisone 10 mg take  4 each am x 2 days,   2 each am x 2 days,  1 each am x 2 days and stop Take delsym two tsp every 12 hours and supplement if needed with  tramadol 50 mg up to 2 every 4 hours to suppress the urge to cough. Swallowing water or using ice chips/non mint and menthol containing candies (such as lifesavers or sugarless jolly ranchers) are also effective.  You should rest your voice and avoid activities that you know make you cough. Once you have eliminated the cough for 3 straight days try reducing the tramadol first   01/23/2014 f/u ov/Deborah Ortega re: cough x 4 y  Chief Complaint  Patient presents with  . Follow-up    Pt states cough has improved slightly since last visit.  She feels it was better while on prednisone. No new co's today.    never took tramadol at all / noct cough is gone / never able to eliminate daytime urge to clear throat and cough   Deborah Ortega Reflux v Neurogenic Cough Differentiator Reflux Comments  Do you awaken from a sound sleep coughing violently?                            With trouble breathing? Resolved    Do you have choking episodes when you cannot  Get enough air, gasping for air ?              Yes, still daytime   Do you usually cough when you lie down into  The bed, or when you just lie down to rest ?                          better   Do you usually cough after meals or eating?         Yes, still    Do you cough when (or after) you bend over?    no   GERD SCORE     Deborah Ortega Reflux v Neurogenic Cough Differentiator Neurogenic   Do you more-or-less cough all day long? Spells    Does change of temperature make  you cough? Hot worse   Does laughing or chuckling cause you to cough? sometimes   Do fumes (perfume, automobile fumes, burned  Toast, etc.,) cause you to cough ?      yes   Does speaking, singing, or talking on the phone cause you to cough   ?               yes   Neurogenic/Airway score      rec First take delsym two tsp every 12 hours and supplement if needed with  tramadol 50 mg up to 2 every 4 hours  .   Gabapentin 100 mg three times a day until return  Prednisone 10 mg take  4 each am x 2 days,   2 each am x 2 days,  1 each am x 2 days and stop (this is to eliminate allergies and inflammation from coughing) Protonix (pantoprazole) Take 30-60 min before first meal of the day and Pepcid 20 mg one bedtime plus chlorpheniramine 4 mg x 1 at bedtime (both available over the counter)  until cough is completely gone for at least a week without the need for cough suppression GERD diet    03/05/2014 f/u ov/Deborah Ortega re: cough x 5 years Chief Complaint  Patient presents with  . Follow-up    Pt states that her cough has improved, but not resolved. No new co's today.   not able to take tramadol  and neurontin together but tessilon twice daily still having cough to point vomiting Cough more day than night  rec tessilon 200 four times day until no coughing at all x one week  Increase neurontin to four times (bfast lunch supper and bedtime ) Change the night time routine to take pepcid 20 mg and 2 x chloretrimeton one hour before bedtime  Please schedule a follow up office visit in 4 weeks, sooner if needed    03/25/2014 f/u ov/Deborah Ortega re: cough x 5 years  Chief Complaint  Patient presents with  . Acute Visit    Pt states that her cough is still not improving. She had time off of work and so tried taking the tramadol- she took 1/2 tablet and went a whole day w/o cough. She states that the next day she tried taking med again and this caused vomiting. Now she is taking 1/4 tablet and no more vomiting, but still has the cough.   not coughing at all HS using one chloretrimeton and pepcd at bedtime but can't use chortrimeton daytime ? 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  rec If the cough is not completely gone reasonable to try phenergan with tramadol every 4h but take  Prednisone 10 mg take  4 each am x 2 days,   2 each am x 2 days,  1 each am x 2 days and stop    05/08/2014 f/u ov/Deborah Ortega re:  Chief Complaint  Patient presents with  . Follow-up    Pt states that her cough is worse since she had developed a cold a wk ago. She has been zithromax and this helped somewhat.      No obvious  other patterns in day to day or daytime variabilty or assoc sob  or cp or chest tightness, subjective wheeze overt sinus or hb symptoms. No unusual exp hx or h/o childhood pna/ asthma or knowledge of premature birth.  Sleeping ok without nocturnal  or early am exacerbation  of respiratory  c/o's or need for noct saba. Also denies any obvious fluctuation of symptoms with weather or environmental changes or other aggravating or alleviating factors except as  outlined above   Current Medications, Allergies, Complete Past Medical History, Past Surgical History, Family History, and Social History were reviewed in [employer] record.  ROS  The following are not active complaints unless bolded sore throat, dysphagia, dental problems, itching, sneezing,  nasal congestion or excess/ purulent secretions, ear ache,   fever, chills, sweats, unintended wt loss, pleuritic or exertional cp, hemoptysis,  orthopnea pnd or leg swelling, presyncope, palpitations, heartburn, abdominal pain, anorexia, nausea, vomiting, diarrhea  or change in bowel or urinary habits, change in stools or urine, dysuria,hematuria,  rash, arthralgias, visual complaints, headache, numbness weakness or ataxia or problems with walking or coordination,  change in mood/affect or memory.                   Objective:   Physical Exam  amb wf nad    01/23/14            115 >  03/05/2014   117 > 03/25/2014  120 > 05/08/2014  Wt Readings from Last 3 Encounters:  12/26/13 115 lb (52.164 kg)  11/19/13 113 lb (51.256 kg)  11/19/13 113 lb (51.256 kg)      HEENT: nl dentition, turbinates, and oropharynx with min cobblestoning, no excess pnds   Nl external ear canals without cough reflex   NECK :  without JVD/Nodes/TM/ nl carotid upstrokes bilaterally   LUNGS: no acc muscle use, clear to A and P bilaterally with cough on insp   CV:  RRR  no s3 or murmur or increase in P2, no edema   ABD:  soft and nontender with nl excursion in the supine position. No bruits or organomegaly, bowel sounds nl  MS:  warm without deformities, calf tenderness, cyanosis or clubbing      cxr 08/14/13 The heart size and mediastinal contours are within normal limits.  Both lungs are clear. The visualized skeletal structures are  unremarkable.            Assessment & Plan:    Subjective:   Patient ID: Deborah Ortega, female    DOB: Jul 03, 1966  MRN: [id number]    Brief  patient profile:  57 yowf never smoker with onset  Early 2000's of sneezing and itching spring = fall not better on clariton and zyrtec never allergy eval then cough x around 2010 which is about the same time  she underwent craniotomy posteriorly for meningioma referred 12/26/2013 by Deborah Ortega to pulmonary clinic.   History of Present Illness  12/26/2013 1st [facility] office visit/ Elham Fini  Chief Complaint  Patient presents with  . Pulmonary Consult    Referred per Deborah Ortega. Pt c/o cough x 4-5 years, seems to be gradually getting worse. Cough is triggered by eating and talking. Cough is non prod, and sometimes coughs until the point of vomiting.   sister has exact same pattern, mother has bad gerd - pt no better previously on PPI but chewing mint gum, cough worse with chocolate rec Pantoprazole (protonix) 40 mg   Take 30-60 min before first meal of the day and Pepcid 20 mg one bedtime and chloretrimeton 4 mg take 2 at bedtime  until return to office - this is the best way to tell whether stomach acid is contributing to your problem.   Prednisone 10 mg take  4 each am  x 2 days,   2 each am x 2 days,  1 each am x 2 days and stop Take delsym two tsp every 12 hours and supplement if needed with  tramadol 50 mg up to 2 every 4 hours to suppress the urge to cough. Swallowing water or using ice chips/non mint and menthol containing candies (such as lifesavers or sugarless jolly ranchers) are also effective.  You should rest your voice and avoid activities that you know make you cough. Once you have eliminated the cough for 3 straight days try reducing the tramadol first   01/23/2014 f/u ov/Peyson Delao re: cough x 4 y  Chief Complaint  Patient presents with  . Follow-up    Pt states cough has improved slightly since last visit.  She feels it was better while on prednisone. No new co's today.   never took tramadol at all / noct cough is gone / never able to eliminate daytime urge to clear  throat and cough   Deborah Ortega Reflux v Neurogenic Cough Differentiator Reflux Comments  Do you awaken from a sound sleep coughing violently?                            With trouble breathing? Resolved    Do you have choking episodes when you cannot  Get enough air, gasping for air ?              Yes, still daytime   Do you usually cough when you lie down into  The bed, or when you just lie down to rest ?                          better   Do you usually cough after meals or eating?         Yes, still    Do you cough when (or after) you bend over?    no   GERD SCORE     Deborah Ortega Reflux v Neurogenic Cough Differentiator Neurogenic   Do you more-or-less cough all day long? Spells    Does change of temperature make you cough? Hot worse   Does laughing or chuckling cause you to cough? sometimes   Do fumes (perfume, automobile fumes, burned  Toast, etc.,) cause you to cough ?      yes   Does speaking, singing, or talking on the phone cause you to cough   ?               yes   Neurogenic/Airway score      rec First take delsym two tsp every 12 hours and supplement if needed with  tramadol 50 mg up to 2 every 4 hours  .   Gabapentin 100 mg three times a day until return  Prednisone 10 mg take  4 each am x 2 days,   2 each am x 2 days,  1 each am x 2 days and stop (this is to eliminate allergies and inflammation from coughing) Protonix (pantoprazole) Take 30-60 min before first meal of the day and Pepcid 20 mg one bedtime plus chlorpheniramine 4 mg x 1 at bedtime (both available over the counter)  until cough is completely gone for at least a week without the need for cough suppression GERD diet    03/05/2014 f/u ov/Janyra Barillas re: cough x 5 years Chief Complaint  Patient presents with  . Follow-up    Pt states  that her cough has improved, but not resolved. No new co's today.   not able to take tramadol and neurontin together but tessilon twice daily still having cough to point vomiting Cough more day  than night  rec tessilon 200 four times day until no coughing at all x one week  Increase neurontin to four times (bfast lunch supper and bedtime ) Change the night time routine to take pepcid 20 mg and 2 x chloretrimeton one hour before bedtime  Please schedule a follow up office visit in 4 weeks, sooner if needed    03/25/2014 f/u ov/Isaia Hassell re: cough x 5 years  Chief Complaint  Patient presents with  . Acute Visit    Pt states that her cough is still not improving. She had time off of work and so tried taking the tramadol- she took 1/2 tablet and went a whole day w/o cough. She states that the next day she tried taking med again and this caused vomiting. Now she is taking 1/4 tablet and no more vomiting, but still has the cough.   not coughing at all HS using one chloretrimeton and pepcd at bedtime but can't use chortrimeton daytime ? 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  rec Prednisone 10 mg take  4 each am x 2 days,   2 each am x 2 days,  1 each am x 2 days and stop and use tramadol / phenergan prn (didn't need it)    05/08/2014 f/u ov/Trysta Showman re: cough x 5 years  Chief Complaint  Patient presents with  . Follow-up    Pt states that her cough is worse since she had developed a cold a wk ago. She has been zithromax and this helped somewhat.   was better  But never able to stop tessilon and then acutely ill p exp to sick son x one week prior to OV   Not clear that she's using neurontin consistently but does seem to help and does tolerate the 100 mg strength. rec rec Continue tessilon 200 mg up to every 4 hours / supplement with tramadol /phenergan if needed as needed for nausea  Prednisone 10 mg take  4 each am x 2 days,   2 each am x 2 days,  1 each am x 2 days and stop   06/19/2014 f/u ov/Abhishek Levesque re: cough x 5 y "don't remember when I wasn't coughing  Chief Complaint  Patient presents with  . Follow-up    Pt states that her cough is  unchanged. Unable to take tramadol due to her work and home schedule.   never took prednisone or tramadol in suppressive doses but with pepcid and chloretrimeton x 4 mg sleeps fine  Cough mostly provoked by voice use Not able to tolerate increased doses of neurontin s feeling groggy  rec   schedule methacholine challenge> pos for airflow obst/ neg for cough > start singulair     07/16/2014 f/u ov/Antawan Mchugh re: cough x 5 y on singulair x one week no change yet  Chief Complaint  Patient presents with  . Follow-up    Pt states that since last visit had to have surgery to remove kidney stones.  She feels like her cough is about the same.    cough is worse while swallowing esp crackers/ crumbs / remains dry  Not noct at all  rec  schedule sinus CT > neg  Continue singulair 10 mg each pm  After one week add qvar 80 Take 2 puffs first thing in am and then another 2 puffs about 12 hours later.  Don't make any other changes    08/20/2014 f/u ov/Alyvia Derk re: cough x 5 y maint on singulair and qvar (23 doses left and should be on 12)  Chief Complaint  Patient presents with  . Follow-up    Pt c/o increased cough sicne 08/08/14- waking her up at night and "seems more congested".  She states that she coughs every time she uses her inhaler.     By 12/7 no benefit from singulair so added qvar 80 2bid but not sure she inhaled it corectly and it made her cough immediately then" got sick "08/08/14 p exp to sick fm member then a lot worse cough with fever nasal congestion / sore throat ? Assoc with finishing a sulfa abx for kidney infection so didn't take abx and the fever resolved Cough also at night but also with voice use and when eating now dry.    No obvious  other patterns in day to day or daytime variabilty or assoc sob (unless coughing)  or cp or chest tightness, subjective wheeze overt sinus or hb symptoms. No unusual exp hx or h/o childhood pna/ asthma or knowledge of premature birth.  Sleeping ok without  nocturnal  or early am exacerbation  of respiratory  c/o's or need for noct saba. Also denies any obvious fluctuation of symptoms with weather or environmental changes or other aggravating or alleviating factors except as outlined above   Current Medications, Allergies, Complete Past Medical History, Past Surgical History, Family History, and Social History were reviewed in [employer] record.  ROS  The following are not active complaints unless bolded sore throat, dysphagia, dental problems, itching, sneezing,  nasal congestion or excess/ purulent secretions, ear ache,   fever, chills, sweats, unintended wt loss, pleuritic or exertional cp, hemoptysis,  orthopnea pnd or leg swelling, presyncope, palpitations, heartburn, abdominal pain, anorexia, nausea, vomiting, diarrhea  or change in bowel or urinary habits, change in stools or urine, dysuria,hematuria,  rash, arthralgias, visual complaints, headache, numbness weakness or ataxia or problems with walking or coordination,  change in mood/affect or memory.                   Objective:   Physical Exam  amb wf nad did not cough or clear her throat once during interview/ exam but still had a hopeless affect   01/23/14             115 >  03/05/2014   117 > 03/25/2014  120 >  05/08/2014  120> 06/19/2014 119 >  07/16/2014  118 >  08/20/2014  116 Wt Readings from Last 3 Encounters:  12/26/13 115 lb (52.164 kg)  11/19/13 113 lb (51.256 kg)  11/19/13 113 lb (51.256 kg)      HEENT: nl dentition, turbinates, and oropharynx with min cobblestoning, no excess pnds   Nl external ear canals without cough reflex   NECK :  without JVD/Nodes/TM/ nl carotid upstrokes bilaterally   LUNGS: no acc muscle use, clear to A and P bilaterally with no cough on insp   CV:  RRR  no s3 or murmur or increase in P2, no edema   ABD:  soft and nontender with nl excursion in the supine position. No bruits or organomegaly, bowel sounds nl  MS:  warm  without deformities, calf tenderness, cyanosis or clubbing  CXR  06/19/2014 :  No acute changes            Assessment & Plan:

## 2014-08-20 NOTE — Patient Instructions (Addendum)
Prednisone 10 mg take  4 each am x 2 days,   2 each am x 2 days,  1 each am x 2 days and stop   Change to Qvar 40 Take 2 puffs first thing in am and then another 2 puffs about 12 hours later.   Neurontin 300 mg at bedtime and 100 as much as you can up to three times daily with meals  If all else fails > norco one every 4 hours as needed   If not better in 2 weeks call for referral to [facility]

## 2014-08-21 NOTE — Assessment & Plan Note (Signed)
-    Cyclical cough regimen  12/26/2013  > not completed as instructed -  01/23/14  added neurontin 100 mg tid > increased to qid 03/06/2014  - 9/73/5329 repeat cyclical cough regimen with tramadol/phenergan> did not take pred with it  - Allergy profile 06/19/2014 >  Eos 5.5%   IgE 86  Min Pos RAST for dust and [place] grass  - methacholine challenge   11/191/5 > pos for asthma see cough variant asthma  - Sinus CT 07/19/2014 >  No evidence of sinusitis. - 08/20/14 changed neurontin to 300 qhs and 100 tid daytime   If not improved x 2 weeks options are add elavil at hs or refer to Dr Joya Gaskins at [facility]

## 2014-08-21 NOTE — Assessment & Plan Note (Addendum)
-   methacholine challenge   11/191/5 > pos for asthma/ no cough just chest tightness > trial of singulair started 07/08/14 > d/c 08/20/14 -   qvar 80 2bid on 07/24/14 > ? Cough worse > changed to 40 2  bid 08/21/2014  The proper method of use, as well as anticipated side effects, of a metered-dose inhaler are discussed and demonstrated to the patient. Improved effectiveness after extensive coaching during this visit to a level of approximately  75% > the lower strength should work better with less cough if there really is an asthmatic component active here but clearly the singulair not effective so d/c

## 2014-09-17 ENCOUNTER — Other Ambulatory Visit: Payer: Self-pay | Admitting: Internal Medicine

## 2014-09-17 MED ORDER — MONTELUKAST SODIUM 10 MG PO TABS: 10.0000 mg | ORAL_TABLET | Freq: Every day | ORAL | Status: DC

## 2014-11-01 ENCOUNTER — Ambulatory Visit
Admission: RE | Admit: 2014-11-01 | Discharge: 2014-11-01 | Disposition: A | Discharge status: Home / Self Care | Payer: 59 | Source: Ambulatory Visit

## 2014-11-01 DIAGNOSIS — Z1231 Encounter for screening mammogram for malignant neoplasm of breast: Secondary | ICD-10-CM

## 2014-11-18 DIAGNOSIS — D496 Neoplasm of unspecified behavior of brain: Secondary | ICD-10-CM | POA: Insufficient documentation

## 2014-11-26 ENCOUNTER — Other Ambulatory Visit: Payer: Self-pay | Admitting: Obstetrics and Gynecology

## 2014-11-27 LAB — CYTOLOGY - PAP

## 2014-12-13 ENCOUNTER — Ambulatory Visit (INDEPENDENT_AMBULATORY_CARE_PROVIDER_SITE_OTHER): Payer: 59 | Admitting: Internal Medicine

## 2014-12-13 ENCOUNTER — Encounter: Payer: Self-pay | Admitting: Internal Medicine

## 2014-12-13 ENCOUNTER — Other Ambulatory Visit (INDEPENDENT_AMBULATORY_CARE_PROVIDER_SITE_OTHER): Payer: 59

## 2014-12-13 VITALS — BP 102/64 | HR 80 | Temp 98.8°F | Resp 14 | Ht 63.0 in | Wt 117.4 lb

## 2014-12-13 DIAGNOSIS — Z0000 Encounter for general adult medical examination without abnormal findings: Secondary | ICD-10-CM

## 2014-12-13 DIAGNOSIS — E041 Nontoxic single thyroid nodule: Secondary | ICD-10-CM

## 2014-12-13 LAB — CBC
HEMATOCRIT: 36.7 % (ref 36.0–46.0)
HEMOGLOBIN: 11.9 g/dL — AB (ref 12.0–15.0)
MCHC: 32.4 g/dL (ref 30.0–36.0)
MCV: 72.9 fl — ABNORMAL LOW (ref 78.0–100.0)
Platelets: 321 10*3/uL (ref 150.0–400.0)
RBC: 5.03 Mil/uL (ref 3.87–5.11)
RDW: 21.5 % — AB (ref 11.5–15.5)
WBC: 7.3 10*3/uL (ref 4.0–10.5)

## 2014-12-13 LAB — COMPREHENSIVE METABOLIC PANEL
ALK PHOS: 50 U/L (ref 39–117)
ALT: 10 U/L (ref 0–35)
AST: 13 U/L (ref 0–37)
Albumin: 4.4 g/dL (ref 3.5–5.2)
BUN: 11 mg/dL (ref 6–23)
CO2: 27 meq/L (ref 19–32)
Calcium: 9.1 mg/dL (ref 8.4–10.5)
Chloride: 104 mEq/L (ref 96–112)
Creatinine, Ser: 0.6 mg/dL (ref 0.40–1.20)
GFR: 113.86 mL/min (ref >60.00)
Glucose, Bld: 84 mg/dL (ref 70–99)
POTASSIUM: 4 meq/L (ref 3.5–5.1)
Sodium: 136 mEq/L (ref 135–145)
TOTAL PROTEIN: 7 g/dL (ref 6.0–8.3)
Total Bilirubin: 0.3 mg/dL (ref 0.2–1.2)

## 2014-12-13 LAB — LIPID PANEL
Cholesterol: 199 mg/dL (ref 0–200)
HDL: 52.4 mg/dL (ref >39.00)
LDL Cholesterol: 127 mg/dL — ABNORMAL HIGH (ref 0–99)
NonHDL: 146.6
Total CHOL/HDL Ratio: 4
Triglycerides: 100 mg/dL (ref 0.0–149.0)
VLDL: 20 mg/dL (ref 0.0–40.0)

## 2014-12-13 LAB — FERRITIN: FERRITIN: 4.1 ng/mL — AB (ref 10.0–291.0)

## 2014-12-13 MED ORDER — DICLOFENAC SODIUM 1 % TD GEL: 2.0000 g | Freq: Four times a day (QID) | TRANSDERMAL | Status: DC

## 2014-12-13 NOTE — Patient Instructions (Addendum)
We will check on the blood and iron levels today as well as the other labs. If you want you can call the gynecologist's office to see if they think there is a good reason for the blood loss. If not, and the iron levels are still low we may have to send you to the GI doctor to make sure we are not missing something in the stomach or colon causing the low blood levels.   We have sent in voltaren which is the gel which has a medicine like ibuprofen in it that you can use on the back. We have also put some stretching exercises to start doing 3-4 times per week to strengthen the muscles in the back.   Back Exercises Back exercises help treat and prevent back injuries. The goal is to increase your strength in your belly (abdominal) and back muscles. These exercises can also help with flexibility. Start these exercises when told by your doctor. HOME CARE Back exercises include: Pelvic Tilt.  Lie on your back with your knees bent. Tilt your pelvis until the lower part of your back is against the floor. Hold this position 5 to 10 sec. Repeat this exercise 5 to 10 times. Knee to Chest.  Pull 1 knee up against your chest and hold for 20 to 30 seconds. Repeat this with the other knee. This may be done with the other leg straight or bent, whichever feels better. Then, pull both knees up against your chest. Sit-Ups or Curl-Ups.  Bend your knees 90 degrees. Start with tilting your pelvis, and do a partial, slow sit-up. Only lift your upper half 30 to 45 degrees off the floor. Take at least 2 to 3 seonds for each sit-up. Do not do sit-ups with your knees out straight. If partial sit-ups are difficult, simply do the above but with only tightening your belly (abdominal) muscles and holding it as told. Hip-Lift.  Lie on your back with your knees flexed 90 degrees. Push down with your feet and shoulders as you raise your hips 2 inches off the floor. Hold for 10 seconds, repeat 5 to 10 times. Back Arches.  Lie on  your stomach. Prop yourself up on bent elbows. Slowly press on your hands, causing an arch in your low back. Repeat 3 to 5 times. Shoulder-Lifts.  Lie face down with arms beside your body. Keep hips and belly pressed to floor as you slowly lift your head and shoulders off the floor. Do not overdo your exercises. Be careful in the beginning. Exercises may cause you some mild back discomfort. If the pain lasts for more than 15 minutes, stop the exercises until you see your doctor. Improvement with exercise for back problems is slow.  Document Released: 09/04/2010 Document Revised: 10/25/2011 Document Reviewed: 06/03/2011 [facility] Patient Information 2015 [place], [place]. This information is not intended to replace advice given to you by your health care provider. Make sure you discuss any questions you have with your health care provider.

## 2014-12-13 NOTE — Progress Notes (Signed)
Pre visit review using our clinic review tool, if applicable. No additional management support is needed unless otherwise documented below in the visit note. 

## 2014-12-15 DIAGNOSIS — Z0000 Encounter for general adult medical examination without abnormal findings: Secondary | ICD-10-CM | POA: Insufficient documentation

## 2014-12-15 NOTE — Assessment & Plan Note (Signed)
Checking labs and has follow up with endocrinology in June for repeat ultrasound.

## 2014-12-15 NOTE — Assessment & Plan Note (Signed)
Flu shot yearly, tetanus up to date, pap smear up to date. Gets mammograms yearly. Talked with her about sun safety with sunscreen and protective clothing.

## 2014-12-15 NOTE — Progress Notes (Signed)
   Subjective:    Patient ID: BRIGID VANDEKAMP, female    DOB: March 03, 1966, 49 y.o.   MRN: [id number]  HPI Patient is a 49 YO female who is here for wellness. Please see A/P for status and treatment of chronic medical problems. No new complaints.   PMH, [facility], social history reviewed with the patient.   Review of Systems  Constitutional: Negative.   HENT: Negative.   Respiratory: Negative.   Cardiovascular: Negative.   Gastrointestinal: Negative.   Musculoskeletal: Negative.   Skin: Negative.   Neurological: Negative.       Objective:   Physical Exam  Constitutional: She is oriented to person, place, and time. She appears well-developed and well-nourished.  HENT:  Head: Normocephalic and atraumatic.  Eyes: EOM are normal.  Neck: Normal range of motion.  Cardiovascular: Normal rate and regular rhythm.   Pulmonary/Chest: Effort normal and breath sounds normal.  Abdominal: Soft.  Musculoskeletal: She exhibits no edema.  Neurological: She is alert and oriented to person, place, and time.  Skin: Skin is warm and dry.   Filed Vitals:   12/13/14 1418  BP: 102/64  Pulse: 80  Temp: 98.8 F (37.1 C)  TempSrc: Oral  Resp: 14  Height: 5\' 3"  (1.6 m)  Weight: 117 lb 6.4 oz (53.252 kg)  SpO2: 98%      Assessment & Plan:

## 2014-12-27 ENCOUNTER — Telehealth: Payer: Self-pay | Admitting: Internal Medicine

## 2014-12-27 NOTE — Telephone Encounter (Signed)
I called and spoke with patient about her lab results.

## 2014-12-27 NOTE — Telephone Encounter (Signed)
Please call patient with lab results from 4/29 at [phone]

## 2014-12-30 ENCOUNTER — Other Ambulatory Visit: Payer: Self-pay | Admitting: Internal Medicine

## 2015-02-12 ENCOUNTER — Encounter: Payer: Self-pay | Admitting: Endocrinology

## 2015-02-12 ENCOUNTER — Ambulatory Visit (INDEPENDENT_AMBULATORY_CARE_PROVIDER_SITE_OTHER): Payer: 59 | Admitting: Endocrinology

## 2015-02-12 VITALS — BP 102/70 | HR 92 | Temp 98.3°F | Ht 63.0 in | Wt 119.0 lb

## 2015-02-12 DIAGNOSIS — E041 Nontoxic single thyroid nodule: Secondary | ICD-10-CM

## 2015-02-12 LAB — TSH: TSH: 0.89 u[IU]/mL (ref 0.35–4.50)

## 2015-02-12 NOTE — Patient Instructions (Addendum)
blood tests are requested for you today.  We'll let you know about the results. Let's recheck the ultrasound.  you will receive a phone call, about a day and time for an appointment most of the time, a "lumpy thyroid" will eventually become overactive.  this is usually a slow process, happening over the span of many years.   Please return in 2 years.

## 2015-02-12 NOTE — Progress Notes (Signed)
Subjective:    Patient ID: Deborah Ortega, female    DOB: 19-Feb-1966, 49 y.o.   MRN: [id number]  HPI Pt returns for f/u of left thyroid nodule (dx'ed 2003, when she had benign bx).  She does not notice the nodule.  pt states she feels well in general.  Past Medical History  Diagnosis Date  . ALLERGIC RHINITIS   . GERD   . 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 MIGRAINE HEADACHE   . RENAL CALCULUS, HX OF 06/25/2007, 02/2013  . THYROID NODULE   . HOH (hard of hearing)     both ears  . PONV (postoperative nausea and vomiting)   . Asthma     being worked up for asthma now. Chronic cough  . Anxiety   . Depression     takes lexapro  . History of kidney stones   . Anemia     Past Surgical History  Procedure Laterality Date  . Cesarean section      x's 2  . Meninogioma resection  07/25/09    pariatal area head-crainiotomy-plate  . Wisdom tooth extraction    . Breast lumpectomy with radioactive seed localization Right 11/19/2013    Procedure: BREAST LUMPECTOMY WITH RADIOACTIVE SEED LOCALIZATION;  Surgeon: Adin Hector, MD;  Location: [facility];  Service: General;  Laterality: Right;    History   Social History  . Marital Status: Married    Spouse Name: N/A  . Number of Children: N/A  . Years of Education: N/A   Occupational History  . Not on file.   Social History Main Topics  . Smoking status: Never Smoker   . Smokeless tobacco: Never Used     Comment: Married, lives with spouse and 2 kids. Pt is a Pharmacist, hospital  . Alcohol Use: No  . Drug Use: No  . Sexual Activity: Not on file   Other Topics Concern  . Not on file   Social History Narrative    Current Outpatient Prescriptions on File Prior to Visit  Medication Sig Dispense Refill  . acetaminophen (TYLENOL) 500 MG tablet Take 1,000 mg by mouth every 6 (six) hours as needed for mild pain (pain).     Marland Kitchen beclomethasone (QVAR) 40 MCG/ACT inhaler Take 2 puffs first thing in am and then another 2 puffs  about 12 hours later. 1 Inhaler 11  . diclofenac sodium (VOLTAREN) 1 % GEL Apply 2 g topically 4 (four) times daily. 200 g 3  . escitalopram (LEXAPRO) 20 MG tablet Take 20 mg by mouth daily.    . famotidine (PEPCID) 20 MG tablet One at bedtime (Patient taking differently: Take 20 mg by mouth at bedtime. One at bedtime) 30 tablet 11  . ferrous fumarate (HEMOCYTE - 106 MG FE) 325 (106 FE) MG TABS tablet Take 1 tablet by mouth.    . gabapentin (NEURONTIN) 100 MG capsule One four times daily (Patient taking differently: Take by mouth. One four times daily) 480 capsule 1  . gabapentin (NEURONTIN) 300 MG capsule Take 1 capsule (300 mg total) by mouth 3 (three) times daily. 30 capsule 2  . HYDROcodone-acetaminophen (NORCO) 5-325 MG per tablet One every 4 hours if can't stop coughing 30 tablet 0  . loratadine (CLARITIN) 10 MG tablet Take 10 mg by mouth daily.    . montelukast (SINGULAIR) 10 MG tablet Take 1 tablet (10 mg total) by mouth at bedtime. 30 tablet 11  . Multiple Vitamin (MULTIVITAMIN) capsule Take 1  capsule by mouth daily.    . pantoprazole (PROTONIX) 40 MG tablet TAKE 1 TABLET BY MOUTH EVERY DAY TAKE 30-60 MINUTES BEFORE FIRST MEAL OF THE DAY 90 tablet 0  . PROCTOSOL HC 2.5 % rectal cream Place 1 application rectally 2 (two) times daily.   2   No current facility-administered medications on file prior to visit.    Allergies  Allergen Reactions  . Ibuprofen Rash  . Levofloxacin     Cannot sleep  . Morphine And Related Itching  . Tramadol     Extreme sleeplessness/vomiting- can tolerate small dose per pt  . Adhesive [Tape] Rash  . Aleve [Naproxen Sodium] Rash  . Latex Rash    Family History  Problem Relation Age of Onset  . Diabetes Other   . Hyperlipidemia Other   . Stroke Other   . Ulcerative colitis Other   . Asthma Cousin     BP 102/70 mmHg  Pulse 92  Temp(Src) 98.3 F (36.8 C) (Oral)  Ht 5\' 3"  (1.6 m)  Wt 119 lb (53.978 kg)  BMI 21.09 kg/m2  SpO2 97%  Review of  Systems She has a slight cough    Objective:   Physical Exam VITAL SIGNS:  See vs page GENERAL: no distress NECK: A 2 cm left thyroid nodule is noted.  It is freely mobile.  No lymphadenopathy.    Lab Results  Component Value Date   TSH 0.89 02/12/2015       Assessment & Plan:  Thyroid nodule, clinically stable.  euthyroid  Patient is advised the following: Patient Instructions  blood tests are requested for you today.  We'll let you know about the results. Let's recheck the ultrasound.  you will receive a phone call, about a day and time for an appointment most of the time, a "lumpy thyroid" will eventually become overactive.  this is usually a slow process, happening over the span of many years.   Please return in 2 years.

## 2015-03-07 ENCOUNTER — Ambulatory Visit
Admission: RE | Admit: 2015-03-07 | Discharge: 2015-03-07 | Disposition: A | Discharge status: Home / Self Care | Payer: 59 | Source: Ambulatory Visit | Attending: Endocrinology | Admitting: Endocrinology

## 2015-03-07 DIAGNOSIS — E041 Nontoxic single thyroid nodule: Secondary | ICD-10-CM

## 2015-04-08 ENCOUNTER — Other Ambulatory Visit: Payer: Self-pay | Admitting: Internal Medicine

## 2015-05-06 ENCOUNTER — Other Ambulatory Visit (HOSPITAL_COMMUNITY): Payer: Self-pay | Admitting: Allergy and Immunology

## 2015-05-06 DIAGNOSIS — K219 Gastro-esophageal reflux disease without esophagitis: Secondary | ICD-10-CM

## 2015-05-08 ENCOUNTER — Other Ambulatory Visit (HOSPITAL_COMMUNITY): Payer: Self-pay | Admitting: Allergy and Immunology

## 2015-05-08 DIAGNOSIS — R1314 Dysphagia, pharyngoesophageal phase: Secondary | ICD-10-CM

## 2015-05-13 ENCOUNTER — Ambulatory Visit (HOSPITAL_COMMUNITY)
Admission: RE | Admit: 2015-05-13 | Discharge: 2015-05-13 | Disposition: A | Discharge status: Home / Self Care | Payer: BC Managed Care – PPO | Source: Ambulatory Visit | Attending: Allergy and Immunology | Admitting: Allergy and Immunology

## 2015-05-13 DIAGNOSIS — R1314 Dysphagia, pharyngoesophageal phase: Secondary | ICD-10-CM | POA: Insufficient documentation

## 2015-05-14 ENCOUNTER — Telehealth: Payer: Self-pay

## 2015-05-14 ENCOUNTER — Ambulatory Visit (HOSPITAL_COMMUNITY): Payer: 59

## 2015-05-14 ENCOUNTER — Telehealth: Payer: Self-pay | Admitting: Allergy and Immunology

## 2015-05-14 NOTE — Telephone Encounter (Signed)
Left message on answering machine for patient to call office back, to discuss test results.

## 2015-05-14 NOTE — Telephone Encounter (Signed)
Spoke with patient. Informed patient that, per Dr. Neldon Mc, her swallow test results were OK. She understood and will discuss this at her next OV.

## 2015-05-14 NOTE — Telephone Encounter (Signed)
-----   Message from Jiles Prows, MD sent at 05/14/2015  8:05 AM EDT ----- Please contact patient and inform her that test results were OK. Will discuss with RV.

## 2015-05-14 NOTE — Telephone Encounter (Signed)
Patient is returning phone call for her Lab Results.  Dol Vist 05/06/2015  Please Contact Patient.

## 2015-06-10 ENCOUNTER — Ambulatory Visit: Payer: Self-pay | Admitting: Allergy and Immunology

## 2015-06-10 ENCOUNTER — Ambulatory Visit (INDEPENDENT_AMBULATORY_CARE_PROVIDER_SITE_OTHER): Payer: BC Managed Care – PPO

## 2015-06-10 DIAGNOSIS — Z23 Encounter for immunization: Secondary | ICD-10-CM | POA: Diagnosis not present

## 2015-06-17 ENCOUNTER — Ambulatory Visit: Payer: BC Managed Care – PPO

## 2015-07-01 ENCOUNTER — Encounter: Payer: Self-pay | Admitting: Allergy and Immunology

## 2015-07-01 ENCOUNTER — Ambulatory Visit (INDEPENDENT_AMBULATORY_CARE_PROVIDER_SITE_OTHER): Payer: BC Managed Care – PPO | Admitting: Allergy and Immunology

## 2015-07-01 VITALS — BP 98/80 | HR 84 | Resp 18

## 2015-07-01 DIAGNOSIS — J387 Other diseases of larynx: Secondary | ICD-10-CM | POA: Diagnosis not present

## 2015-07-01 DIAGNOSIS — J3089 Other allergic rhinitis: Secondary | ICD-10-CM

## 2015-07-01 DIAGNOSIS — K219 Gastro-esophageal reflux disease without esophagitis: Secondary | ICD-10-CM

## 2015-07-01 DIAGNOSIS — J45991 Cough variant asthma: Secondary | ICD-10-CM | POA: Diagnosis not present

## 2015-07-01 MED ORDER — OMEPRAZOLE 40 MG PO CPDR: 40.0000 mg | DELAYED_RELEASE_CAPSULE | Freq: Every morning | ORAL | Status: DC

## 2015-07-01 MED ORDER — RANITIDINE HCL 300 MG PO TABS: 300.0000 mg | ORAL_TABLET | Freq: Every day | ORAL | Status: DC

## 2015-07-01 NOTE — Patient Instructions (Signed)
  1. Sample of Asmanex HFA 200 - two inhalations two times a day with spacer  2. Continue omeprazole 40 in AM + Ranitidine 300 in PM  3. Continue OTC Rhinocort one spray each nostril one time per day  4. Continue ProAir HFA if needed.  5. Stop montelukast  6. Return in 6 weeks.  7. Call clinic if cough with Asmanex.

## 2015-07-01 NOTE — Progress Notes (Signed)
[facility]  Follow-up Note  Refering Provider: Rowe Clack, MD Primary Provider: Gwendolyn Grant, MD  Subjective:   Deborah Ortega is a 49 y.o. female who returns to the [facility] in re-evaluation of the following:  HPI Comments:  Aile returns to this clinic in reevaluation of her cough. She could not tolerate the Qvar as she developed severe coughing whenever she took one puff. Thus, she is not been receiving an inhaled steroid. She does continue to use her Rhinocort and omeprazole and ranitidine. She's been intermittent about using montelukast. She's had no need to use pro-air. Basically she thinks that she's the same regarding her cough and throat clearing and occasional glob stuck in her throat. She's not had much choking or gagging spells. Maybe the lump in her throat is somewhat better.   Outpatient Encounter Prescriptions as of 07/01/2015  Medication Sig  . acetaminophen (TYLENOL) 500 MG tablet Take 1,000 mg by mouth every 6 (six) hours as needed for mild pain (pain).   Marland Kitchen diclofenac sodium (VOLTAREN) 1 % GEL Apply 2 g topically 4 (four) times daily.  Mariane Baumgarten Calcium (STOOL SOFTENER PO) Take by mouth.  . escitalopram (LEXAPRO) 20 MG tablet Take 20 mg by mouth daily.  . IRON PO Take by mouth.  . loratadine (CLARITIN) 10 MG tablet Take 10 mg by mouth daily.  . Multiple Vitamin (MULTIVITAMIN) capsule Take 1 capsule by mouth daily.  Marland Kitchen omeprazole (PRILOSEC) 40 MG capsule Take 40 mg by mouth every morning.  . ranitidine (ZANTAC) 300 MG tablet Take 300 mg by mouth at bedtime.  . beclomethasone (QVAR) 40 MCG/ACT inhaler Take 2 puffs first thing in am and then another 2 puffs about 12 hours later. (Patient not taking: Reported on 07/01/2015)  . famotidine (PEPCID) 20 MG tablet One at bedtime (Patient not taking: Reported on 07/01/2015)  . ferrous fumarate (HEMOCYTE - 106 MG FE) 325 (106 FE) MG TABS  tablet Take 1 tablet by mouth.  . gabapentin (NEURONTIN) 100 MG capsule One four times daily (Patient not taking: Reported on 07/01/2015)  . gabapentin (NEURONTIN) 300 MG capsule Take 1 capsule (300 mg total) by mouth 3 (three) times daily. (Patient not taking: Reported on 07/01/2015)  . HYDROcodone-acetaminophen (NORCO) 5-325 MG per tablet One every 4 hours if can't stop coughing (Patient not taking: Reported on 07/01/2015)  . montelukast (SINGULAIR) 10 MG tablet Take 1 tablet (10 mg total) by mouth at bedtime. (Patient not taking: Reported on 07/01/2015)  . pantoprazole (PROTONIX) 40 MG tablet TAKE 1 TABLET BY MOUTH EVERY DAY. TAKE 30-60 MINUTES BEFORE FIRST MEAL OF THE DAY (Patient not taking: Reported on 07/01/2015)  . PROCTOSOL HC 2.5 % rectal cream Place 1 application rectally 2 (two) times daily.    No facility-administered encounter medications on file as of 07/01/2015.    No orders of the defined types were placed in this encounter.    Past Medical History  Diagnosis Date  . ALLERGIC RHINITIS   . GERD   . 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 MIGRAINE HEADACHE   . RENAL CALCULUS, HX OF 06/25/2007, 02/2013  . THYROID NODULE   . HOH (hard of hearing)     both ears  . PONV (postoperative nausea and vomiting)   . Asthma     being worked up for asthma now. Chronic cough  . Anxiety   . Depression  takes lexapro  . History of kidney stones   . Anemia     Past Surgical History  Procedure Laterality Date  . Cesarean section      x's 2  . Meninogioma resection  07/25/09    pariatal area head-crainiotomy-plate  . Wisdom tooth extraction    . Breast lumpectomy with radioactive seed localization Right 11/19/2013    Procedure: BREAST LUMPECTOMY WITH RADIOACTIVE SEED LOCALIZATION;  Surgeon: Adin Hector, MD;  Location: [facility];  Service: General;  Laterality: Right;    Allergies  Allergen Reactions  . Ibuprofen Rash  . Levofloxacin     Cannot sleep   . Morphine And Related Itching  . Tramadol     Extreme sleeplessness/vomiting- can tolerate small dose per pt  . Adhesive [Tape] Rash  . Aleve [Naproxen Sodium] Rash  . Latex Rash    Review of Systems  Constitutional: Negative for fever and chills.  HENT: Negative for congestion, ear pain, facial swelling, nosebleeds, postnasal drip, rhinorrhea, sinus pressure, sneezing, sore throat, tinnitus, trouble swallowing and voice change.   Eyes: Negative for pain, discharge, redness and itching.  Respiratory: Positive for cough. Negative for choking, chest tightness, shortness of breath, wheezing and stridor.   Cardiovascular: Negative for chest pain and leg swelling.  Gastrointestinal: Positive for nausea. Negative for vomiting and abdominal pain.  Endocrine: Negative for cold intolerance and heat intolerance.  Genitourinary: Negative for difficulty urinating.  Musculoskeletal: Negative for myalgias and arthralgias.  Allergic/Immunologic: Negative.   Neurological: Negative for dizziness.  Hematological: Negative for adenopathy.     Objective:   Filed Vitals:   07/01/15 1702  BP: 98/80  Pulse: 84  Resp: 18          Physical Exam  Constitutional: She appears well-developed and well-nourished. No distress.  HENT:  Head: Normocephalic and atraumatic. Head is without right periorbital erythema and without left periorbital erythema.  Right Ear: Tympanic membrane, external ear and ear canal normal. No drainage or tenderness. No foreign bodies. Tympanic membrane is not injected, not scarred, not perforated, not erythematous, not retracted and not bulging. No middle ear effusion.  Left Ear: Tympanic membrane, external ear and ear canal normal. No drainage or tenderness. No foreign bodies. Tympanic membrane is not injected, not scarred, not perforated, not erythematous, not retracted and not bulging.  No middle ear effusion.  Nose: Nose normal. No mucosal edema, rhinorrhea, nose lacerations  or sinus tenderness.  No foreign bodies.  Mouth/Throat: Oropharynx is clear and moist. No oropharyngeal exudate, posterior oropharyngeal edema, posterior oropharyngeal erythema or tonsillar abscesses.  Eyes: Lids are normal. Right eye exhibits no chemosis, no discharge and no exudate. No foreign body present in the right eye. Left eye exhibits no chemosis, no discharge and no exudate. No foreign body present in the left eye. Right conjunctiva is not injected. Left conjunctiva is not injected.  Neck: Neck supple. No tracheal tenderness present. No tracheal deviation and no edema present. No thyroid mass and no thyromegaly present.  Cardiovascular: Normal rate, regular rhythm, S1 normal and S2 normal.  Exam reveals no gallop.   No murmur heard. Pulmonary/Chest: No accessory muscle usage or stridor. No respiratory distress. She has no wheezes. She has no rhonchi. She has no rales.  Abdominal: Soft.  Lymphadenopathy:       Head (right side): No tonsillar adenopathy present.       Head (left side): No tonsillar adenopathy present.    She has no cervical adenopathy.  Neurological: She  is alert.  Skin: No rash noted. She is not diaphoretic.  Psychiatric: She has a normal mood and affect. Her behavior is normal.    Diagnostics: The results of a barium swallow performed on 05/13/2015 are as follows:  Pt presents with functional swallow with no aspiration/penetration nor significant residuals. She does piecemeal with all boluses - which is effective for her. In addition, minimal pyriform sinus residuals noted that pt sensed and cleared independently. Of note, pt cleared her throat before, during and after MBS a few times. Reflux symptom index administered to pt with her scoring 40/45; coorelating with high liklihood of laryngopharyngeal reflux. Given pt has premorbid diagnosis of GERD, provided her with reflux precautions. Also, upon esophageal sweep after pt consumed thin with barium tablet, tablet  appeared to lodge at proximal thoracic esophagus with pt sensing in pharynx. Furhter boluses of liquids faciliate clearance.  Assessment and Plan:   1. Cough variant asthma   2. LPRD (laryngopharyngeal reflux disease)   3. Other allergic rhinitis      1. Sample of Asmanex HFA 200 - two inhalations two times a day with spacer  2. Continue omeprazole 40 in AM + Ranitidine 300 in PM  3. Continue OTC Rhinocort one spray each nostril one time per day  4. Continue ProAir HFA if needed.  5. Stop montelukast  6. Return in 6 weeks.  7. Call clinic if cough with Asmanex.  I think that states he does have a component of cough variant asthma and we will answer that question of whether or not it's a true entity if we can get her to utilize an inhaled steroid on a regular basis and we'll try her on a sample of Asmanex at this point. It will probably take several weeks to see whether or not we get a good response. She also has a history very consistent with laryngopharyngeal reflux and we'll keep her on a combination of omeprazole and ranitidine. If she continues to have problems with cough and her throat after several months of this therapy and we'll refer her onto the voice disorder Center at [facility]   Allena Katz, MD [place]

## 2015-07-11 ENCOUNTER — Encounter: Payer: Self-pay | Admitting: Family

## 2015-07-11 ENCOUNTER — Ambulatory Visit (INDEPENDENT_AMBULATORY_CARE_PROVIDER_SITE_OTHER): Payer: BC Managed Care – PPO | Admitting: Family

## 2015-07-11 VITALS — BP 98/70 | HR 85 | Temp 98.3°F | Resp 16 | Wt 121.0 lb

## 2015-07-11 DIAGNOSIS — J0100 Acute maxillary sinusitis, unspecified: Secondary | ICD-10-CM

## 2015-07-11 DIAGNOSIS — J019 Acute sinusitis, unspecified: Secondary | ICD-10-CM | POA: Insufficient documentation

## 2015-07-11 MED ORDER — PROMETHAZINE-DM 6.25-15 MG/5ML PO SYRP: 5.0000 mL | ORAL_SOLUTION | Freq: Four times a day (QID) | ORAL | Status: DC | PRN

## 2015-07-11 MED ORDER — FLUCONAZOLE 150 MG PO TABS: 150.0000 mg | ORAL_TABLET | Freq: Once | ORAL | Status: DC

## 2015-07-11 MED ORDER — AMOXICILLIN-POT CLAVULANATE 875-125 MG PO TABS: 1.0000 | ORAL_TABLET | Freq: Two times a day (BID) | ORAL | Status: DC

## 2015-07-11 NOTE — Progress Notes (Signed)
Subjective:    Patient ID: Deborah Ortega, female    DOB: 31-Jul-1966, 49 y.o.   MRN: [id number]  Chief Complaint  Patient presents with  . Cough    xs 1 week, some production with cough, nasal congestion, body aches.     HPI:  Deborah Ortega is a 49 y.o. female who  has a past medical history of ALLERGIC RHINITIS; GERD; MENINGIOMA; MIGRAINE HEADACHE; RENAL CALCULUS, HX OF (06/25/2007, 02/2013); THYROID NODULE; HOH (hard of hearing); PONV (postoperative nausea and vomiting); Asthma; Anxiety; Depression; History of kidney stones; and Anemia. and presents today for an acute office visit.   Associated symptom of productive cough, nasal congestion and body aches hemming going on for approximately one week. Believes she may have been feeling better yesterday but today has gotten significantly worse. The symptoms are enough to effect her sleep. Modifying factors include Tylenol and benedryl which have not helped very much. Denies any recent antibiotic use.    Allergies  Allergen Reactions  . Ibuprofen Rash  . Levofloxacin     Cannot sleep  . Morphine And Related Itching  . Tramadol     Extreme sleeplessness/vomiting- can tolerate small dose per pt  . Adhesive [Tape] Rash  . Aleve [Naproxen Sodium] Rash  . Latex Rash     Current Outpatient Prescriptions on File Prior to Visit  Medication Sig Dispense Refill  . acetaminophen (TYLENOL) 500 MG tablet Take 1,000 mg by mouth every 6 (six) hours as needed for mild pain (pain).     Marland Kitchen diclofenac sodium (VOLTAREN) 1 % GEL Apply 2 g topically 4 (four) times daily. 200 g 3  . Docusate Calcium (STOOL SOFTENER PO) Take by mouth.    . escitalopram (LEXAPRO) 20 MG tablet Take 20 mg by mouth daily.    . famotidine (PEPCID) 20 MG tablet One at bedtime 30 tablet 11  . ferrous fumarate (HEMOCYTE - 106 MG FE) 325 (106 FE) MG TABS tablet Take 1 tablet by mouth.    . IRON PO Take by mouth.    . loratadine (CLARITIN) 10 MG tablet Take 10 mg by mouth daily.     . montelukast (SINGULAIR) 10 MG tablet Take 1 tablet (10 mg total) by mouth at bedtime. 30 tablet 11  . Multiple Vitamin (MULTIVITAMIN) capsule Take 1 capsule by mouth daily.    Marland Kitchen omeprazole (PRILOSEC) 40 MG capsule Take 1 capsule (40 mg total) by mouth every morning. 30 capsule 5  . pantoprazole (PROTONIX) 40 MG tablet TAKE 1 TABLET BY MOUTH EVERY DAY. TAKE 30-60 MINUTES BEFORE FIRST MEAL OF THE DAY 90 tablet 0  . PROCTOSOL HC 2.5 % rectal cream Place 1 application rectally 2 (two) times daily.   2  . ranitidine (ZANTAC) 300 MG tablet Take 1 tablet (300 mg total) by mouth at bedtime. 30 tablet 5   No current facility-administered medications on file prior to visit.    Review of Systems  Constitutional: Negative for fever and chills.  HENT: Positive for congestion, sinus pressure and sore throat.   Respiratory: Positive for cough. Negative for chest tightness and shortness of breath.   Neurological: Negative for headaches.      Objective:    BP 98/70 mmHg  Pulse 85  Temp(Src) 98.3 F (36.8 C) (Oral)  Resp 16  Wt 121 lb (54.885 kg)  SpO2 97% Nursing note and vital signs reviewed.  Physical Exam  Constitutional: She is oriented to person, place, and time. She appears well-developed and  well-nourished. No distress.  HENT:  Right Ear: Hearing, tympanic membrane, external ear and ear canal normal.  Left Ear: Hearing, tympanic membrane, external ear and ear canal normal.  Nose: Right sinus exhibits maxillary sinus tenderness and frontal sinus tenderness. Left sinus exhibits maxillary sinus tenderness and frontal sinus tenderness.  Mouth/Throat: Uvula is midline, oropharynx is clear and moist and mucous membranes are normal.  Neck: Neck supple.  Cardiovascular: Normal rate, regular rhythm, normal heart sounds and intact distal pulses.   Pulmonary/Chest: Effort normal and breath sounds normal.  Neurological: She is alert and oriented to person, place, and time.  Skin: Skin is warm  and dry.  Psychiatric: She has a normal mood and affect. Her behavior is normal. Judgment and thought content normal.       Assessment & Plan:   Problem List Items Addressed This Visit      Respiratory   Sinusitis, acute - Primary    Symptoms and exam consistent with bacterial sinusitis. Start Augmentin. Start promethazine DM as needed for cough and sleep. Start fluconazole as needed for post antibiotic candidiasis. Continue over-the-counter medications as needed for symptom relief and supportive care. Follow-up if symptoms worsen or fail to improve.      Relevant Medications   amoxicillin-clavulanate (AUGMENTIN) 875-125 MG tablet   fluconazole (DIFLUCAN) 150 MG tablet   promethazine-dextromethorphan (PROMETHAZINE-DM) 6.25-15 MG/5ML syrup

## 2015-07-11 NOTE — Assessment & Plan Note (Signed)
Symptoms and exam consistent with bacterial sinusitis. Start Augmentin. Start promethazine DM as needed for cough and sleep. Start fluconazole as needed for post antibiotic candidiasis. Continue over-the-counter medications as needed for symptom relief and supportive care. Follow-up if symptoms worsen or fail to improve.

## 2015-07-11 NOTE — Patient Instructions (Signed)
Thank you for choosing  HealthCare.  Summary/Instructions:  Your prescription(s) have been submitted to your pharmacy or been printed and provided for you. Please take as directed and contact our office if you believe you are having problem(s) with the medication(s) or have any questions.  If your symptoms worsen or fail to improve, please contact our office for further instruction, or in case of emergency go directly to the emergency room at the closest medical facility.   General Recommendations:    Please drink plenty of fluids.  Get plenty of rest   Sleep in humidified air  Use saline nasal sprays  Netti pot   OTC Medications:  Decongestants - helps relieve congestion   Flonase (generic fluticasone) or Nasacort (generic triamcinolone) - please make sure to use the "cross-over" technique at a 45 degree angle towards the opposite eye as opposed to straight up the nasal passageway.   Sudafed (generic pseudoephedrine - Note this is the one that is available behind the pharmacy counter); Products with phenylephrine (-PE) may also be used but is often not as effective as pseudoephedrine.   If you have HIGH BLOOD PRESSURE - Coricidin HBP; AVOID any product that is -D as this contains pseudoephedrine which may increase your blood pressure.  Afrin (oxymetazoline) every 6-8 hours for up to 3 days.   Allergies - helps relieve runny nose, itchy eyes and sneezing   Claritin (generic loratidine), Allegra (fexofenidine), or Zyrtec (generic cyrterizine) for runny nose. These medications should not cause drowsiness.  Note - Benadryl (generic diphenhydramine) may be used however may cause drowsiness  Cough -   Delsym or Robitussin (generic dextromethorphan)  Expectorants - helps loosen mucus to ease removal   Mucinex (generic guaifenesin) as directed on the package.  Headaches / General Aches   Tylenol (generic acetaminophen) - DO NOT EXCEED 3 grams (3,000 mg) in a 24  hour time period  Advil/Motrin (generic ibuprofen)   Sore Throat -   Salt water gargle   Chloraseptic (generic benzocaine) spray or lozenges / Sucrets (generic dyclonine)    Sinusitis Sinusitis is redness, soreness, and inflammation of the paranasal sinuses. Paranasal sinuses are air pockets within the bones of your face (beneath the eyes, the middle of the forehead, or above the eyes). In healthy paranasal sinuses, mucus is able to drain out, and air is able to circulate through them by way of your nose. However, when your paranasal sinuses are inflamed, mucus and air can become trapped. This can allow bacteria and other germs to grow and cause infection. Sinusitis can develop quickly and last only a short time (acute) or continue over a long period (chronic). Sinusitis that lasts for more than 12 weeks is considered chronic.  CAUSES  Causes of sinusitis include:  Allergies.  Structural abnormalities, such as displacement of the cartilage that separates your nostrils (deviated septum), which can decrease the air flow through your nose and sinuses and affect sinus drainage.  Functional abnormalities, such as when the small hairs (cilia) that line your sinuses and help remove mucus do not work properly or are not present. SIGNS AND SYMPTOMS  Symptoms of acute and chronic sinusitis are the same. The primary symptoms are pain and pressure around the affected sinuses. Other symptoms include:  Upper toothache.  Earache.  Headache.  Bad breath.  Decreased sense of smell and taste.  A cough, which worsens when you are lying flat.  Fatigue.  Fever.  Thick drainage from your nose, which often is green and may   contain pus (purulent).  Swelling and warmth over the affected sinuses. DIAGNOSIS  Your health care provider will perform a physical exam. During the exam, your health care provider may:  Look in your nose for signs of abnormal growths in your nostrils (nasal  polyps).  Tap over the affected sinus to check for signs of infection.  View the inside of your sinuses (endoscopy) using an imaging device that has a light attached (endoscope). If your health care provider suspects that you have chronic sinusitis, one or more of the following tests may be recommended:  Allergy tests.  Nasal culture. A sample of mucus is taken from your nose, sent to a lab, and screened for bacteria.  Nasal cytology. A sample of mucus is taken from your nose and examined by your health care provider to determine if your sinusitis is related to an allergy. TREATMENT  Most cases of acute sinusitis are related to a viral infection and will resolve on their own within 10 days. Sometimes medicines are prescribed to help relieve symptoms (pain medicine, decongestants, nasal steroid sprays, or saline sprays).  However, for sinusitis related to a bacterial infection, your health care provider will prescribe antibiotic medicines. These are medicines that will help kill the bacteria causing the infection.  Rarely, sinusitis is caused by a fungal infection. In theses cases, your health care provider will prescribe antifungal medicine. For some cases of chronic sinusitis, surgery is needed. Generally, these are cases in which sinusitis recurs more than 3 times per year, despite other treatments. HOME CARE INSTRUCTIONS   Drink plenty of water. Water helps thin the mucus so your sinuses can drain more easily.  Use a humidifier.  Inhale steam 3 to 4 times a day (for example, sit in the bathroom with the shower running).  Apply a warm, moist washcloth to your face 3 to 4 times a day, or as directed by your health care provider.  Use saline nasal sprays to help moisten and clean your sinuses.  Take medicines only as directed by your health care provider.  If you were prescribed either an antibiotic or antifungal medicine, finish it all even if you start to feel better. SEEK IMMEDIATE  MEDICAL CARE IF:  You have increasing pain or severe headaches.  You have nausea, vomiting, or drowsiness.  You have swelling around your face.  You have vision problems.  You have a stiff neck.  You have difficulty breathing. MAKE SURE YOU:   Understand these instructions.  Will watch your condition.  Will get help right away if you are not doing well or get worse. Document Released: 08/02/2005 Document Revised: 12/17/2013 Document Reviewed: 08/17/2011 ExitCare Patient Information 2015 [employer]. This information is not intended to replace advice given to you by your health care provider. Make sure you discuss any questions you have with your health care provider.   

## 2015-07-11 NOTE — Progress Notes (Signed)
Pre visit review using our clinic review tool, if applicable. No additional management support is needed unless otherwise documented below in the visit note. 

## 2015-08-19 ENCOUNTER — Encounter: Payer: Self-pay | Admitting: Allergy and Immunology

## 2015-08-19 ENCOUNTER — Ambulatory Visit (INDEPENDENT_AMBULATORY_CARE_PROVIDER_SITE_OTHER): Payer: BC Managed Care – PPO | Admitting: Allergy and Immunology

## 2015-08-19 VITALS — BP 110/72 | HR 76 | Resp 20

## 2015-08-19 DIAGNOSIS — J387 Other diseases of larynx: Secondary | ICD-10-CM | POA: Diagnosis not present

## 2015-08-19 DIAGNOSIS — J45991 Cough variant asthma: Secondary | ICD-10-CM | POA: Diagnosis not present

## 2015-08-19 DIAGNOSIS — K219 Gastro-esophageal reflux disease without esophagitis: Secondary | ICD-10-CM

## 2015-08-19 DIAGNOSIS — J3089 Other allergic rhinitis: Secondary | ICD-10-CM | POA: Diagnosis not present

## 2015-08-19 NOTE — Progress Notes (Signed)
[facility]  Follow-up Note  Referring Provider: Rowe Clack, MD Primary Provider: Hoyt Koch, MD Date of Office Visit: 08/19/2015  Subjective:   Deborah Ortega is a 50 y.o. female who returns to the [facility] in re-evaluation of the following:  HPI Comments:  Deborah Ortega returns to this clinic on 08/19/2015 in reevaluation of her respiratory tract symptoms. Once again, she is not really notice any improvement with therapy administered during her last visit. She's been using her Asmanex and her proton pump inhibitor and her H2 receptor blocker and a consistent basis. She has the same fits of cough associated with throat clearing and some inability to speak. She still occasionally has the glob stuck in her throat. She was able to tolerate the Asmanex as it did not make her cough when she uses this agent but really didn't help her situation at all.   Current Outpatient Prescriptions on File Prior to Visit  Medication Sig Dispense Refill  . Docusate Calcium (STOOL SOFTENER PO) Take by mouth.    . escitalopram (LEXAPRO) 20 MG tablet Take 20 mg by mouth daily.    . ferrous fumarate (HEMOCYTE - 106 MG FE) 325 (106 FE) MG TABS tablet Take 1 tablet by mouth.    . IRON PO Take by mouth.    . loratadine (CLARITIN) 10 MG tablet Take 10 mg by mouth daily. Reported on 08/19/2015    . mometasone (ASMANEX) 220 MCG/INH inhaler Inhale 2 puffs into the lungs daily.    . montelukast (SINGULAIR) 10 MG tablet Take 1 tablet (10 mg total) by mouth at bedtime. 30 tablet 11  . Multiple Vitamin (MULTIVITAMIN) capsule Take 1 capsule by mouth daily.    Marland Kitchen omeprazole (PRILOSEC) 40 MG capsule Take 1 capsule (40 mg total) by mouth every morning. 30 capsule 5  . ranitidine (ZANTAC) 300 MG tablet Take 1 tablet (300 mg total) by mouth at bedtime. 30 tablet 5  . acetaminophen (TYLENOL) 500 MG tablet Take 1,000 mg by mouth every 6 (six)  hours as needed for mild pain (pain). Reported on 08/19/2015    . famotidine (PEPCID) 20 MG tablet One at bedtime (Patient not taking: Reported on 08/19/2015) 30 tablet 11  . promethazine-dextromethorphan (PROMETHAZINE-DM) 6.25-15 MG/5ML syrup Take 5 mLs by mouth 4 (four) times daily as needed. (Patient not taking: Reported on 08/19/2015) 118 mL 0   No current facility-administered medications on file prior to visit.    No orders of the defined types were placed in this encounter.    Past Medical History  Diagnosis Date  . ALLERGIC RHINITIS   . GERD   . 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 MIGRAINE HEADACHE   . RENAL CALCULUS, HX OF 06/25/2007, 02/2013  . THYROID NODULE   . HOH (hard of hearing)     both ears  . PONV (postoperative nausea and vomiting)   . Asthma     being worked up for asthma now. Chronic cough  . Anxiety   . Depression     takes lexapro  . History of kidney stones   . Anemia     Past Surgical History  Procedure Laterality Date  . Cesarean section      x's 2  . Meninogioma resection  07/25/09    pariatal area head-crainiotomy-plate  . Wisdom tooth extraction    . Breast lumpectomy with radioactive seed localization Right 11/19/2013  Procedure: BREAST LUMPECTOMY WITH RADIOACTIVE SEED LOCALIZATION;  Surgeon: Adin Hector, MD;  Location: [place];  Service: General;  Laterality: Right;    Allergies  Allergen Reactions  . Ibuprofen Rash  . Levofloxacin     Cannot sleep  . Morphine And Related Itching  . Tramadol     Extreme sleeplessness/vomiting- can tolerate small dose per pt  . Adhesive [Tape] Rash  . Aleve [Naproxen Sodium] Rash  . Latex Rash    Review of systems negative except as noted in HPI / PMHx or noted below:  Review of Systems  Constitutional: Negative.   HENT: Negative.   Eyes: Negative.   Respiratory: Negative.   Cardiovascular: Negative.   Gastrointestinal: Negative.   Genitourinary: Negative.    Musculoskeletal: Negative.   Skin: Negative.   Neurological: Negative.   Endo/Heme/Allergies: Negative.   Psychiatric/Behavioral: Negative.      Objective:   Filed Vitals:   08/19/15 1707  BP: 110/72  Pulse: 76  Resp: 20          Physical Exam  Constitutional: She is well-developed, well-nourished, and in no distress. No distress.  HENT:  Head: Normocephalic.  Right Ear: Tympanic membrane, external ear and ear canal normal.  Left Ear: Tympanic membrane, external ear and ear canal normal.  Nose: Nose normal. No mucosal edema or rhinorrhea.  Mouth/Throat: Uvula is midline, oropharynx is clear and moist and mucous membranes are normal. No oropharyngeal exudate.  Eyes: Conjunctivae are normal.  Neck: Trachea normal. No tracheal tenderness present. No tracheal deviation present. No thyromegaly present.  Cardiovascular: Normal rate, regular rhythm, S1 normal, S2 normal and normal heart sounds.   No murmur heard. Pulmonary/Chest: Breath sounds normal. No stridor. No respiratory distress. She has no wheezes. She has no rales.  Musculoskeletal: She exhibits no edema.  Lymphadenopathy:       Head (right side): No tonsillar adenopathy present.       Head (left side): No tonsillar adenopathy present.    She has no cervical adenopathy.    She has no axillary adenopathy.  Neurological: She is alert. Gait normal.  Skin: No rash noted. She is not diaphoretic. No erythema. Nails show no clubbing.  Psychiatric: Mood and affect normal.    Diagnostics:    Spirometry was performed and demonstrated an FEV1 of 2.23 at 83 % of predicted.  The patient had an Asthma Control Test with the following results:  .    Assessment and Plan:   1. LPRD (laryngopharyngeal reflux disease)   2. Cough variant asthma   3. Other allergic rhinitis     1. Stop Asmanex    2. Continue omeprazole 40 in AM + Ranitidine 300 in PM  3. Continue OTC Rhinocort one spray each nostril one time per day  4.  Continue ProAir HFA if needed.  5. Stop montelukast  6. Appointment with [facility] [place].  7. Call clinic if not doing well. - Return to clinic 12 weeks after [facility] visit.  I think the best approach for Deborah Ortega's condition is to have her evaluated a wake [facility] disorder [facility]. Her history is very consistent with LPR but she has been less than responsive to pretty aggressive therapy directed against this condition. Her previous modified barium swallow did not identify any significant abnormality in her swallowing mechanics suggesting that a neurological-induced laryngeal issue is probably not the cause of her fits of coughing and throat clearing and inability to speak. I'm not  really sure that Asmanex is helped her to any large degree and will do the reverse experiment at this point time by discontinuing this agent to see if she does develop more respiratory tract symptoms. We'll see all things go after her visit at [facility].    Allena Katz, MD [place]

## 2015-08-19 NOTE — Patient Instructions (Signed)
  1. Stop Asmanex    2. Continue omeprazole 40 in AM + Ranitidine 300 in PM  3. Continue OTC Rhinocort one spray each nostril one time per day  4. Continue ProAir HFA if needed.  5. Stop montelukast  6. Appointment with [facility] [place].  7. Call clinic if not doing well. - Return to clinic 12 weeks after [facility] visit.

## 2015-08-20 ENCOUNTER — Telehealth: Payer: Self-pay

## 2015-08-20 NOTE — Telephone Encounter (Signed)
Called to inform patient that we have scheduled her referral appointment with Dr. Rowe Clack at [facility] on 09/04/15 at [place].  The address is [address] [place], [phone].

## 2015-08-21 NOTE — Telephone Encounter (Signed)
Patient informed. 

## 2015-09-04 ENCOUNTER — Encounter: Payer: Self-pay | Admitting: Adult Health

## 2015-09-04 ENCOUNTER — Telehealth: Payer: Self-pay | Admitting: Internal Medicine

## 2015-09-04 ENCOUNTER — Ambulatory Visit (INDEPENDENT_AMBULATORY_CARE_PROVIDER_SITE_OTHER): Payer: BC Managed Care – PPO | Admitting: Adult Health

## 2015-09-04 VITALS — BP 94/64 | Temp 98.4°F | Ht 63.0 in | Wt 124.0 lb

## 2015-09-04 DIAGNOSIS — R9431 Abnormal electrocardiogram [ECG] [EKG]: Secondary | ICD-10-CM | POA: Diagnosis not present

## 2015-09-04 NOTE — Telephone Encounter (Signed)
Fyi.

## 2015-09-04 NOTE — Progress Notes (Signed)
Subjective:    Patient ID: Deborah Ortega, female    DOB: [birth year], 50 y.o.   MRN: [id number]  HPI  50 year old healthy and active female, presents to the office today after being at her dermatologists office and was found to have an abnormal EKG. She is participating in a Botox study and the office was doing pre procedure testing, her EKG in the office showed a possible T-wave abnormality. She has the EKG on her phone.   She denies any chest pain, SOB, or palpitations.   Review of Systems  Constitutional: Negative.   Respiratory: Negative.   Cardiovascular: Negative.   Gastrointestinal: Negative.   Neurological: Negative.    Past Medical History  Diagnosis Date  . ALLERGIC RHINITIS   . GERD   . 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 MIGRAINE HEADACHE   . RENAL CALCULUS, HX OF 06/25/2007, 02/2013  . THYROID NODULE   . HOH (hard of hearing)     both ears  . PONV (postoperative nausea and vomiting)   . Asthma     being worked up for asthma now. Chronic cough  . Anxiety   . Depression     takes lexapro  . History of kidney stones   . Anemia     Social History   Social History  . Marital Status: Married    Spouse Name: N/A  . Number of Children: N/A  . Years of Education: N/A   Occupational History  . Not on file.   Social History Main Topics  . Smoking status: Never Smoker   . Smokeless tobacco: Never Used     Comment: Married, lives with spouse and 2 kids. Pt is a Pharmacist, hospital  . Alcohol Use: No  . Drug Use: No  . Sexual Activity: Not on file   Other Topics Concern  . Not on file   Social History Narrative    Past Surgical History  Procedure Laterality Date  . Cesarean section      x's 2  . Meninogioma resection  07/25/09    pariatal area head-crainiotomy-plate  . Wisdom tooth extraction    . Breast lumpectomy with radioactive seed localization Right 11/19/2013    Procedure: BREAST LUMPECTOMY WITH RADIOACTIVE SEED LOCALIZATION;  Surgeon: Adin Hector, MD;  Location: [facility];  Service: General;  Laterality: Right;    Family History  Problem Relation Age of Onset  . Diabetes Other   . Hyperlipidemia Other   . Stroke Other   . Ulcerative colitis Other   . Asthma Cousin     Allergies  Allergen Reactions  . Ibuprofen Rash  . Levofloxacin     Cannot sleep  . Morphine And Related Itching  . Tramadol     Extreme sleeplessness/vomiting- can tolerate small dose per pt  . Adhesive [Tape] Rash  . Aleve [Naproxen Sodium] Rash  . Latex Rash    Current Outpatient Prescriptions on File Prior to Visit  Medication Sig Dispense Refill  . acetaminophen (TYLENOL) 500 MG tablet Take 1,000 mg by mouth every 6 (six) hours as needed for mild pain (pain). Reported on 08/19/2015    . escitalopram (LEXAPRO) 20 MG tablet Take 20 mg by mouth daily.    . famotidine (PEPCID) 20 MG tablet One at bedtime 30 tablet 11  . ferrous fumarate (HEMOCYTE - 106 MG FE) 325 (106 FE) MG TABS tablet Take 1 tablet by mouth.    . IRON PO Take  by mouth.    . loratadine (CLARITIN) 10 MG tablet Take 10 mg by mouth daily. Reported on 08/19/2015    . mometasone (ASMANEX) 220 MCG/INH inhaler Inhale 2 puffs into the lungs daily.    . montelukast (SINGULAIR) 10 MG tablet Take 1 tablet (10 mg total) by mouth at bedtime. 30 tablet 11  . Multiple Vitamin (MULTIVITAMIN) capsule Take 1 capsule by mouth daily.    Marland Kitchen omeprazole (PRILOSEC) 40 MG capsule Take 1 capsule (40 mg total) by mouth every morning. 30 capsule 5  . promethazine-dextromethorphan (PROMETHAZINE-DM) 6.25-15 MG/5ML syrup Take 5 mLs by mouth 4 (four) times daily as needed. 118 mL 0  . ranitidine (ZANTAC) 300 MG tablet Take 1 tablet (300 mg total) by mouth at bedtime. 30 tablet 5   No current facility-administered medications on file prior to visit.    BP 94/64 mmHg  Temp(Src) 98.4 F (36.9 C) (Oral)  Ht 5\' 3"  (1.6 m)  Wt 124 lb (56.246 kg)  BMI 21.97 kg/m2       Objective:   Physical  Exam  Constitutional: She is oriented to person, place, and time. She appears well-developed and well-nourished. No distress.  Cardiovascular: Normal rate, regular rhythm, normal heart sounds and intact distal pulses.  Exam reveals no gallop and no friction rub.   No murmur heard. Pulmonary/Chest: Effort normal and breath sounds normal. No respiratory distress. She has no wheezes. She has no rales. She exhibits no tenderness.  Neurological: She is alert and oriented to person, place, and time.  Skin: Skin is warm and dry. No rash noted. She is not diaphoretic. No erythema. No pallor.  Psychiatric: She has a normal mood and affect. Her behavior is normal. Judgment and thought content normal.  Nursing note and vitals reviewed.     Assessment & Plan:  1. Nonspecific abnormal electrocardiogram (ECG) (EKG) - Reviewed EKG from dermatology office. I am not convinced of a T wave abnormality. May have been artifact.  - EKG 12-Lead - NSR Rate 82, QT 382 - Follow up with PCP with any chest discomfort.

## 2015-09-04 NOTE — Patient Instructions (Signed)
It was great meeting you today!  Your EKG in the office today looks normal. I do not think you have any cardiac problems.   Please follow up with your PCP if you develop any chest pain or shortness of breath

## 2015-09-04 NOTE — Progress Notes (Signed)
Pre visit review using our clinic review tool, if applicable. No additional management support is needed unless otherwise documented below in the visit note. 

## 2015-09-04 NOTE — Telephone Encounter (Signed)
Patient Name: Deborah Ortega  DOB: 1966-05-04    Initial Comment Caller states her Dermatologist did a EKG, and was told her doctor needed to have it looked at. She was going to be in a Botox Study.   Nurse Assessment  Nurse: Raphael Gibney, RN, Vanita Ingles Date/Time (Eastern Time): 09/04/2015 12:18:29 PM  Confirm and document reason for call. If symptomatic, describe symptoms. You must click the next button to save text entered. ---Caller states she had an EKG at her dermatologist and she had to have the EKG prior to participating in a botox study. Dermatologist is concerned about the results of EKG and it was done a week ago. Pt is not having any symptoms. No dizziness, chest pain or SOB. Dermatologist called her and said she needed to go to her doctor to have it looked at today.  Has the patient traveled out of the country within the last 30 days? ---No  Does the patient have any new or worsening symptoms? ---No  Please document clinical information provided and list any resource used. ---Advised caller to have copy of EKG emailed to her and they possibly may do another EKG in the office for comparison and to call back for chest pain, dizziness, SOB. Verbalized understanding.     Guidelines    Guideline Title Affirmed Question Affirmed Notes       Final Disposition User   Clinical Call Raphael Gibney, RN, Vera    Comments  Appt made at the [facility] office with Antony Haste at 3 pm on 09/04/15.

## 2015-09-09 ENCOUNTER — Telehealth: Payer: Self-pay | Admitting: Allergy and Immunology

## 2015-09-09 ENCOUNTER — Other Ambulatory Visit: Payer: Self-pay

## 2015-09-09 MED ORDER — MOMETASONE FUROATE 200 MCG/ACT IN AERO: 2.0000 | INHALATION_SPRAY | RESPIRATORY_TRACT | Status: DC | PRN

## 2015-09-09 NOTE — Telephone Encounter (Signed)
Please advise 

## 2015-09-09 NOTE — Telephone Encounter (Signed)
Dr. Neldon Mc took pt off of inhaler.  He said to call in if she thought she needed one She is asking for an RX of Asmanex HFA  [facility] [address]

## 2015-09-09 NOTE — Telephone Encounter (Signed)
Please provide her a prescription for her Asmanex.

## 2015-09-15 DIAGNOSIS — K219 Gastro-esophageal reflux disease without esophagitis: Secondary | ICD-10-CM | POA: Insufficient documentation

## 2015-09-15 DIAGNOSIS — R053 Chronic cough: Secondary | ICD-10-CM | POA: Insufficient documentation

## 2015-09-15 DIAGNOSIS — J387 Other diseases of larynx: Secondary | ICD-10-CM | POA: Insufficient documentation

## 2015-09-15 DIAGNOSIS — J384 Edema of larynx: Secondary | ICD-10-CM | POA: Insufficient documentation

## 2015-10-11 ENCOUNTER — Ambulatory Visit (INDEPENDENT_AMBULATORY_CARE_PROVIDER_SITE_OTHER): Payer: BC Managed Care – PPO | Admitting: Internal Medicine

## 2015-10-11 VITALS — BP 110/64 | HR 16 | Temp 99.2°F | Resp 16 | Ht 63.0 in | Wt 122.0 lb

## 2015-10-11 DIAGNOSIS — J111 Influenza due to unidentified influenza virus with other respiratory manifestations: Secondary | ICD-10-CM

## 2015-10-11 MED ORDER — OSELTAMIVIR PHOSPHATE 75 MG PO CAPS: 75.0000 mg | ORAL_CAPSULE | Freq: Two times a day (BID) | ORAL | Status: DC

## 2015-10-11 MED ORDER — HYDROCODONE-HOMATROPINE 5-1.5 MG/5ML PO SYRP: 5.0000 mL | ORAL_SOLUTION | Freq: Four times a day (QID) | ORAL | Status: DC | PRN

## 2015-10-11 NOTE — Progress Notes (Signed)
Subjective:   By signing my name below, I, [name], attest that this documentation has been prepared under the direction and in the presence of [name], MD.  Electronically Signed: Thea Alken, ED Scribe. 10/11/2015. 2:06 PM.   Patient ID: Deborah Ortega, female    DOB: 05-15-1966, 50 y.o.   MRN: [id number]  HPI Chief Complaint  Patient presents with  . Generalized Body Aches    x 2 days, nausea, cough and congestion, chills and hot feeling    HPI Comments: Deborah Ortega is a 50 y.o. female who presents to the [facility] complaining of body ache and cough that began yesterday. She reports associated nasal congestion, nausea and chills. Cough does not keep her awake at night. She took tylenol yesterday. Pt reports exposure to flu at work. She has hx of GERD which occasionally causes her to wheeze, she is followed by Dr. Rowe Clack at [facility]. She had a 24 hour [place] monitor. She has an inhaler at home.   Past Medical History  Diagnosis Date  . ALLERGIC RHINITIS   . GERD   . 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 MIGRAINE HEADACHE   . RENAL CALCULUS, HX OF 06/25/2007, 02/2013  . THYROID NODULE   . HOH (hard of hearing)     both ears  . PONV (postoperative nausea and vomiting)   . Asthma     being worked up for asthma now. Chronic cough  . Anxiety   . Depression     takes lexapro  . History of kidney stones   . Anemia    Past Surgical History  Procedure Laterality Date  . Cesarean section      x's 2  . Meninogioma resection  07/25/09    pariatal area head-crainiotomy-plate  . Wisdom tooth extraction    . Breast lumpectomy with radioactive seed localization Right 11/19/2013    Procedure: BREAST LUMPECTOMY WITH RADIOACTIVE SEED LOCALIZATION;  Surgeon: Adin Hector, MD;  Location: [place];  Service: General;  Laterality: Right;   Prior to Admission medications   Medication Sig Start Date End Date Taking? Authorizing Provider   acetaminophen (TYLENOL) 500 MG tablet Take 1,000 mg by mouth every 6 (six) hours as needed for mild pain (pain). Reported on 08/19/2015   Yes Historical Provider, MD  escitalopram (LEXAPRO) 20 MG tablet Take 20 mg by mouth daily.   Yes Historical Provider, MD  famotidine (PEPCID) 20 MG tablet One at bedtime 12/26/13  Yes Tanda Rockers, MD  ferrous fumarate (HEMOCYTE - 106 MG FE) 325 (106 FE) MG TABS tablet Take 1 tablet by mouth.   Yes Historical Provider, MD  IRON PO Take by mouth.   Yes Historical Provider, MD  loratadine (CLARITIN) 10 MG tablet Take 10 mg by mouth daily. Reported on 08/19/2015   Yes Historical Provider, MD  mometasone [facility]) 220 MCG/INH inhaler Inhale 2 puffs into the lungs daily.   Yes Historical Provider, MD  Mometasone Furoate [facility]) 200 MCG/ACT AERO Inhale 2 puffs into the lungs every 4 (four) hours as needed. 09/09/15  Yes Jiles Prows, MD  montelukast (SINGULAIR) 10 MG tablet Take 1 tablet (10 mg total) by mouth at bedtime. 09/17/14  Yes Tanda Rockers, MD  Multiple Vitamin (MULTIVITAMIN) capsule Take 1 capsule by mouth daily.   Yes Historical Provider, MD  omeprazole (PRILOSEC) 40 MG capsule Take 1 capsule (40 mg total) by mouth every morning.  07/01/15  Yes Jiles Prows, MD  ranitidine (ZANTAC) 300 MG tablet Take 1 tablet (300 mg total) by mouth at bedtime. 07/01/15  Yes Jiles Prows, MD    Review of Systems  Constitutional: Positive for fever, chills and fatigue.  HENT: Positive for congestion. Negative for rhinorrhea.   Respiratory: Positive for cough. Negative for shortness of breath.    Objective:   Physical Exam  Constitutional: She is oriented to person, place, and time. She appears well-developed and well-nourished. No distress.  HENT:  Head: Normocephalic and atraumatic.  Right Ear: Hearing, tympanic membrane, external ear and ear canal normal.  Left Ear: Hearing, tympanic membrane, external ear and ear canal normal.  Nose: Nose normal.    Mouth/Throat: Uvula is midline, oropharynx is clear and moist and mucous membranes are normal. No oropharyngeal exudate or posterior oropharyngeal erythema.  Eyes: Conjunctivae and EOM are normal.  Neck: Neck supple.  2 small thyroid nodules that were palpable.(known to patient when asked)  Cardiovascular: Normal rate.   Pulmonary/Chest: Effort normal.  Musculoskeletal: Normal range of motion.  Lymphadenopathy:    She has no cervical adenopathy.  Neurological: She is alert and oriented to person, place, and time.  Skin: Skin is warm and dry.  Psychiatric: She has a normal mood and affect. Her behavior is normal.  Nursing note and vitals reviewed.   Filed Vitals:   10/11/15 1307  BP: 110/64  Pulse: 16  Temp: 99.2 F (37.3 C)  TempSrc: Oral  Resp: 16  Height: 5\' 3"  (1.6 m)  Weight: 122 lb (55.339 kg)  SpO2: 97%   Assessment & Plan:   Influenza with respiratory manifestation   Meds ordered this encounter  Medications  . oseltamivir (TAMIFLU) 75 MG capsule    Sig: Take 1 capsule (75 mg total) by mouth 2 (two) times daily.    Dispense:  10 capsule    Refill:  0  . HYDROcodone-homatropine (HYCODAN) 5-1.5 MG/5ML syrup    Sig: Take 5 mLs by mouth every 6 (six) hours as needed.    Dispense:  120 mL    Refill:  0    I have completed the patient encounter in its entirety as documented by the scribe, with editing by me where necessary. Deborah Ortega Pastor, M.D.

## 2015-11-27 ENCOUNTER — Other Ambulatory Visit: Payer: Self-pay

## 2015-11-27 DIAGNOSIS — Z1231 Encounter for screening mammogram for malignant neoplasm of breast: Secondary | ICD-10-CM

## 2015-12-15 ENCOUNTER — Encounter: Payer: Self-pay | Admitting: Internal Medicine

## 2015-12-15 ENCOUNTER — Ambulatory Visit (INDEPENDENT_AMBULATORY_CARE_PROVIDER_SITE_OTHER): Payer: BC Managed Care – PPO | Admitting: Internal Medicine

## 2015-12-15 VITALS — BP 110/60 | HR 89 | Temp 98.3°F | Resp 12 | Ht 63.5 in | Wt 124.1 lb

## 2015-12-15 DIAGNOSIS — Z0000 Encounter for general adult medical examination without abnormal findings: Secondary | ICD-10-CM

## 2015-12-15 DIAGNOSIS — E041 Nontoxic single thyroid nodule: Secondary | ICD-10-CM | POA: Diagnosis not present

## 2015-12-15 NOTE — Progress Notes (Signed)
   Subjective:    Patient ID: Deborah Ortega, female    DOB: Oct 17, 1965, 50 y.o.   MRN: [id number]  HPI The patient is a 50 YO female coming in for wellness. Having a lot of problems with GERD and seeing specialist at [place]. Getting EGD and had manometry and 24 hour acid test.   PMH, [facility], social history reviewed and updated.   Review of Systems  Constitutional: Negative.   HENT: Negative.   Eyes: Negative.   Respiratory: Positive for cough. Negative for chest tightness, shortness of breath and wheezing.   Cardiovascular: Negative.   Gastrointestinal: Negative.  Negative for nausea, abdominal pain, diarrhea, constipation and abdominal distention.  Musculoskeletal: Negative.   Skin: Negative.   Neurological: Negative.   Psychiatric/Behavioral: Negative.       Objective:   Physical Exam  Constitutional: She is oriented to person, place, and time. She appears well-developed and well-nourished.  HENT:  Head: Normocephalic and atraumatic.  Eyes: EOM are normal.  Neck: Normal range of motion.  Cardiovascular: Normal rate and regular rhythm.   Pulmonary/Chest: Effort normal and breath sounds normal. No respiratory distress. She has no wheezes. She has no rales.  Abdominal: Soft. Bowel sounds are normal. She exhibits no distension. There is no tenderness. There is no rebound.  Musculoskeletal: She exhibits no edema.  Neurological: She is alert and oriented to person, place, and time.  Skin: Skin is warm and dry.  Psychiatric: She has a normal mood and affect.   Filed Vitals:   12/15/15 1534  BP: 110/60  Pulse: 89  Temp: 98.3 F (36.8 C)  TempSrc: Oral  Resp: 12  Height: 5' 3.5" (1.613 m)  Weight: 124 lb 1.9 oz (56.3 kg)  SpO2: 98%      Assessment & Plan:

## 2015-12-15 NOTE — Progress Notes (Signed)
Pre visit review using our clinic review tool, if applicable. No additional management support is needed unless otherwise documented below in the visit note. 

## 2015-12-15 NOTE — Patient Instructions (Signed)
We do not need any blood work today.   I will continue to follow the progress of your stomach but good luck with getting that taken care of.   Health Maintenance, Female Adopting a healthy lifestyle and getting preventive care can go a long way to promote health and wellness. Talk with your health care provider about what schedule of regular examinations is right for you. This is a good chance for you to check in with your provider about disease prevention and staying healthy. In between checkups, there are plenty of things you can do on your own. Experts have done a lot of research about which lifestyle changes and preventive measures are most likely to keep you healthy. Ask your health care provider for more information. WEIGHT AND DIET  Eat a healthy diet  Be sure to include plenty of vegetables, fruits, low-fat dairy products, and lean protein.  Do not eat a lot of foods high in solid fats, added sugars, or salt.  Get regular exercise. This is one of the most important things you can do for your health.  Most adults should exercise for at least 150 minutes each week. The exercise should increase your heart rate and make you sweat (moderate-intensity exercise).  Most adults should also do strengthening exercises at least twice a week. This is in addition to the moderate-intensity exercise.  Maintain a healthy weight  Body mass index (BMI) is a measurement that can be used to identify possible weight problems. It estimates body fat based on height and weight. Your health care provider can help determine your BMI and help you achieve or maintain a healthy weight.  For females 20 years of age and older:   A BMI below 18.5 is considered underweight.  A BMI of 18.5 to 24.9 is normal.  A BMI of 25 to 29.9 is considered overweight.  A BMI of 30 and above is considered obese.  Watch levels of cholesterol and blood lipids  You should start having your blood tested for lipids and  cholesterol at 48 years of age, then have this test every 5 years.  You may need to have your cholesterol levels checked more often if:  Your lipid or cholesterol levels are high.  You are older than 48 years of age.  You are at high risk for heart disease.  CANCER SCREENING   Lung Cancer  Lung cancer screening is recommended for adults 55-80 years old who are at high risk for lung cancer because of a history of smoking.  A yearly low-dose CT scan of the lungs is recommended for people who:  Currently smoke.  Have quit within the past 15 years.  Have at least a 30-pack-year history of smoking. A pack year is smoking an average of one pack of cigarettes a day for 1 year.  Yearly screening should continue until it has been 15 years since you quit.  Yearly screening should stop if you develop a health problem that would prevent you from having lung cancer treatment.  Breast Cancer  Practice breast self-awareness. This means understanding how your breasts normally appear and feel.  It also means doing regular breast self-exams. Let your health care provider know about any changes, no matter how small.  If you are in your 20s or 30s, you should have a clinical breast exam (CBE) by a health care provider every 1-3 years as part of a regular health exam.  If you are 40 or older, have a CBE every year.   Also consider having a breast X-ray (mammogram) every year.  If you have a family history of breast cancer, talk to your health care provider about genetic screening.  If you are at high risk for breast cancer, talk to your health care provider about having an MRI and a mammogram every year.  Breast cancer gene (BRCA) assessment is recommended for women who have family members with BRCA-related cancers. BRCA-related cancers include:  Breast.  Ovarian.  Tubal.  Peritoneal cancers.  Results of the assessment will determine the need for genetic counseling and BRCA1 and BRCA2  testing. Cervical Cancer Your health care provider may recommend that you be screened regularly for cancer of the pelvic organs (ovaries, uterus, and vagina). This screening involves a pelvic examination, including checking for microscopic changes to the surface of your cervix (Pap test). You may be encouraged to have this screening done every 3 years, beginning at age 21.  For women ages 30-65, health care providers may recommend pelvic exams and Pap testing every 3 years, or they may recommend the Pap and pelvic exam, combined with testing for human papilloma virus (HPV), every 5 years. Some types of HPV increase your risk of cervical cancer. Testing for HPV may also be done on women of any age with unclear Pap test results.  Other health care providers may not recommend any screening for nonpregnant women who are considered low risk for pelvic cancer and who do not have symptoms. Ask your health care provider if a screening pelvic exam is right for you.  If you have had past treatment for cervical cancer or a condition that could lead to cancer, you need Pap tests and screening for cancer for at least 20 years after your treatment. If Pap tests have been discontinued, your risk factors (such as having a new sexual partner) need to be reassessed to determine if screening should resume. Some women have medical problems that increase the chance of getting cervical cancer. In these cases, your health care provider may recommend more frequent screening and Pap tests. Colorectal Cancer  This type of cancer can be detected and often prevented.  Routine colorectal cancer screening usually begins at 48 years of age and continues through 48 years of age.  Your health care provider may recommend screening at an earlier age if you have risk factors for colon cancer.  Your health care provider may also recommend using home test kits to check for hidden blood in the stool.  A small camera at the end of a  tube can be used to examine your colon directly (sigmoidoscopy or colonoscopy). This is done to check for the earliest forms of colorectal cancer.  Routine screening usually begins at age 50.  Direct examination of the colon should be repeated every 5-10 years through 48 years of age. However, you may need to be screened more often if early forms of precancerous polyps or small growths are found. Skin Cancer  Check your skin from head to toe regularly.  Tell your health care provider about any new moles or changes in moles, especially if there is a change in a mole's shape or color.  Also tell your health care provider if you have a mole that is larger than the size of a pencil eraser.  Always use sunscreen. Apply sunscreen liberally and repeatedly throughout the day.  Protect yourself by wearing long sleeves, pants, a wide-brimmed hat, and sunglasses whenever you are outside. HEART DISEASE, DIABETES, AND HIGH BLOOD PRESSURE     High blood pressure causes heart disease and increases the risk of stroke. High blood pressure is more likely to develop in:  People who have blood pressure in the high end of the normal range (130-139/85-89 mm Hg).  People who are overweight or obese.  People who are African American.  If you are 18-39 years of age, have your blood pressure checked every 3-5 years. If you are 40 years of age or older, have your blood pressure checked every year. You should have your blood pressure measured twice--once when you are at a hospital or clinic, and once when you are not at a hospital or clinic. Record the average of the two measurements. To check your blood pressure when you are not at a hospital or clinic, you can use:  An automated blood pressure machine at a pharmacy.  A home blood pressure monitor.  If you are between 55 years and 79 years old, ask your health care provider if you should take aspirin to prevent strokes.  Have regular diabetes screenings. This  involves taking a blood sample to check your fasting blood sugar level.  If you are at a normal weight and have a low risk for diabetes, have this test once every three years after 48 years of age.  If you are overweight and have a high risk for diabetes, consider being tested at a younger age or more often. PREVENTING INFECTION  Hepatitis B  If you have a higher risk for hepatitis B, you should be screened for this virus. You are considered at high risk for hepatitis B if:  You were born in a country where hepatitis B is common. Ask your health care provider which countries are considered high risk.  Your parents were born in a high-risk country, and you have not been immunized against hepatitis B (hepatitis B vaccine).  You have HIV or AIDS.  You use needles to inject street drugs.  You live with someone who has hepatitis B.  You have had sex with someone who has hepatitis B.  You get hemodialysis treatment.  You take certain medicines for conditions, including cancer, organ transplantation, and autoimmune conditions. Hepatitis C  Blood testing is recommended for:  Everyone born from 1945 through 1965.  Anyone with known risk factors for hepatitis C. Sexually transmitted infections (STIs)  You should be screened for sexually transmitted infections (STIs) including gonorrhea and chlamydia if:  You are sexually active and are younger than 48 years of age.  You are older than 48 years of age and your health care provider tells you that you are at risk for this type of infection.  Your sexual activity has changed since you were last screened and you are at an increased risk for chlamydia or gonorrhea. Ask your health care provider if you are at risk.  If you do not have HIV, but are at risk, it may be recommended that you take a prescription medicine daily to prevent HIV infection. This is called pre-exposure prophylaxis (PrEP). You are considered at risk if:  You are  sexually active and do not regularly use condoms or know the HIV status of your partner(s).  You take drugs by injection.  You are sexually active with a partner who has HIV. Talk with your health care provider about whether you are at high risk of being infected with HIV. If you choose to begin PrEP, you should first be tested for HIV. You should then be tested every 3 months for as   long as you are taking PrEP.  PREGNANCY   If you are premenopausal and you may become pregnant, ask your health care provider about preconception counseling.  If you may become pregnant, take 400 to 800 micrograms (mcg) of folic acid every day.  If you want to prevent pregnancy, talk to your health care provider about birth control (contraception). OSTEOPOROSIS AND MENOPAUSE   Osteoporosis is a disease in which the bones lose minerals and strength with aging. This can result in serious bone fractures. Your risk for osteoporosis can be identified using a bone density scan.  If you are 27 years of age or older, or if you are at risk for osteoporosis and fractures, ask your health care provider if you should be screened.  Ask your health care provider whether you should take a calcium or vitamin D supplement to lower your risk for osteoporosis.  Menopause may have certain physical symptoms and risks.  Hormone replacement therapy may reduce some of these symptoms and risks. Talk to your health care provider about whether hormone replacement therapy is right for you.  HOME CARE INSTRUCTIONS   Schedule regular health, dental, and eye exams.  Stay current with your immunizations.   Do not use any tobacco products including cigarettes, chewing tobacco, or electronic cigarettes.  If you are pregnant, do not drink alcohol.  If you are breastfeeding, limit how much and how often you drink alcohol.  Limit alcohol intake to no more than 1 drink per day for nonpregnant women. One drink equals 12 ounces of beer, 5  ounces of wine, or 1 ounces of hard liquor.  Do not use street drugs.  Do not share needles.  Ask your health care provider for help if you need support or information about quitting drugs.  Tell your health care provider if you often feel depressed.  Tell your health care provider if you have ever been abused or do not feel safe at home.   This information is not intended to replace advice given to you by your health care provider. Make sure you discuss any questions you have with your health care provider.   Document Released: 02/15/2011 Document Revised: 08/23/2014 Document Reviewed: 07/04/2013 Elsevier Interactive Patient Education [employer].

## 2015-12-15 NOTE — Assessment & Plan Note (Signed)
Recent labs at Ob/Gyn and up to date on immunizations. Getting EGD tomorrow and will continue to follow with that. Gets yearly skin exam and due for that soon. Counseled on the sun safety and mole surveillance. Given screening recommendations.

## 2015-12-15 NOTE — Assessment & Plan Note (Signed)
Followed yearly with endocrinology.

## 2015-12-24 ENCOUNTER — Ambulatory Visit
Admission: RE | Admit: 2015-12-24 | Discharge: 2015-12-24 | Disposition: A | Discharge status: Home / Self Care | Payer: BC Managed Care – PPO | Source: Ambulatory Visit

## 2015-12-24 DIAGNOSIS — Z1231 Encounter for screening mammogram for malignant neoplasm of breast: Secondary | ICD-10-CM

## 2016-01-05 ENCOUNTER — Encounter: Payer: Self-pay | Admitting: Endocrinology

## 2016-01-05 ENCOUNTER — Other Ambulatory Visit (HOSPITAL_COMMUNITY)
Admission: RE | Admit: 2016-01-05 | Discharge: 2016-01-05 | Disposition: A | Discharge status: Home / Self Care | Payer: BC Managed Care – PPO | Source: Ambulatory Visit | Attending: Endocrinology | Admitting: Endocrinology

## 2016-01-05 ENCOUNTER — Ambulatory Visit (INDEPENDENT_AMBULATORY_CARE_PROVIDER_SITE_OTHER): Payer: BC Managed Care – PPO | Admitting: Endocrinology

## 2016-01-05 VITALS — BP 108/62 | HR 80 | Temp 98.8°F | Ht 64.0 in | Wt 123.0 lb

## 2016-01-05 DIAGNOSIS — E041 Nontoxic single thyroid nodule: Secondary | ICD-10-CM

## 2016-01-05 NOTE — Progress Notes (Signed)
Subjective:    Patient ID: Deborah Ortega, female    DOB: 01-13-1966, 50 y.o.   MRN: [id number]  HPI Pt returns for f/u of left thyroid nodule (dx'ed 2003, when she had benign bx).  She does not notice the nodule.  pt states she feels well in general.  She has a slight cough sensation in the chest, but no assoc neck pain Past Medical History  Diagnosis Date  . ALLERGIC RHINITIS   . GERD   . 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 MIGRAINE HEADACHE   . RENAL CALCULUS, HX OF 06/25/2007, 02/2013  . THYROID NODULE   . HOH (hard of hearing)     both ears  . PONV (postoperative nausea and vomiting)   . Asthma     being worked up for asthma now. Chronic cough  . Anxiety   . Depression     takes lexapro  . History of kidney stones   . Anemia     Past Surgical History  Procedure Laterality Date  . Cesarean section      x's 2  . Meninogioma resection  07/25/09    pariatal area head-crainiotomy-plate  . Wisdom tooth extraction    . Breast lumpectomy with radioactive seed localization Right 11/19/2013    Procedure: BREAST LUMPECTOMY WITH RADIOACTIVE SEED LOCALIZATION;  Surgeon: Adin Hector, MD;  Location: [place];  Service: General;  Laterality: Right;    Social History   Social History  . Marital Status: Married    Spouse Name: N/A  . Number of Children: N/A  . Years of Education: N/A   Occupational History  . Not on file.   Social History Main Topics  . Smoking status: Never Smoker   . Smokeless tobacco: Never Used     Comment: Married, lives with spouse and 2 kids. Pt is a Pharmacist, hospital  . Alcohol Use: No  . Drug Use: No  . Sexual Activity: Not on file   Other Topics Concern  . Not on file   Social History Narrative    Current Outpatient Prescriptions on File Prior to Visit  Medication Sig Dispense Refill  . acetaminophen (TYLENOL) 500 MG tablet Take 1,000 mg by mouth every 6 (six) hours as needed for mild pain (pain). Reported on 08/19/2015    .  escitalopram (LEXAPRO) 20 MG tablet Take 20 mg by mouth daily.    . famotidine (PEPCID) 20 MG tablet One at bedtime 30 tablet 11  . ferrous fumarate (HEMOCYTE - 106 MG FE) 325 (106 FE) MG TABS tablet Take 1 tablet by mouth.    . loratadine (CLARITIN) 10 MG tablet Take 10 mg by mouth daily. Reported on 08/19/2015    . Mometasone Furoate (ASMANEX HFA) 200 MCG/ACT AERO Inhale 2 puffs into the lungs every 4 (four) hours as needed. 13 g 1  . Multiple Vitamin (MULTIVITAMIN) capsule Take 1 capsule by mouth daily.    . nortriptyline (PAMELOR) 25 MG capsule Take 25 mg by mouth 2 (two) times daily.    Marland Kitchen omeprazole (PRILOSEC) 40 MG capsule Take 1 capsule (40 mg total) by mouth every morning. 30 capsule 5  . ranitidine (ZANTAC) 300 MG tablet Take 1 tablet (300 mg total) by mouth at bedtime. 30 tablet 5   No current facility-administered medications on file prior to visit.    Allergies  Allergen Reactions  . Ibuprofen Rash  . Buprenorphine Hcl Itching  . Levofloxacin     Cannot  sleep  . Morphine And Related Itching  . Tramadol     Extreme sleeplessness/vomiting- can tolerate small dose per pt  . Adhesive [Tape] Rash  . Aleve [Naproxen Sodium] Rash  . Latex Rash  . Phenylephrine-Dm Anxiety    Family History  Problem Relation Age of Onset  . Diabetes Other   . Hyperlipidemia Other   . Stroke Other   . Ulcerative colitis Other   . Asthma Cousin     BP 108/62 mmHg  Pulse 80  Temp(Src) 98.8 F (37.1 C) (Oral)  Ht 5\' 4"  (1.626 m)  Wt 123 lb (55.792 kg)  BMI 21.10 kg/m2  SpO2 99%  LMP 12/06/2015  Review of Systems No weight change.  GERD is well-controlled    Objective:   Physical Exam VITAL SIGNS:  See vs page GENERAL: no distress NECK: A 2 cm left thyroid nodule is noted.  It is freely mobile.  No lymphadenopathy.    Lab Results  Component Value Date   TSH 0.89 02/12/2015   thyroid needle bx: consent obtained, signed form on chart The area is first sprayed with cooling  agent local: xylocaine 2%, with epinephrine prep: alcohol pad 4 bxs are done with 25 and [id number] needles no complications    Assessment & Plan:  Thyroid nodule: slow but steady growth over a period of years  Patient is advised the following: Patient Instructions  Please sign a release of information for the blood test done by Dr Radene Knee. If the thyroid is not there, please come back here to do. We'll let you know about the biopsy results. If as expected, no cancer is found, then please return in 1 year.

## 2016-01-05 NOTE — Patient Instructions (Addendum)
Please sign a release of information for the blood test done by Dr Radene Knee. If the thyroid is not there, please come back here to do. We'll let you know about the biopsy results. If as expected, no cancer is found, then please return in 1 year.

## 2016-01-09 ENCOUNTER — Telehealth: Payer: Self-pay | Admitting: Endocrinology

## 2016-01-09 NOTE — Telephone Encounter (Signed)
i sent out to you on mychart: No cancer is seen--good

## 2016-01-09 NOTE — Telephone Encounter (Signed)
PT calling to find out her biopsy results. Requests call back

## 2016-01-09 NOTE — Telephone Encounter (Signed)
Ms. Coykendall notified as instructed by telephone.

## 2016-01-22 ENCOUNTER — Telehealth: Payer: Self-pay | Admitting: Endocrinology

## 2016-01-22 NOTE — Telephone Encounter (Signed)
please call patient: We received results from Dr Radene Knee: Thyroid is normal--good

## 2016-01-22 NOTE — Telephone Encounter (Signed)
Attempted to reach the pt. Pt was unavailable and mail box was full. Will try again at a later time.

## 2016-01-23 NOTE — Telephone Encounter (Signed)
I contacted the pt and advised of note below. Pt voiced understanding.  

## 2016-05-24 ENCOUNTER — Ambulatory Visit (INDEPENDENT_AMBULATORY_CARE_PROVIDER_SITE_OTHER): Payer: BC Managed Care – PPO | Admitting: Family Medicine

## 2016-05-24 ENCOUNTER — Encounter: Payer: Self-pay | Admitting: Family Medicine

## 2016-05-24 VITALS — BP 98/64 | HR 64 | Temp 98.3°F | Ht 64.0 in | Wt 123.6 lb

## 2016-05-24 DIAGNOSIS — H6591 Unspecified nonsuppurative otitis media, right ear: Secondary | ICD-10-CM | POA: Diagnosis not present

## 2016-05-24 DIAGNOSIS — H6121 Impacted cerumen, right ear: Secondary | ICD-10-CM

## 2016-05-24 DIAGNOSIS — H9201 Otalgia, right ear: Secondary | ICD-10-CM

## 2016-05-24 DIAGNOSIS — L299 Pruritus, unspecified: Secondary | ICD-10-CM

## 2016-05-24 MED ORDER — AMOXICILLIN 500 MG PO CAPS: 500.0000 mg | ORAL_CAPSULE | Freq: Three times a day (TID) | ORAL | 0 refills | Status: DC

## 2016-05-24 MED ORDER — ACETIC ACID 2 % OT SOLN: 4.0000 [drp] | Freq: Three times a day (TID) | OTIC | 0 refills | Status: DC

## 2016-05-24 NOTE — Progress Notes (Addendum)
Subjective:    Patient ID: Deborah Ortega, female    DOB: 1965-12-31, 50 y.o.   MRN: [id number]  HPI  Deborah Ortega is a 50 year old female who presents today right ear discomfort and "itching" for 1 day. Right ear pain is noted as throbbing but notes that "itching" is her main concern.  Associated right sided sore throat, and congestion. Cough is not present today. She denies fever, chills, sweats, chest pain, palpitations, post nasal drip, sinus pressure, sneezing,  or tinnitus. She has been referred to an allergist by her GI provider who is treating her for reflux and associated asthma type symptoms that are related to reflux.    Claritin for symptoms today has provided limited benefit.  Review of Systems  Constitutional: Negative for chills and fever.  HENT: Positive for congestion, ear pain and sore throat. Negative for postnasal drip, rhinorrhea, sinus pressure, sneezing and tinnitus.   Eyes: Negative for itching and visual disturbance.  Respiratory: Negative for cough, shortness of breath and wheezing.   Cardiovascular: Negative for chest pain and palpitations.  Gastrointestinal: Negative for abdominal pain, constipation, diarrhea, nausea and vomiting.  Musculoskeletal: Negative for arthralgias and myalgias.  Skin: Negative for rash.  Neurological: Negative for dizziness, weakness, light-headedness, numbness and headaches.   Past Medical History:  Diagnosis Date  . ALLERGIC RHINITIS   . Anemia   . Anxiety   . Asthma    being worked up for asthma now. Chronic cough  . Depression    takes lexapro  . GERD   . History of kidney stones   . HOH (hard of hearing)    both ears  . 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 MIGRAINE HEADACHE   . PONV (postoperative nausea and vomiting)   . RENAL CALCULUS, HX OF 06/25/2007, 02/2013  . THYROID NODULE      Social History   Social History  . Marital status: Married    Spouse name: N/A  . Number of children: N/A  . Years of  education: N/A   Occupational History  . Not on file.   Social History Main Topics  . Smoking status: Never Smoker  . Smokeless tobacco: Never Used     Comment: Married, lives with spouse and 2 kids. Pt is a Pharmacist, hospital  . Alcohol use No  . Drug use: No  . Sexual activity: Not on file   Other Topics Concern  . Not on file   Social History Narrative  . No narrative on file    Past Surgical History:  Procedure Laterality Date  . BREAST LUMPECTOMY WITH RADIOACTIVE SEED LOCALIZATION Right 11/19/2013   Procedure: BREAST LUMPECTOMY WITH RADIOACTIVE SEED LOCALIZATION;  Surgeon: Adin Hector, MD;  Location: [facility];  Service: General;  Laterality: Right;  . CESAREAN SECTION     x's 2  . Meninogioma Resection  07/25/09   pariatal area head-crainiotomy-plate  . WISDOM TOOTH EXTRACTION      Family History  Problem Relation Age of Onset  . Diabetes Other   . Hyperlipidemia Other   . Stroke Other   . Ulcerative colitis Other   . Asthma Cousin     Allergies  Allergen Reactions  . Ibuprofen Rash  . Buprenorphine Hcl Itching  . Levofloxacin     Cannot sleep  . Morphine And Related Itching  . Tramadol     Extreme sleeplessness/vomiting- can tolerate small dose per pt  . Adhesive [Tape] Rash  . Aleve [  Naproxen Sodium] Rash  . Latex Rash  . Phenylephrine-Dm Anxiety    Current Outpatient Prescriptions on File Prior to Visit  Medication Sig Dispense Refill  . acetaminophen (TYLENOL) 500 MG tablet Take 1,000 mg by mouth every 6 (six) hours as needed for mild pain (pain). Reported on 08/19/2015    . escitalopram (LEXAPRO) 20 MG tablet Take 20 mg by mouth daily.    . famotidine (PEPCID) 20 MG tablet One at bedtime (Patient taking differently: 2 (two) times daily. One at bedtime) 30 tablet 11  . ferrous fumarate (HEMOCYTE - 106 MG FE) 325 (106 FE) MG TABS tablet Take 1 tablet by mouth.    . loratadine (CLARITIN) 10 MG tablet Take 10 mg by mouth daily. Reported on  08/19/2015    . Mometasone Furoate (ASMANEX HFA) 200 MCG/ACT AERO Inhale 2 puffs into the lungs every 4 (four) hours as needed. 13 g 1  . Multiple Vitamin (MULTIVITAMIN) capsule Take 1 capsule by mouth daily.    . nortriptyline (PAMELOR) 25 MG capsule Take 25 mg by mouth 2 (two) times daily.    Marland Kitchen omeprazole (PRILOSEC) 40 MG capsule Take 1 capsule (40 mg total) by mouth every morning. 30 capsule 5  . ranitidine (ZANTAC) 300 MG tablet Take 1 tablet (300 mg total) by mouth at bedtime. 30 tablet 5   No current facility-administered medications on file prior to visit.     BP 98/64 (BP Location: Right Arm, Patient Position: Sitting, Cuff Size: Normal)   Pulse 64   Temp 98.3 F (36.8 C) (Oral)   Ht 5\' 4"  (1.626 m)   Wt 123 lb 9.6 oz (56.1 kg)   LMP 05/05/2016 (LMP Unknown)   BMI 21.22 kg/m       Objective:   Physical Exam  Constitutional: She is oriented to person, place, and time. She appears well-developed and well-nourished.  HENT:  Right Ear: Tympanic membrane is erythematous and bulging.  Left Ear: Tympanic membrane normal.  Mouth/Throat: Mucous membranes are normal.  Post oropharyngeal erythema which is present on the right side only. No exudate present. Cerumen impaction noted in right ear only which was removed by irrigation   Eyes: Pupils are equal, round, and reactive to light. No scleral icterus.  Neck: Neck supple.  Cardiovascular: Normal rate and regular rhythm.   Pulmonary/Chest: Effort normal and breath sounds normal. She has no wheezes. She has no rales.  Lymphadenopathy:    She has cervical adenopathy.  Neurological: She is alert and oriented to person, place, and time. Coordination normal.  Skin: Skin is warm and dry. No rash noted.        Assessment & Plan:  1. Right non-suppurative otitis media Exam and symptoms support treatment for AOM. Advised patient to follow up if symptoms do not improve in 3 to 4 days with treatment, worsen, or she develops a fever  >100. - amoxicillin (AMOXIL) 500 MG capsule; Take 1 capsule (500 mg total) by mouth 3 (three) times daily.  Dispense: 30 capsule; Refill: 0  2. Right ear pain She has been prescribed acetaminophen previously by another provided  and will use this if needed. She is not using acetaminophen at this time.  3. Ear itching Right ear irrigated to remove cerumen due to impaction. Treat with Vosol for symptoms.  - acetic acid (VOSOL) 2 % otic solution; Place 4 drops into the right ear 3 (three) times daily.  Dispense: 15 mL; Refill: 0  Delano Metz, FNP-C

## 2016-05-24 NOTE — Progress Notes (Signed)
Pre visit review using our clinic review tool, if applicable. No additional management support is needed unless otherwise documented below in the visit note. 

## 2016-05-24 NOTE — Patient Instructions (Signed)
Please take antibiotic as directed and use drops for symptom of ear itching.  If symptoms do not improve, worsen, or you develop fever >100, please follow up for further evaluation and treatment.   Otitis Media, Adult Otitis media is redness, soreness, and inflammation of the middle ear. Otitis media may be caused by allergies or, most commonly, by infection. Often it occurs as a complication of the common cold. SIGNS AND SYMPTOMS Symptoms of otitis media may include:  Earache.  Fever.  Ringing in your ear.  Headache.  Leakage of fluid from the ear. DIAGNOSIS To diagnose otitis media, your health care provider will examine your ear with an otoscope. This is an instrument that allows your health care provider to see into your ear in order to examine your eardrum. Your health care provider also will ask you questions about your symptoms. TREATMENT  Typically, otitis media resolves on its own within 3-5 days. Your health care provider may prescribe medicine to ease your symptoms of pain. If otitis media does not resolve within 5 days or is recurrent, your health care provider may prescribe antibiotic medicines if he or she suspects that a bacterial infection is the cause. HOME CARE INSTRUCTIONS   If you were prescribed an antibiotic medicine, finish it all even if you start to feel better.  Take medicines only as directed by your health care provider.  Keep all follow-up visits as directed by your health care provider. SEEK MEDICAL CARE IF:  You have otitis media only in one ear, or bleeding from your nose, or both.  You notice a lump on your neck.  You are not getting better in 3-5 days.  You feel worse instead of better. SEEK IMMEDIATE MEDICAL CARE IF:   You have pain that is not controlled with medicine.  You have swelling, redness, or pain around your ear or stiffness in your neck.  You notice that part of your face is paralyzed.  You notice that the bone behind your ear  (mastoid) is tender when you touch it. MAKE SURE YOU:   Understand these instructions.  Will watch your condition.  Will get help right away if you are not doing well or get worse.   This information is not intended to replace advice given to you by your health care provider. Make sure you discuss any questions you have with your health care provider.   Document Released: 05/07/2004 Document Revised: 08/23/2014 Document Reviewed: 02/27/2013 Elsevier Interactive Patient Education [employer].

## 2016-06-03 NOTE — Progress Notes (Signed)
Cerumen irrigation noted due to impaction.

## 2016-06-03 NOTE — Progress Notes (Signed)
Cerumen irrigation noted.

## 2016-07-05 ENCOUNTER — Telehealth: Payer: Self-pay | Admitting: Emergency Medicine

## 2016-07-05 NOTE — Telephone Encounter (Signed)
noted 

## 2016-07-05 NOTE — Telephone Encounter (Signed)
Pt is participating in a clinical trial for lip sealer. Just a [place]

## 2016-09-07 ENCOUNTER — Other Ambulatory Visit: Payer: Self-pay | Admitting: Obstetrics and Gynecology

## 2016-09-07 DIAGNOSIS — Z1231 Encounter for screening mammogram for malignant neoplasm of breast: Secondary | ICD-10-CM

## 2016-10-13 ENCOUNTER — Ambulatory Visit (INDEPENDENT_AMBULATORY_CARE_PROVIDER_SITE_OTHER): Payer: BC Managed Care – PPO | Admitting: Adult Health

## 2016-10-13 ENCOUNTER — Encounter: Payer: Self-pay | Admitting: Adult Health

## 2016-10-13 VITALS — BP 110/80 | Temp 98.8°F | Wt 127.8 lb

## 2016-10-13 DIAGNOSIS — J069 Acute upper respiratory infection, unspecified: Secondary | ICD-10-CM | POA: Diagnosis not present

## 2016-10-13 LAB — POCT INFLUENZA A/B
Influenza A, POC: NEGATIVE
Influenza B, POC: NEGATIVE

## 2016-10-13 MED ORDER — PREDNISONE 10 MG PO TABS: ORAL_TABLET | ORAL | 0 refills | Status: DC

## 2016-10-13 MED ORDER — DOXYCYCLINE HYCLATE 100 MG PO CAPS: 100.0000 mg | ORAL_CAPSULE | Freq: Two times a day (BID) | ORAL | 0 refills | Status: DC

## 2016-10-13 MED ORDER — HYDROCODONE-HOMATROPINE 5-1.5 MG/5ML PO SYRP: 5.0000 mL | ORAL_SOLUTION | Freq: Four times a day (QID) | ORAL | 0 refills | Status: DC | PRN

## 2016-10-13 NOTE — Progress Notes (Signed)
Subjective:    Patient ID: Deborah Ortega, female    DOB: December 05, 1965, 51 y.o.   MRN: [id number]  HPI  51 year old female, patient of Dr. Sharlet Salina, who I am seeing today for the first time. Her complaint is that of 4 days of sinus pain and pressure, nasal  congestion,  Non productive cough, body aches, and fatigue. She does report low grade fevers.   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     Review of Systems Negative unless mentioned above in HPI   Past Medical History:  Diagnosis Date  . ALLERGIC RHINITIS   . Anemia   . Anxiety   . Asthma    being worked up for asthma now. Chronic cough  . Depression    takes lexapro  . GERD   . History of kidney stones   . HOH (hard of hearing)    both ears  . 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 MIGRAINE HEADACHE   . PONV (postoperative nausea and vomiting)   . RENAL CALCULUS, HX OF 06/25/2007, 02/2013  . THYROID NODULE     Social History   Social History  . Marital status: Married    Spouse name: N/A  . Number of children: N/A  . Years of education: N/A   Occupational History  . Not on file.   Social History Main Topics  . Smoking status: Never Smoker  . Smokeless tobacco: Never Used     Comment: Married, lives with spouse and 2 kids. Pt is a Pharmacist, hospital  . Alcohol use No  . Drug use: No  . Sexual activity: Not on file   Other Topics Concern  . Not on file   Social History Narrative  . No narrative on file    Past Surgical History:  Procedure Laterality Date  . BREAST LUMPECTOMY WITH RADIOACTIVE SEED LOCALIZATION Right 11/19/2013   Procedure: BREAST LUMPECTOMY WITH RADIOACTIVE SEED LOCALIZATION;  Surgeon: Adin Hector, MD;  Location: [facility];  Service: General;  Laterality: Right;  . CESAREAN SECTION     x's 2  . Meninogioma Resection  07/25/09   pariatal area head-crainiotomy-plate  . WISDOM TOOTH EXTRACTION      Family History  Problem  Relation Age of Onset  . Diabetes Other   . Hyperlipidemia Other   . Stroke Other   . Ulcerative colitis Other   . Asthma Cousin     Allergies  Allergen Reactions  . Ibuprofen Rash  . Buprenorphine Hcl Itching  . Levofloxacin     Cannot sleep  . Morphine And Related Itching  . Tramadol     Extreme sleeplessness/vomiting- can tolerate small dose per pt  . Adhesive [Tape] Rash  . Aleve [Naproxen Sodium] Rash  . Latex Rash  . Phenylephrine-Dm Anxiety    Current Outpatient Prescriptions on File Prior to Visit  Medication Sig Dispense Refill  . acetaminophen (TYLENOL) 500 MG tablet Take 1,000 mg by mouth every 6 (six) hours as needed for mild pain (pain). Reported on 08/19/2015    . acetic acid (VOSOL) 2 % otic solution Place 4 drops into the right ear 3 (three) times daily. 15 mL 0  . escitalopram (LEXAPRO) 20 MG tablet Take 20 mg by mouth daily.    . famotidine (PEPCID) 20 MG tablet One at bedtime (Patient taking differently: 2 (two) times daily. One at bedtime) 30 tablet 11  .  ferrous fumarate (HEMOCYTE - 106 MG FE) 325 (106 FE) MG TABS tablet Take 1 tablet by mouth.    . loratadine (CLARITIN) 10 MG tablet Take 10 mg by mouth daily. Reported on 08/19/2015    . Mometasone Furoate (ASMANEX HFA) 200 MCG/ACT AERO Inhale 2 puffs into the lungs every 4 (four) hours as needed. 13 g 1  . Multiple Vitamin (MULTIVITAMIN) capsule Take 1 capsule by mouth daily.    . nortriptyline (PAMELOR) 25 MG capsule Take 25 mg by mouth 2 (two) times daily.    Marland Kitchen omeprazole (PRILOSEC) 40 MG capsule Take 1 capsule (40 mg total) by mouth every morning. 30 capsule 5  . ranitidine (ZANTAC) 300 MG tablet Take 1 tablet (300 mg total) by mouth at bedtime. 30 tablet 5   No current facility-administered medications on file prior to visit.     BP 110/80   Temp 98.8 F (37.1 C) (Oral)   Wt 127 lb 12.8 oz (58 kg)   BMI 21.94 kg/m       Objective:   Physical Exam  Constitutional: She is oriented to person,  place, and time. Vital signs are normal. She appears well-developed and well-nourished. She appears ill. No distress.  HENT:  Head: Normocephalic and atraumatic.  Right Ear: Hearing, tympanic membrane, external ear and ear canal normal.  Left Ear: Hearing, tympanic membrane, external ear and ear canal normal.  Nose: Mucosal edema and rhinorrhea present. Right sinus exhibits no maxillary sinus tenderness and no frontal sinus tenderness. Left sinus exhibits no maxillary sinus tenderness and no frontal sinus tenderness.  Mouth/Throat: Uvula is midline, oropharynx is clear and moist and mucous membranes are normal. No oropharyngeal exudate.  Eyes: Conjunctivae and EOM are normal. Pupils are equal, round, and reactive to light. Right eye exhibits no discharge. Left eye exhibits no discharge. No scleral icterus.  Neck: Normal range of motion. Neck supple. No thyromegaly present.  Cardiovascular: Normal rate, regular rhythm, normal heart sounds and intact distal pulses.  Exam reveals no gallop and no friction rub.   No murmur heard. Pulmonary/Chest: Effort normal and breath sounds normal. No respiratory distress. She has no wheezes. She has no rales. She exhibits no tenderness.  Lymphadenopathy:    She has no cervical adenopathy.  Neurological: She is alert and oriented to person, place, and time.  Skin: Skin is warm and dry. No rash noted. She is not diaphoretic. No erythema. No pallor.  Psychiatric: She has a normal mood and affect. Her behavior is normal. Judgment and thought content normal.  Nursing note and vitals reviewed.     Assessment & Plan:  1. Upper respiratory tract infection, unspecified type - POC Influenza A/B- Negative  - doxycycline (VIBRAMYCIN) 100 MG capsule; Take 1 capsule (100 mg total) by mouth 2 (two) times daily.  Dispense: 14 capsule; Refill: 0 - predniSONE (DELTASONE) 10 MG tablet; 40 mg x 3 days, 20 mg x 3 days, 10 mg x 3 days  Dispense: 21 tablet; Refill: 0 -  HYDROcodone-homatropine (HYCODAN) 5-1.5 MG/5ML syrup; Take 5 mLs by mouth every 6 (six) hours as needed for cough.  Dispense: 120 mL; Refill: 0 - Stay hydrated and rest  - Follow up if no improvement   Dorothyann Peng, NP

## 2016-10-28 IMAGING — CR DG CHEST 2V
2 series · 2 of 2 positions shown · non-contrast
Comparison: Chest x-ray 08/14/2013.

CLINICAL DATA: 48-year-old female with history of chronic cough.

EXAM:
CHEST  2 VIEW

[view not recorded (1 of 2)]
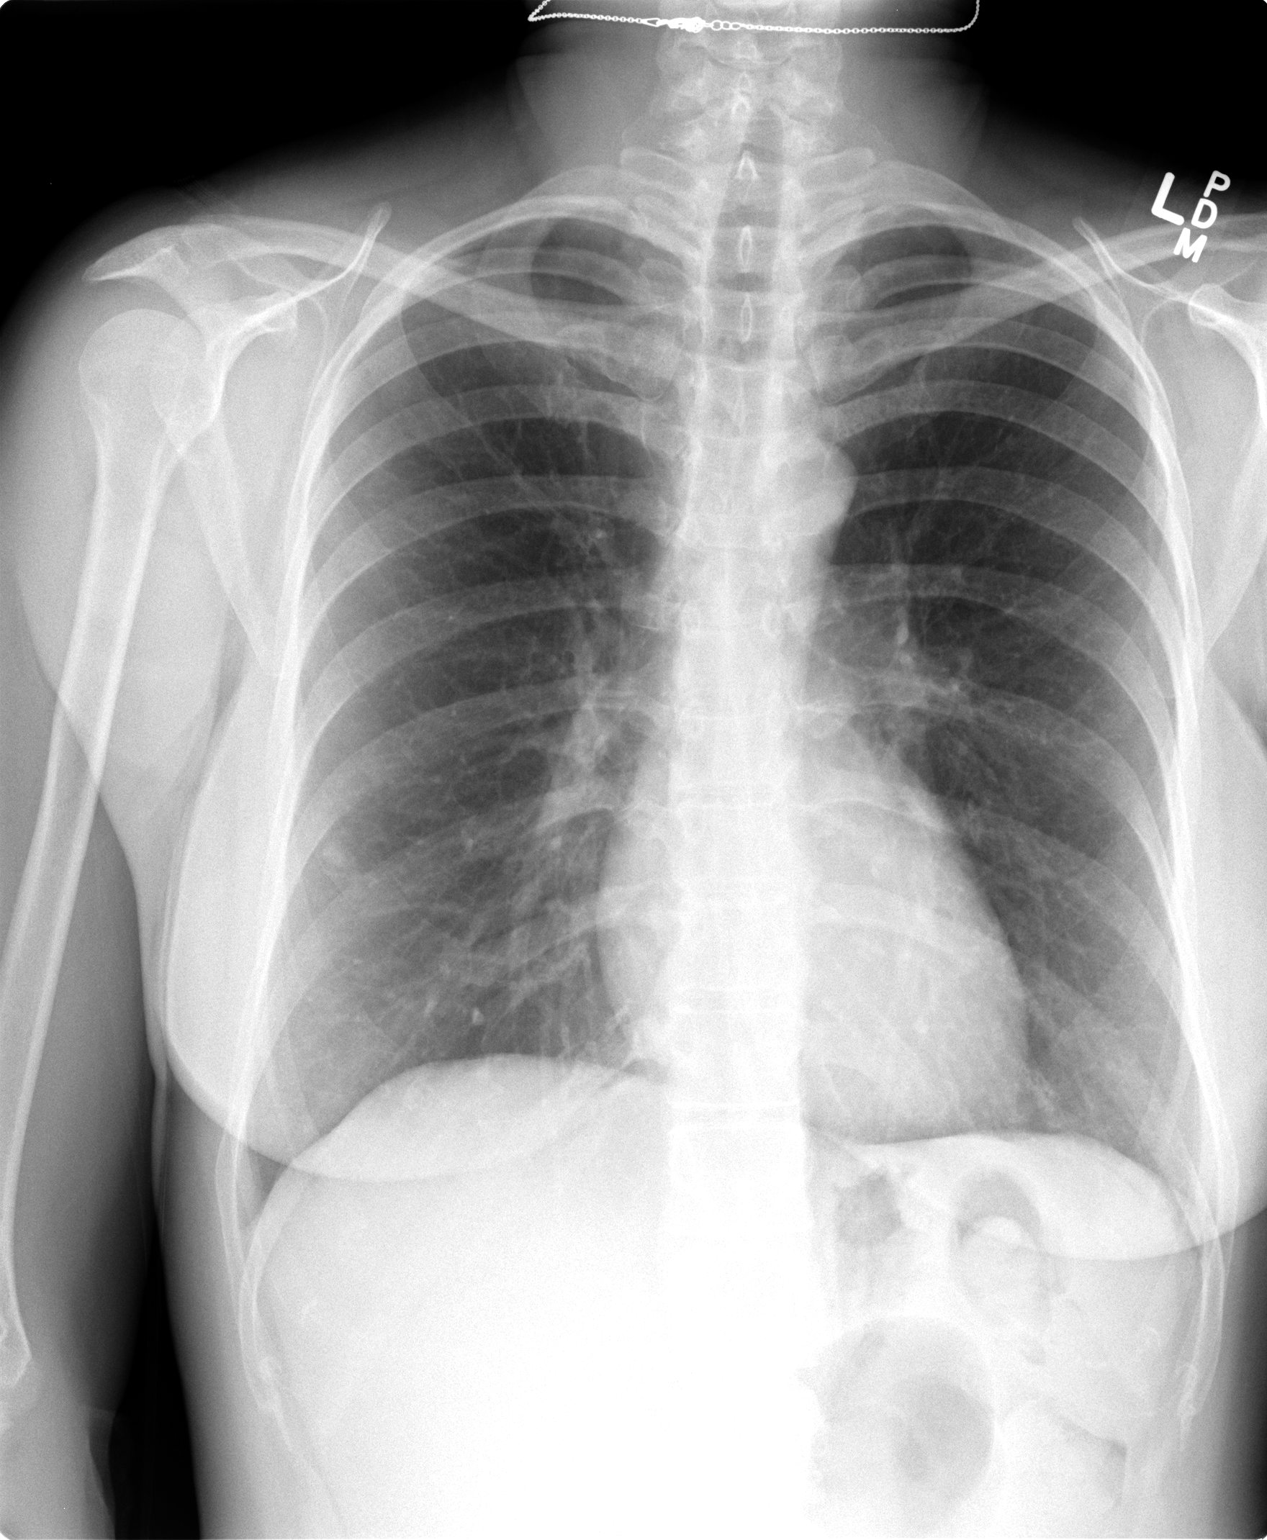

[view not recorded (2 of 2)]
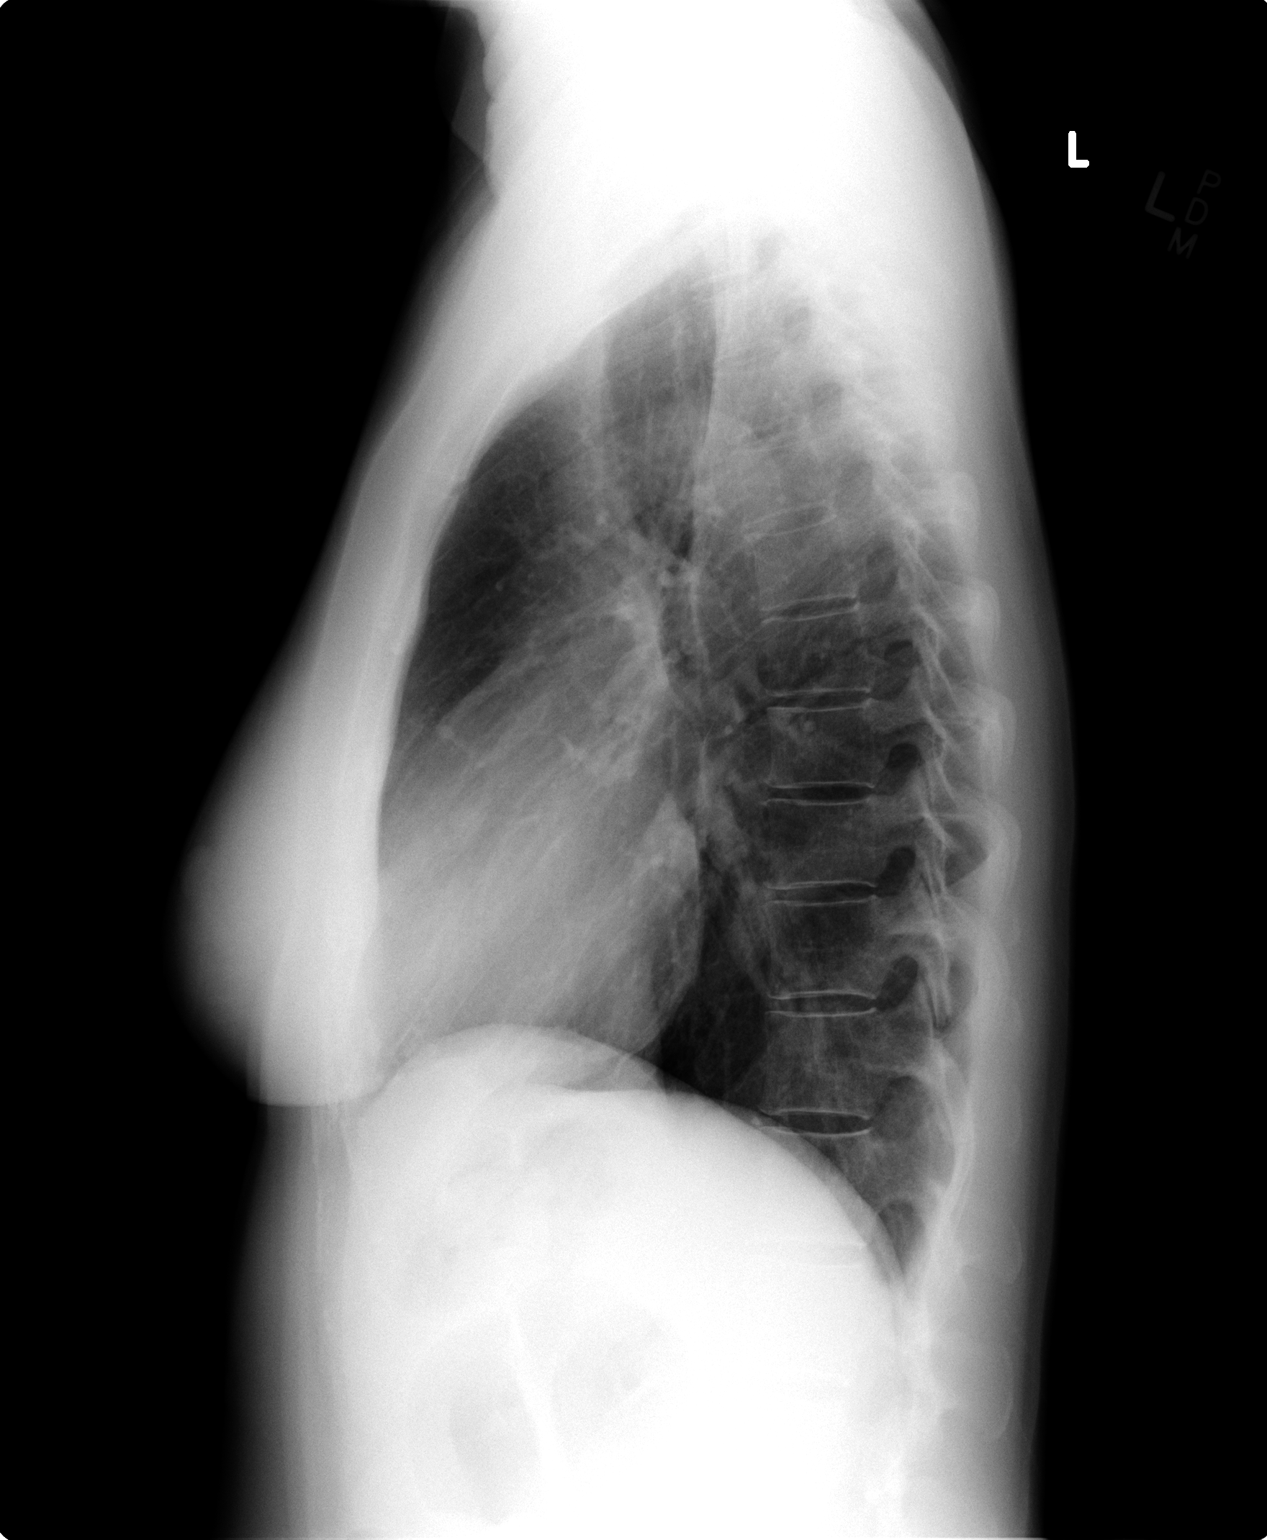

[2 of 2 positions shown; findings below may reference images not displayed]

FINDINGS: Lung volumes are normal. No consolidative airspace disease. No
pleural effusions. No pneumothorax. No pulmonary nodule or mass
noted. Pulmonary vasculature and the cardiomediastinal silhouette
are within normal limits. Small sclerotic lesion in the
antrerolateral aspect of the right fifth rib is similar to prior
studies, presumably a bone island.
IMPRESSION: No radiographic evidence of acute cardiopulmonary disease.

## 2016-11-30 LAB — LIPID PANEL
CHOLESTEROL: 206 — AB (ref 0–200)
HDL: 46 (ref 35–70)
LDL CALC: 146
Triglycerides: 72 (ref 40–160)

## 2016-11-30 LAB — VITAMIN D 25 HYDROXY (VIT D DEFICIENCY, FRACTURES): VIT D 25 HYDROXY: 20

## 2016-12-16 ENCOUNTER — Encounter: Payer: Self-pay | Admitting: Internal Medicine

## 2016-12-16 ENCOUNTER — Ambulatory Visit (INDEPENDENT_AMBULATORY_CARE_PROVIDER_SITE_OTHER): Payer: BC Managed Care – PPO | Admitting: Internal Medicine

## 2016-12-16 VITALS — BP 124/70 | HR 73 | Resp 14 | Ht 63.0 in | Wt 126.4 lb

## 2016-12-16 DIAGNOSIS — R058 Other specified cough: Secondary | ICD-10-CM

## 2016-12-16 DIAGNOSIS — R05 Cough: Secondary | ICD-10-CM | POA: Diagnosis not present

## 2016-12-16 DIAGNOSIS — J45991 Cough variant asthma: Secondary | ICD-10-CM | POA: Diagnosis not present

## 2016-12-16 MED ORDER — PREDNISONE 10 MG PO TABS: ORAL_TABLET | ORAL | 0 refills | Status: DC

## 2016-12-16 MED ORDER — GABAPENTIN 100 MG PO CAPS: 100.0000 mg | ORAL_CAPSULE | Freq: Three times a day (TID) | ORAL | 2 refills | Status: DC

## 2016-12-16 NOTE — Patient Instructions (Signed)
Gabapentin 100 mg build up to three time daily with meals in addition to the 300 mg at bedtime   Prednisone 10 mg take  4 each am x 2 days,   2 each am x 2 days,  1 each am x 2 days and stop   Stay on acid suppression   GERD (REFLUX)  is an extremely common cause of respiratory symptoms just like yours , many times with no obvious heartburn at all.    It can be treated with medication, but also with lifestyle changes including elevation of the head of your bed (ideally with 6 inch  bed blocks),  Smoking cessation, avoidance of late meals, excessive alcohol, and avoid fatty foods, chocolate, peppermint, colas, red wine, and acidic juices such as orange juice.  NO MINT OR MENTHOL PRODUCTS SO NO COUGH DROPS   USE SUGARLESS CANDY INSTEAD (Jolley ranchers or Stover's or Life Savers) or even ice chips will also do - the key is to swallow to prevent all throat clearing. NO OIL BASED VITAMINS - use powdered substitutes.    For drainage / throat tickle try take CHLORPHENIRAMINE  4 mg - take one every 4 hours as needed - available over the counter- may cause drowsiness so start with just a bedtime dose or two and see how you tolerate it before trying in daytime    Please schedule a follow up office visit in 4 weeks, sooner if needed  with all medications /inhalers/ solutions in hand so we can verify exactly what you are taking. This includes all medications from all doctors and over the counters

## 2016-12-16 NOTE — Progress Notes (Signed)
Subjective:   Patient ID: Deborah Ortega, female    DOB: August 11, 1966  MRN: [id number]    Brief patient profile:  39  yowf never smoker with onset  Early 2000's of sneezing and itching spring = fall not better on clariton and zyrtec never allergy eval then cough x around 2010 which is about the same time  she underwent craniotomy posteriorly for meningioma referred 12/26/2013 by Dr Basilio Cairo to pulmonary clinic.   History of Present Illness  12/26/2013 1st [facility] office visit/ Drayke Grabel  Chief Complaint  Patient presents with  . Pulmonary Consult    Referred per Dr. Sherrye Payor. Pt c/o cough x 4-5 years, seems to be gradually getting worse. Cough is triggered by eating and talking. Cough is non prod, and sometimes coughs until the point of vomiting.   sister has exact same pattern, mother has bad gerd - pt no better previously on PPI but chewing mint gum, cough worse with chocolate rec Pantoprazole (protonix) 40 mg   Take 30-60 min before first meal of the day and Pepcid 20 mg one bedtime and chloretrimeton 4 mg take 2 at bedtime  until return to office - this is the best way to tell whether stomach acid is contributing to your problem.   Prednisone 10 mg take  4 each am x 2 days,   2 each am x 2 days,  1 each am x 2 days and stop Take delsym two tsp every 12 hours and supplement if needed with  tramadol 50 mg up to 2 every 4 hours to suppress the urge to cough. Swallowing water or using ice chips/non mint and menthol containing candies (such as lifesavers or sugarless jolly ranchers) are also effective.  You should rest your voice and avoid activities that you know make you cough. Once you have eliminated the cough for 3 straight days try reducing the tramadol first   01/23/2014 f/u ov/Arless Vineyard re: cough x 4 y  Chief Complaint  Patient presents with  . Follow-up    Pt states cough has improved slightly since last visit.  She feels it was better while on prednisone. No new co's today.    never took tramadol at all / noct cough is gone / never able to eliminate daytime urge to clear throat and cough   [name]  Do you awaken from a sound sleep coughing violently?                            With trouble breathing? Resolved    Do you have choking episodes when you cannot  Get enough air, gasping for air ?              Yes, still daytime   Do you usually cough when you lie down into  The bed, or when you just lie down to rest ?                          better   Do you usually cough after meals or eating?         Yes, still    Do you cough when (or after) you bend over?    no   GERD SCORE     Kouffman Reflux v Neurogenic Cough Differentiator Neurogenic   Do you more-or-less cough all day long? Spells    Does change of temperature  make you cough? Hot worse   Does laughing or chuckling cause you to cough? sometimes   Do fumes (perfume, automobile fumes, burned  Toast, etc.,) cause you to cough ?      yes   Does speaking, singing, or talking on the phone cause you to cough   ?               yes   Neurogenic/Airway score      rec First take delsym two tsp every 12 hours and supplement if needed with  tramadol 50 mg up to 2 every 4 hours  .   Gabapentin 100 mg three times a day until return  Prednisone 10 mg take  4 each am x 2 days,   2 each am x 2 days,  1 each am x 2 days and stop (this is to eliminate allergies and inflammation from coughing) Protonix (pantoprazole) Take 30-60 min before first meal of the day and Pepcid 20 mg one bedtime plus chlorpheniramine 4 mg x 1 at bedtime (both available over the counter)  until cough is completely gone for at least a week without the need for cough suppression GERD diet    03/05/2014 f/u ov/Sherryn Pollino re: cough x 5 years Chief Complaint  Patient presents with  . Follow-up    Pt states that her cough has improved, but not resolved. No new co's today.   not able to take tramadol  and neurontin together but tessilon twice daily still having cough to point vomiting Cough more day than night  rec tessilon 200 four times day until no coughing at all x one week  Increase neurontin to four times (bfast lunch supper and bedtime ) Change the night time routine to take pepcid 20 mg and 2 x chloretrimeton one hour before bedtime  Please schedule a follow up office visit in 4 weeks, sooner if needed    03/25/2014 f/u ov/Caryn Gienger re: cough x 5 years  Chief Complaint  Patient presents with  . Acute Visit    Pt states that her cough is still not improving. She had time off of work and so tried taking the tramadol- she took 1/2 tablet and went a whole day w/o cough. She states that the next day she tried taking med again and this caused vomiting. Now she is taking 1/4 tablet and no more vomiting, but still has the cough.   not coughing at all HS using one chloretrimeton and pepcd at bedtime but can't use chortrimeton daytime ? 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  rec Prednisone 10 mg take  4 each am x 2 days,   2 each am x 2 days,  1 each am x 2 days and stop and use tramadol / phenergan prn (didn't need it)    05/08/2014 f/u ov/Lauriann Milillo re: cough x 5 years  Chief Complaint  Patient presents with  . Follow-up    Pt states that her cough is worse since she had developed a cold a wk ago. She has been zithromax and this helped somewhat.   was better  But never able to stop tessilon and then acutely ill p exp to sick son x one week prior to OV   Not clear that she's using neurontin consistently but does seem to help and does tolerate the 100 mg strength. rec rec Continue tessilon 200 mg up to every 4 hours / supplement with tramadol /phenergan if  needed as needed for nausea  Prednisone 10 mg take  4 each am x 2 days,   2 each am x 2 days,  1 each am x 2 days and stop   06/19/2014 f/u ov/Serjio Deupree re: cough x 5 y "don't remember when I wasn't  coughing  Chief Complaint  Patient presents with  . Follow-up    Pt states that her cough is unchanged. Unable to take tramadol due to her work and home schedule.   never took prednisone or tramadol in suppressive doses but with pepcid and chloretrimeton x 4 mg sleeps fine  Cough mostly provoked by voice use Not able to tolerate increased doses of neurontin s feeling groggy  rec   schedule methacholine challenge> pos for airflow obst/ neg for cough > start singulair      08/20/2014 f/u ov/Chaquana Nichols re: cough x 5 y maint on singulair and qvar (23 doses left and should be on 12)  Chief Complaint  Patient presents with  . Follow-up    Pt c/o increased cough sicne 08/08/14- waking her up at night and "seems more congested".  She states that she coughs every time she uses her inhaler.    By 12/7 no benefit from singulair so added qvar 80 2bid but not sure she inhaled it corectly and it made her cough immediately then" got sick "08/08/14 p exp to sick fm member then a lot worse cough with fever nasal congestion / sore throat ? Assoc with finishing a sulfa abx for kidney infection so didn't take abx and the fever resolved Cough also at night but also with voice use and when eating now dry. rec Prednisone 10 mg take  4 each am x 2 days,   2 each am x 2 days,  1 each am x 2 days and stop  Change to Qvar 40 Take 2 puffs first thing in am and then another 2 puffs about 12 hours later.  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 Follow-up    follow up for patient continues to have cough patient in unable to get rid of   [name]  Do you awaken from a sound sleep coughing violently?                            With trouble breathing? Once a week no   Do you have choking episodes when you cannot  Get enough air, gasping for air ?              Yes   Do you usually cough when you lie down into  The bed, or when you just lie down to rest ?  occasionally   Do you usually cough after meals or eating?         Yes esp crummy   Do you cough when (or after) you bend over?    Yes   GERD SCORE     Kouffman Reflux v Neurogenic Cough Differentiator Neurogenic   Do you more-or-less cough all day long? Sporadic    Does change of temperature make you cough? no   Does laughing or chuckling cause you to cough? Yes    Do fumes (perfume, automobile fumes, burned  Toast, etc.,) cause you to cough ?      Yes def    Does speaking, singing, or talking on the phone cause you to cough   ?               Yes    Neurogenic/Airway score       Seems like exercise makes the cough worse but otherwise no specific trigger x as above  Not limited by breathing from desired activities    No obvious day to day or daytime variability or assoc excess/ purulent sputum or mucus plugs or hemoptysis or cp or chest tightness, subjective wheeze or overt sinus or hb symptoms. No unusual exp hx or h/o childhood pna/ asthma or knowledge of premature birth.  Sleeping ok without nocturnal  or early am exacerbation  of respiratory  c/o's or need for noct saba. Also denies any obvious fluctuation of symptoms with weather or environmental changes or other aggravating or alleviating factors except as outlined above   Current Medications, Allergies, Complete Past Medical History, Past Surgical History, Family History, and Social History were reviewed in [employer] [employer] record.  ROS  The following are not active complaints unless bolded sore throat, dysphagia, dental problems, itching, sneezing,  nasal congestion or excess/ purulent secretions, ear ache,   fever, chills, sweats, unintended wt loss, classically pleuritic or exertional cp,  orthopnea pnd or leg swelling, presyncope, palpitations, abdominal pain, anorexia, nausea, vomiting, diarrhea  or change in bowel or bladder habits, change in stools or urine, dysuria,hematuria,  rash, arthralgias, visual complaints, headache, numbness, weakness or ataxia or problems with walking or coordination,  change in mood/affect or memory.                           Objective:   Physical Exam  amb wf nad    01/23/14  115 >  03/05/2014   117 > 03/25/2014  120 >  05/08/2014  120> 06/19/2014 119 >  07/16/2014  118 >  08/20/2014  116 >  12/16/2016  126  Vital signs reviewed:   - Note on arrival 02 sats  99% on RA    HEENT: nl dentition, turbinates, and oropharynx with min cobblestoning, no excess pnds   Nl external ear canals without cough reflex   NECK :  without JVD/Nodes/TM/ nl carotid upstrokes bilaterally   LUNGS: no acc muscle use, clear to A and P bilaterally with urge to cough at end exp though not reproducible    CV:  RRR  no s3 or murmur or increase in P2, no edema   ABD:  soft and nontender with nl excursion in the supine position. No bruits or organomegaly, bowel sounds nl  MS:  warm without deformities, calf tenderness, cyanosis or clubbing                Assessment & Plan:   Outpatient Encounter Prescriptions  as of 12/16/2016  Medication Sig  . escitalopram (LEXAPRO) 20 MG tablet Take 20 mg by mouth daily.  . ferrous fumarate (HEMOCYTE - 106 MG FE) 325 (106 FE) MG TABS tablet Take 1 tablet by mouth.  . Multiple Vitamin (MULTIVITAMIN) capsule Take 1 capsule by mouth daily.  Marland Kitchen omeprazole (PRILOSEC) 40 MG capsule Take 1 capsule (40 mg total) by mouth every morning.  . ranitidine  (ZANTAC) 300 MG tablet Take 1 tablet (300 mg total) by mouth at bedtime.  . [DISCONTINUED] loratadine (CLARITIN) 10 MG tablet Take 10 mg by mouth daily. Reported on 08/19/2015  . acetaminophen (TYLENOL) 500 MG tablet Take 1,000 mg by mouth every 6 (six) hours as needed for mild pain (pain). Reported on 08/19/2015  . gabapentin (NEURONTIN) 100 MG capsule Take 1 capsule (100 mg total) by mouth 3 (three) times daily. One three times daily  . nortriptyline (PAMELOR) 25 MG capsule Take 25 mg by mouth 2 (two) times daily.  . predniSONE (DELTASONE) 10 MG tablet Take  4 each am x 2 days,   2 each am x 2 days,  1 each am x 2 days and stop  . [DISCONTINUED] acetic acid (VOSOL) 2 % otic solution Place 4 drops into the right ear 3 (three) times daily. (Patient not taking: Reported on 12/16/2016)  . [DISCONTINUED] doxycycline (VIBRAMYCIN) 100 MG capsule Take 1 capsule (100 mg total) by mouth 2 (two) times daily. (Patient not taking: Reported on 12/16/2016)  . [DISCONTINUED] famotidine (PEPCID) 20 MG tablet One at bedtime (Patient not taking: Reported on 12/16/2016)  . [DISCONTINUED] HYDROcodone-homatropine (HYCODAN) 5-1.5 MG/5ML syrup Take 5 mLs by mouth every 6 (six) hours as needed for cough. (Patient not taking: Reported on 12/16/2016)  . [DISCONTINUED] Mometasone Furoate (ASMANEX HFA) 200 MCG/ACT AERO Inhale 2 puffs into the lungs every 4 (four) hours as needed. (Patient not taking: Reported on 12/16/2016)  . [DISCONTINUED] predniSONE (DELTASONE) 10 MG tablet 40 mg x 3 days, 20 mg x 3 days, 10 mg x 3 days (Patient not taking: Reported on 12/16/2016)   No facility-administered encounter medications on file as of 12/16/2016.

## 2016-12-17 NOTE — Assessment & Plan Note (Signed)
-    Cyclical cough regimen  12/26/2013  > not completed as instructed -  01/23/14  added neurontin 100 mg tid > increased to qid 03/06/2014  - 7/84/7841 repeat cyclical cough regimen with tramadol/phenergan> did not take pred with it  - Allergy profile 06/19/2014 >  Eos 5.5%   IgE 86  Min Pos RAST for dust and [place] grass  - methacholine challenge   07/04/14  > pos for asthma see cough variant asthma  - Sinus CT 07/19/2014 >  No evidence of sinusitis. - 08/20/14 changed neurontin to 300 qhs and 100 tid daytime  - returned 12/16/2016 on just 300 hs so rec restart 100 tid    Lack of cough resolution on a verified empirical regimen could mean an alternative diagnosis (irritable larynx syndrome)  persistence of the disease state (eg sinusitis or bronchiectasis already excluded at this point)  , or inadequacy of currently available therapy (eg no medical rx available for non-acid gerd)  Which probably helps fuel the cough    Whatever the cause, she will not improve by going from doctor to doctor and needs to return with all meds in hand using a trust but verify approach to confirm accurate Medication  Reconciliation The principal here is that until we are certain that the  patients are doing what we've asked, it makes no sense to ask them to do more.   I had an extended discussion with the patient reviewing all relevant studies completed to date and  lasting 25 minutes of a 40  minute office  visit to re-establish   re  severe non-specific but potentially very serious refractory respiratory symptoms of uncertain and potentially multiple  etiologies.   Each maintenance medication was reviewed in detail including most importantly the difference between maintenance and prns and under what circumstances the prns are to be triggered using an action plan format that is not reflected in the computer generated alphabetically organized AVS.    Please see AVS for specific instructions unique to this office visit that  I personally wrote and verbalized to the the pt in detail and then reviewed with pt  by my nurse highlighting any changes in therapy/plan of care  recommended at today's visit.

## 2016-12-27 ENCOUNTER — Ambulatory Visit
Admission: RE | Admit: 2016-12-27 | Discharge: 2016-12-27 | Disposition: A | Discharge status: Home / Self Care | Payer: BC Managed Care – PPO | Source: Ambulatory Visit | Attending: Obstetrics and Gynecology | Admitting: Obstetrics and Gynecology

## 2016-12-27 DIAGNOSIS — Z1231 Encounter for screening mammogram for malignant neoplasm of breast: Secondary | ICD-10-CM

## 2016-12-29 ENCOUNTER — Other Ambulatory Visit: Payer: Self-pay | Admitting: Obstetrics and Gynecology

## 2016-12-29 DIAGNOSIS — R928 Other abnormal and inconclusive findings on diagnostic imaging of breast: Secondary | ICD-10-CM

## 2017-01-04 ENCOUNTER — Ambulatory Visit
Admission: RE | Admit: 2017-01-04 | Discharge: 2017-01-04 | Disposition: A | Discharge status: Home / Self Care | Payer: BC Managed Care – PPO | Source: Ambulatory Visit | Attending: Obstetrics and Gynecology | Admitting: Obstetrics and Gynecology

## 2017-01-04 DIAGNOSIS — R928 Other abnormal and inconclusive findings on diagnostic imaging of breast: Secondary | ICD-10-CM

## 2017-01-04 LAB — HM MAMMOGRAPHY

## 2017-01-13 LAB — VITAMIN D 25 HYDROXY (VIT D DEFICIENCY, FRACTURES): Vit D, 25-Hydroxy: 17

## 2017-01-17 ENCOUNTER — Encounter: Payer: Self-pay | Admitting: Internal Medicine

## 2017-01-17 ENCOUNTER — Ambulatory Visit (INDEPENDENT_AMBULATORY_CARE_PROVIDER_SITE_OTHER): Payer: BC Managed Care – PPO | Admitting: Internal Medicine

## 2017-01-17 DIAGNOSIS — R05 Cough: Secondary | ICD-10-CM | POA: Diagnosis not present

## 2017-01-17 DIAGNOSIS — J45991 Cough variant asthma: Secondary | ICD-10-CM | POA: Diagnosis not present

## 2017-01-17 DIAGNOSIS — R058 Other specified cough: Secondary | ICD-10-CM

## 2017-01-17 MED ORDER — BENZONATATE 200 MG PO CAPS: 200.0000 mg | ORAL_CAPSULE | Freq: Three times a day (TID) | ORAL | 1 refills | Status: DC | PRN

## 2017-01-17 NOTE — Patient Instructions (Addendum)
Take pepcid ac 20 mg 1 hour before bedtime and try zyrtec 10 mg one hour before bedtime  And stop clariton Take gabapentin 100 3 ine hour before bedtime and none during the day   As needed  > tessalon 200 mg up three time during day    Please schedule a follow up office visit in 4 weeks, sooner if needed

## 2017-01-17 NOTE — Assessment & Plan Note (Signed)
-   methacholine challenge   11/191/5 > pos for asthma/ no cough just chest tightness > trial of singulair started 07/08/14 > d/c 08/20/14 - 07/16/2014 p extensive coaching HFA effectiveness =    75% so try adding qvar 80 2bid on 07/24/14 > ? Cough worse > changed to 40 bid 08/21/2014  > no better    Looking at the record it's not clear to me whether there really is a steroid or singulair responsive component to her  Urge to cough but the cough at end exp is very suggestive  For now will leave off inhalers which tend to aggravate the larger component of her cough (uacs) and just give Prednisone 10 mg take  4 each am x 2 days,   2 each am x 2 days,  1 each am x 2 days and stop and then f/u

## 2017-01-17 NOTE — Progress Notes (Signed)
Subjective:   Patient ID: Deborah Ortega, female    DOB: 08-26-1965  MRN: [id number]    Brief patient profile:  20  yowf never smoker with onset  Early 2000's of sneezing and itching spring = fall not better on clariton and zyrtec never allergy eval then cough x around 2010 which is about the same time  she underwent craniotomy posteriorly for meningioma referred 12/26/2013 by Dr Basilio Cairo to pulmonary clinic.   History of Present Illness  12/26/2013 1st Deborah Ortega/ Deborah Ortega  Chief Complaint  Patient presents with  . Pulmonary Consult    Referred per Dr. Sherrye Payor. Pt c/o cough x 4-5 years, seems to be gradually getting worse. Cough is triggered by eating and talking. Cough is non prod, and sometimes coughs until the point of vomiting.   sister has exact same pattern, mother has bad gerd - pt no better previously on PPI but chewing mint gum, cough worse with chocolate rec Pantoprazole (protonix) 40 mg   Take 30-60 min before first meal of the day and Pepcid 20 mg one bedtime and chloretrimeton 4 mg take 2 at bedtime  until return to office - this is the best way to tell whether stomach acid is contributing to your problem.   Prednisone 10 mg take  4 each am x 2 days,   2 each am x 2 days,  1 each am x 2 days and stop Take delsym two tsp every 12 hours and supplement if needed with  tramadol 50 mg up to 2 every 4 hours to suppress the urge to cough. Swallowing water or using ice chips/non mint and menthol containing candies (such as lifesavers or sugarless jolly ranchers) are also effective.  You should rest your voice and avoid activities that you know make you cough. Once you have eliminated the cough for 3 straight days try reducing the tramadol first   01/23/2014 f/u ov/Deborah Ortega re: cough x 4 y  Chief Complaint  Patient presents with  . Follow-up    Pt states cough has improved slightly since last Ortega.  She feels it was better while on prednisone. No new co's today.    never took tramadol at all / noct cough is gone / never able to eliminate daytime urge to clear throat and cough   [name]  Do you awaken from a sound sleep coughing violently?                            With trouble breathing? Resolved    Do you have choking episodes when you cannot  Get enough air, gasping for air ?              Yes, still daytime   Do you usually cough when you lie down into  The bed, or when you just lie down to rest ?                          better   Do you usually cough after meals or eating?         Yes, still    Do you cough when (or after) you bend over?    no   GERD SCORE     Kouffman Reflux v Neurogenic Cough Differentiator Neurogenic   Do you more-or-less cough all day long? Spells    Does change of temperature  make you cough? Hot worse   Does laughing or chuckling cause you to cough? sometimes   Do fumes (perfume, automobile fumes, burned  Toast, etc.,) cause you to cough ?      yes   Does speaking, singing, or talking on the phone cause you to cough   ?               yes   Neurogenic/Airway score      rec First take delsym two tsp every 12 hours and supplement if needed with  tramadol 50 mg up to 2 every 4 hours  .   Gabapentin 100 mg three times a day until return  Prednisone 10 mg take  4 each am x 2 days,   2 each am x 2 days,  1 each am x 2 days and stop (this is to eliminate allergies and inflammation from coughing) Protonix (pantoprazole) Take 30-60 min before first meal of the day and Pepcid 20 mg one bedtime plus chlorpheniramine 4 mg x 1 at bedtime (both available over the counter)  until cough is completely gone for at least a week without the need for cough suppression GERD diet    03/05/2014 f/u ov/Deborah Ortega re: cough x 5 years Chief Complaint  Patient presents with  . Follow-up    Pt states that her cough has improved, but not resolved. No new co's today.   not able to take tramadol  and neurontin together but tessilon twice daily still having cough to point vomiting Cough more day than night  rec tessilon 200 four times day until no coughing at all x one week  Increase neurontin to four times (bfast lunch supper and bedtime ) Change the night time routine to take pepcid 20 mg and 2 x chloretrimeton one hour before bedtime  Please schedule a follow up office Ortega in 4 weeks, sooner if needed    03/25/2014 f/u ov/Deborah Ortega re: cough x 5 years  Chief Complaint  Patient presents with  . Acute Ortega    Pt states that her cough is still not improving. She had time off of work and so tried taking the tramadol- she took 1/2 tablet and went a whole day w/o cough. She states that the next day she tried taking med again and this caused vomiting. Now she is taking 1/4 tablet and no more vomiting, but still has the cough.   not coughing at all HS using one chloretrimeton and pepcd at bedtime but can't use chortrimeton daytime ? 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  rec Prednisone 10 mg take  4 each am x 2 days,   2 each am x 2 days,  1 each am x 2 days and stop and use tramadol / phenergan prn (didn't need it)    05/08/2014 f/u ov/Deborah Ortega re: cough x 5 years  Chief Complaint  Patient presents with  . Follow-up    Pt states that her cough is worse since she had developed a cold a wk ago. She has been zithromax and this helped somewhat.   was better  But never able to stop tessilon and then acutely ill p exp to sick son x one week prior to OV   Not clear that she's using neurontin consistently but does seem to help and does tolerate the 100 mg strength. rec Continue tessilon 200 mg up to every 4 hours / supplement with tramadol /phenergan if needed  as needed for nausea  Prednisone 10 mg take  4 each am x 2 days,   2 each am x 2 days,  1 each am x 2 days and stop   06/19/2014 f/u ov/Deborah Ortega re: cough x 5 y "don't remember when I wasn't  coughing  Chief Complaint  Patient presents with  . Follow-up    Pt states that her cough is unchanged. Unable to take tramadol due to her work and home schedule.   never took prednisone or tramadol in suppressive doses but with pepcid and chloretrimeton x 4 mg sleeps fine  Cough mostly provoked by voice use Not able to tolerate increased doses of neurontin s feeling groggy  rec methacholine challenge> pos for airflow obst/ neg for cough > start singulair      08/20/2014 f/u ov/Deborah Ortega re: cough x 5 y maint on singulair and qvar (23 doses left and should be on 12)  Chief Complaint  Patient presents with  . Follow-up    Pt c/o increased cough sicne 08/08/14- waking her up at night and "seems more congested".  She states that she coughs every time she uses her inhaler.    By 12/7 no benefit from singulair so added qvar 80 2bid but not sure she inhaled it corectly and it made her cough immediately then" got sick "08/08/14 p exp to sick fm member then a lot worse cough with fever nasal congestion / sore throat ? Assoc with finishing a sulfa abx for kidney infection so didn't take abx and the fever resolved Cough also at night but also with voice use and when eating now dry. rec Prednisone 10 mg take  4 each am x 2 days,   2 each am x 2 days,  1 each am x 2 days and stop  Change to Qvar 40 Take 2 puffs first thing in am and then another 2 puffs about 12 hours later.  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  Follow-up    follow up for patient continues to have cough patient in unable to get rid of   [name]  Do you awaken from a sound sleep coughing violently?                            With trouble breathing? Once a week no   Do you have choking episodes when you cannot  Get enough air, gasping for air ?              Yes   Do you usually cough when you lie down into  The bed, or when you just lie down to rest ?                          occasionally  Do you usually cough after meals or eating?         Yes esp crummy food   Do you cough when (or after) you bend over?    Yes   GERD SCORE     Kouffman Reflux v Neurogenic Cough Differentiator Neurogenic   Do you more-or-less cough all day long? Sporadic    Does change of temperature make you cough? no   Does laughing or chuckling cause you to cough? Yes    Do fumes (perfume, automobile fumes, burned  Toast, etc.,) cause you to cough ?      Yes def    Does speaking, singing, or talking on the phone cause you to cough   ?               Yes    Neurogenic/Airway score     Seems like exercise makes the cough worse but otherwise no specific trigger x as above rec Gabapentin 100 mg build up to three time daily with meals in addition to the 300 mg at bedtime  Prednisone 10 mg take  4 each am x 2 days,   2 each am x 2 days,  1 each am x 2 days and stop  Stay on acid suppression  GERD (REFLUX)  is an extremely common cause of respiratory symptoms just like yours , many times with no obvious heartburn at all.      01/17/2017  f/u ov/Deborah Ortega re: cough x 7 years Chief Complaint  Patient presents with  . Follow-up    Cough is unchanged.   omepazole 40 mg qd ac / omeprazole hs smidge better from prednisone, no better from singulair in past nor ICS in form of qvar 40 2bid  Can't take any 1st gen h1 or any dose of gabapentin during day, takes 2 at hs and still some noct cough p one chlortrimeton   Cough worse in pm's attributed to voice use (note this ov was last in pm an never coughed once)   Not limited by breathing from desired activities    No obvious day to day or daytime variability or assoc excess/ purulent sputum or mucus plugs or hemoptysis or cp or chest tightness, subjective wheeze or overt sinus or hb symptoms. No unusual exp hx or h/o childhood pna/ asthma or knowledge of premature birth.  Sleeping ok without nocturnal  or early am exacerbation  of respiratory  c/o's or need for noct saba. Also denies any obvious fluctuation of symptoms with weather or environmental changes or other aggravating or alleviating factors except as outlined above   Current Medications, Allergies, Complete Past Medical History, Past Surgical History, Family History, and Social History were reviewed in [employer] record.  ROS  The following are not active complaints unless bolded sore throat, dysphagia, dental problems, itching, sneezing,  nasal congestion or excess/ purulent secretions, ear ache,   fever, chills, sweats, unintended wt loss, classically pleuritic or exertional cp,  orthopnea pnd or leg swelling, presyncope, palpitations, abdominal pain, anorexia, nausea, vomiting, diarrhea  or change in bowel or bladder habits, change in stools or urine, dysuria,hematuria,  rash, arthralgias, visual complaints, headache, numbness, weakness or ataxia or problems with walking or coordination,  change in mood/affect or memory.                Objective:   Physical Exam  amb wf nad  / never coughed once during interview/ exam ? [place] affect   01/23/14  115 >  03/05/2014   117 > 03/25/2014  120 >  05/08/2014  120> 06/19/2014 119 >  07/16/2014  118 >  08/20/2014  116 >  12/16/2016  126 > 01/17/2017    126   Vital signs reviewed:   - Note on arrival 02 sats  100% on RA    HEENT: nl dentition, turbinates, and oropharynx with min cobblestoning, no excess pnds   Nl external ear canals  without cough reflex   NECK :  without JVD/Nodes/TM/ nl carotid upstrokes bilaterally   LUNGS: no acc muscle use, clear to A and P bilaterally with no cough at all with inps/ exp   CV:  RRR  no s3 or murmur or increase in P2, no edema   ABD:  soft and nontender with nl excursion in the supine position. No bruits or organomegaly, bowel sounds nl  MS:  warm without deformities, calf tenderness, cyanosis or clubbing                Assessment & Plan:

## 2017-01-18 NOTE — Assessment & Plan Note (Signed)
Followed in Pulmonary clinic/ [facility]/ [facility]  - methacholine challenge   11/191/5 > pos for asthma/ no cough just chest tightness > trial of singulair started 07/08/14 > d/c 08/20/14 - 07/16/2014 p extensive coaching HFA effectiveness =    75% so try adding qvar 80 2bid on 07/24/14 > ? Cough worse > changed to 40 bid 08/21/2014  > no better    No evidence of cough variant asthma - see uacs

## 2017-01-18 NOTE — Assessment & Plan Note (Signed)
-    Cyclical cough regimen  12/26/2013  > not completed as instructed -  01/23/14  added neurontin 100 mg tid > increased to qid 03/06/2014  - 2/53/6644 repeat cyclical cough regimen with tramadol/phenergan> did not take pred with it  - Allergy profile 06/19/2014 >  Eos 5.5%   IgE 86  Min Pos RAST for dust and [place] grass  - methacholine challenge   07/04/14  > pos for asthma see cough variant asthma  - Sinus CT 07/19/2014 >  No evidence of sinusitis. 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    Please see AVS for specific instructions unique to this visit that I personally wrote and verbalized to the the pt in detail and then reviewed with pt  by my nurse highlighting any  changes in therapy recommended at today's visit to their plan of care.

## 2017-02-15 ENCOUNTER — Ambulatory Visit (INDEPENDENT_AMBULATORY_CARE_PROVIDER_SITE_OTHER): Payer: BC Managed Care – PPO | Admitting: Internal Medicine

## 2017-02-15 ENCOUNTER — Encounter: Payer: Self-pay | Admitting: Internal Medicine

## 2017-02-15 VITALS — BP 110/64 | HR 95 | Ht 63.5 in | Wt 127.0 lb

## 2017-02-15 DIAGNOSIS — R05 Cough: Secondary | ICD-10-CM

## 2017-02-15 DIAGNOSIS — J45991 Cough variant asthma: Secondary | ICD-10-CM | POA: Diagnosis not present

## 2017-02-15 DIAGNOSIS — R058 Other specified cough: Secondary | ICD-10-CM

## 2017-02-15 NOTE — Progress Notes (Signed)
Subjective:   Patient ID: Deborah Ortega, female    DOB: 07/02/1966  MRN: [id number]    Brief patient profile:  56  yowf never smoker with onset  Early 2000's of sneezing and itching spring = fall not better on clariton and zyrtec never allergy eval then cough x around 2010 which is about the same time  she underwent craniotomy posteriorly for meningioma referred 12/26/2013 by Dr Asa Lente to pulmonary clinic.   History of Present Illness  12/26/2013 1st [facility] office visit/ Deborah Ortega  Chief Complaint  Patient presents with  . Pulmonary Consult    Referred per Dr. Sherrye Payor. Pt c/o cough x 4-5 years, seems to be gradually getting worse. Cough is triggered by eating and talking. Cough is non prod, and sometimes coughs until the point of vomiting.   sister has exact same pattern, mother has bad gerd - pt no better previously on PPI but chewing mint gum, cough worse with chocolate rec Pantoprazole (protonix) 40 mg   Take 30-60 min before first meal of the day and Pepcid 20 mg one bedtime and chloretrimeton 4 mg take 2 at bedtime  until return to office - this is the best way to tell whether stomach acid is contributing to your problem.   Prednisone 10 mg take  4 each am x 2 days,   2 each am x 2 days,  1 each am x 2 days and stop Take delsym two tsp every 12 hours and supplement if needed with  tramadol 50 mg up to 2 every 4 hours to suppress the urge to cough. Swallowing water or using ice chips/non mint and menthol containing candies (such as lifesavers or sugarless jolly ranchers) are also effective.  You should rest your voice and avoid activities that you know make you cough. Once you have eliminated the cough for 3 straight days try reducing the tramadol first   01/23/2014 f/u ov/Deborah Ortega re: cough x 4 y  Chief Complaint  Patient presents with  . Follow-up    Pt states cough has improved slightly since last visit.  She feels it was better while on prednisone. No new co's today.    never took tramadol at all / noct cough is gone / never able to eliminate daytime urge to clear throat and cough   [name]  Do you awaken from a sound sleep coughing violently?                            With trouble breathing? Resolved    Do you have choking episodes when you cannot  Get enough air, gasping for air ?              Yes, still daytime   Do you usually cough when you lie down into  The bed, or when you just lie down to rest ?                          better   Do you usually cough after meals or eating?         Yes, still    Do you cough when (or after) you bend over?    no   GERD SCORE     Kouffman Reflux v Neurogenic Cough Differentiator Neurogenic   Do you more-or-less cough all day long? Spells    Does change of temperature  make you cough? Hot worse   Does laughing or chuckling cause you to cough? sometimes   Do fumes (perfume, automobile fumes, burned  Toast, etc.,) cause you to cough ?      yes   Does speaking, singing, or talking on the phone cause you to cough   ?               yes   Neurogenic/Airway score      rec First take delsym two tsp every 12 hours and supplement if needed with  tramadol 50 mg up to 2 every 4 hours  .   Gabapentin 100 mg three times a day until return  Prednisone 10 mg take  4 each am x 2 days,   2 each am x 2 days,  1 each am x 2 days and stop (this is to eliminate allergies and inflammation from coughing) Protonix (pantoprazole) Take 30-60 min before first meal of the day and Pepcid 20 mg one bedtime plus chlorpheniramine 4 mg x 1 at bedtime (both available over the counter)  until cough is completely gone for at least a week without the need for cough suppression GERD diet    03/05/2014 f/u ov/Deborah Ortega re: cough x 5 years Chief Complaint  Patient presents with  . Follow-up    Pt states that her cough has improved, but not resolved. No new co's today.   not able to take tramadol  and neurontin together but tessilon twice daily still having cough to point vomiting Cough more day than night  rec tessilon 200 four times day until no coughing at all x one week  Increase neurontin to four times (bfast lunch supper and bedtime ) Change the night time routine to take pepcid 20 mg and 2 x chloretrimeton one hour before bedtime  Please schedule a follow up office visit in 4 weeks, sooner if needed    03/25/2014 f/u ov/Deborah Ortega re: cough x 5 years  Chief Complaint  Patient presents with  . Acute Visit    Pt states that her cough is still not improving. She had time off of work and so tried taking the tramadol- she took 1/2 tablet and went a whole day w/o cough. She states that the next day she tried taking med again and this caused vomiting. Now she is taking 1/4 tablet and no more vomiting, but still has the cough.   not coughing at all HS using one chloretrimeton and pepcd at bedtime but can't use chortrimeton daytime ? 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  rec Prednisone 10 mg take  4 each am x 2 days,   2 each am x 2 days,  1 each am x 2 days and stop and use tramadol / phenergan prn (didn't need it)    05/08/2014 f/u ov/Deborah Ortega re: cough x 5 years  Chief Complaint  Patient presents with  . Follow-up    Pt states that her cough is worse since she had developed a cold a wk ago. She has been zithromax and this helped somewhat.   was better  But never able to stop tessilon and then acutely ill p exp to sick son x one week prior to OV   Not clear that she's using neurontin consistently but does seem to help and does tolerate the 100 mg strength. rec Continue tessilon 200 mg up to every 4 hours / supplement with tramadol /phenergan if needed  as needed for nausea  Prednisone 10 mg take  4 each am x 2 days,   2 each am x 2 days,  1 each am x 2 days and stop   06/19/2014 f/u ov/Deborah Ortega re: cough x 5 y "don't remember when I wasn't  coughing"  Chief Complaint  Patient presents with  . Follow-up    Pt states that her cough is unchanged. Unable to take tramadol due to her work and home schedule.   never took prednisone or tramadol in suppressive doses but with pepcid and chloretrimeton x 4 mg sleeps fine  Cough mostly provoked by voice use Not able to tolerate increased doses of neurontin s feeling groggy  rec methacholine challenge> pos for airflow obst/ neg for cough > start singulair      08/20/2014 f/u ov/Deborah Ortega re: cough x 5 y maint on singulair and qvar (23 doses left and should be on 12)  Chief Complaint  Patient presents with  . Follow-up    Pt c/o increased cough sicne 08/08/14- waking her up at night and "seems more congested".  She states that she coughs every time she uses her inhaler.    By 12/7 no benefit from singulair so added qvar 80 2bid but not sure she inhaled it corectly and it made her cough immediately then" got sick "08/08/14 p exp to sick fm member then a lot worse cough with fever nasal congestion / sore throat ? Assoc with finishing a sulfa abx for kidney infection so didn't take abx and the fever resolved Cough also at night but also with voice use and when eating now dry. rec Prednisone 10 mg take  4 each am x 2 days,   2 each am x 2 days,  1 each am x 2 days and stop  Change to Qvar 40 Take 2 puffs first thing in am and then another 2 puffs about 12 hours later.  Neurontin 300 mg at bedtime and 100 as much as you can up to three times daily with meals If all else fails > norco one every 4 hours as needed  If not better in 2 weeks call for referral to [place] referred and multiple attempts to rx by multiple providers but never better since then so self referred back to pulmonary clinic 12/16/2016    12/16/2016  f/u ov/Deborah Ortega re:  Cough x 7 yeaxr with Pos MCT  On prilosec 40 mg q am / zantac 300 mg hs / neurontin 300 hs and pamelor, no asthma rx at all  Chief Complaint  Patient presents with  .  Follow-up    follow up for patient continues to have cough patient in unable to get rid of   [name]  Do you awaken from a sound sleep coughing violently?                            With trouble breathing? Once a week no   Do you have choking episodes when you cannot  Get enough air, gasping for air ?              Yes   Do you usually cough when you lie down into  The bed, or when you just lie down to rest ?                          occasionally  Do you usually cough after meals or eating?         Yes esp crummy food   Do you cough when (or after) you bend over?    Yes   GERD SCORE     Kouffman Reflux v Neurogenic Cough Differentiator Neurogenic   Do you more-or-less cough all day long? Sporadic    Does change of temperature make you cough? no   Does laughing or chuckling cause you to cough? Yes    Do fumes (perfume, automobile fumes, burned  Toast, etc.,) cause you to cough ?      Yes def    Does speaking, singing, or talking on the phone cause you to cough   ?               Yes    Neurogenic/Airway score     Seems like exercise makes the cough worse but otherwise no specific trigger x as above rec Gabapentin 100 mg build up to three time daily with meals in addition to the 300 mg at bedtime  Prednisone 10 mg take  4 each am x 2 days,   2 each am x 2 days,  1 each am x 2 days and stop  Stay on acid suppression  GERD (REFLUX)  is an extremely common cause of respiratory symptoms just like yours , many times with no obvious heartburn at all.      01/17/2017  f/u ov/Deborah Ortega re: cough x 7 years Chief Complaint  Patient presents with  . Follow-up    Cough is unchanged.   omepazole 40 mg qd ac / omeprazole hs smidge better from prednisone, no better from singulair in past nor ICS in form of qvar 40 2bid  Can't take any 1st gen h1 or any dose of gabapentin during day, takes 2 at hs and still some noct cough p one chlortrimeton   Cough worse in pm's attributed to voice use (note this ov was last one in pm an never coughed once)  Not limited by breathing from desired activities   rec Take pepcid ac 20 mg 1 hour before bedtime and try zyrtec 10 mg one hour before bedtime  And stop clariton Take gabapentin 100 3 w/in  hour before bedtime and none during the day  As needed  > tessalon 200 mg up three time during day     02/15/2017  f/u ov/Deborah Ortega re: cough x 7 years  Chief Complaint  Patient presents with  . Follow-up    Cough no longer waking her up in the night. No new co's today.   taking gabapentin 100 x 3  At hs and zyrtec and chlortrimeton and not sure about h2 "just a brown pill"  Not limited by breathing from desired activities    No obvious day to day or daytime variability or assoc sob  or cp or chest tightness, subjective wheeze or overt sinus or hb symptoms. No unusual exp hx or h/o childhood pna/ asthma or knowledge of premature birth.  Sleeping ok without nocturnal  or early am exacerbation  of respiratory  c/o's or need for noct saba. Also denies any obvious fluctuation of symptoms with weather or environmental changes or other aggravating or alleviating factors except as outlined above   Current Medications, Allergies, Complete Past Medical History, Past Surgical History, Family History, and Social History were reviewed in [employer] record.  ROS  The following are not active complaints unless bolded sore throat, dysphagia, dental  problems, itching, sneezing,  nasal congestion or excess/ purulent secretions, ear ache,   fever, chills, sweats, unintended wt loss, classically pleuritic or exertional cp, hemoptysis,  orthopnea pnd or leg swelling, presyncope, palpitations, abdominal pain, anorexia, nausea, vomiting, diarrhea  or change in bowel or bladder habits, change in stools or urine, dysuria,hematuria,  rash, arthralgias, visual complaints, headache, numbness, weakness or ataxia or  problems with walking or coordination,  change in mood/affect or memory.                    Objective:   Physical Exam  amb wf nad     01/23/14  115 >  03/05/2014   117 > 03/25/2014  120 >  05/08/2014  120> 06/19/2014 119 >  07/16/2014  118 >  08/20/2014  116 >  12/16/2016  126 > 01/17/2017    126 > 02/16/2017   127    Vital signs reviewed:   - Note on arrival 02 sats  98% on RA    HEENT: nl dentition, turbinates, and oropharynx with  min cobblestoning, no excess pnds   Nl external ear canals without cough reflex   NECK :  without JVD/Nodes/TM/ nl carotid upstrokes bilaterally   LUNGS: no acc muscle use, clear to A and P bilaterally with no cough at all with inps/ exp   CV:  RRR  no s3 or murmur or increase in P2, no edema   ABD:  soft and nontender with nl excursion in the supine position. No bruits or organomegaly, bowel sounds nl  MS:  warm without deformities, calf tenderness, cyanosis or clubbing                Assessment & Plan:

## 2017-02-15 NOTE — Patient Instructions (Signed)
If coughing or clearing the throat at all take tessalon 200 mg three times a day   See Tammy NP  In 4 weeks with all your medications, even over the counter meds, separated in two separate bags, the ones you take no matter what vs the ones you stop once you feel better and take only as needed when you feel you need them.   Tammy  will generate for you a new user friendly medication calendar that will put [place] all on the same page re: your medication use.     Without this process, it simply isn't possible to assure that we are providing  your outpatient care  with  the attention to detail we feel you deserve.   If we cannot assure that you're getting that kind of care,  then we cannot manage your problem effectively from this clinic.  Once you have seen Tammy and we are sure that we're all on the same page with your medication use she will arrange follow up with me.

## 2017-02-16 ENCOUNTER — Encounter: Payer: Self-pay | Admitting: Internal Medicine

## 2017-02-16 NOTE — Assessment & Plan Note (Signed)
-    Cyclical cough regimen  12/26/2013  > not completed as instructed -  01/23/14  added neurontin 100 mg tid > increased to qid 03/06/2014  - 0/76/2263 repeat cyclical cough regimen with tramadol/phenergan> did not take pred with it  - Allergy profile 06/19/2014 >  Eos 5.5%   IgE 86  Min Pos RAST for dust and [place] grass  - methacholine challenge   07/04/14  > pos for asthma see cough variant asthma  - Sinus CT 07/19/2014 >  No evidence of sinusitis. - 08/20/14 changed neurontin to 300 qhs and 100 tid daytime  - returned 12/16/2016 on just 300 hs so rec restart 100 tid > could not tolerate   - returned 01/17/2017 on ppi hs and stated cough worse hs again >  rec h2 hs and 1st gen H1, gabapentin 100 x 3 and pt added zyrtec > improved 02/15/2017 so rec stay on same rx    After 7 years she has found a remedy for her noct cough but interestingly doesn't identify what medicine "the little brown pill" is.  Therefore rec she stay on exactly same rx until returns for full medication reconciliation.    To keep things simple, I have asked the patient to first separate medicines that are perceived as maintenance, that is to be taken daily "no matter what", from those medicines that are taken on only on an as-needed basis and I have given the patient examples of both, and then return to see our NP to generate a  detailed  medication calendar which should be followed until the next physician sees the patient and updates it.

## 2017-02-16 NOTE — Assessment & Plan Note (Addendum)
-   methacholine challenge   11/191/5 > pos for asthma/ no cough just chest tightness > trial of singulair started 07/08/14 > d/c 08/20/14 - 07/16/2014 p extensive coaching HFA effectiveness =    75% so try adding qvar 80 2bid on 07/24/14 > ? Cough worse > changed to 40 bid 08/21/2014  > no better   Although the MCT is positive, it did not cause a cough and she's had no flare of sob or chest tightness off all asthma rx so I strongly doubt it's contributing to her cough and will leave off all meds

## 2017-03-04 LAB — VITAMIN D 25 HYDROXY (VIT D DEFICIENCY, FRACTURES): VIT D 25 HYDROXY: 46

## 2017-03-22 ENCOUNTER — Encounter: Payer: BC Managed Care – PPO | Admitting: Adult Health

## 2017-03-24 ENCOUNTER — Telehealth: Payer: Self-pay | Admitting: Internal Medicine

## 2017-03-29 ENCOUNTER — Encounter: Payer: Self-pay | Admitting: Adult Health

## 2017-03-29 ENCOUNTER — Ambulatory Visit (INDEPENDENT_AMBULATORY_CARE_PROVIDER_SITE_OTHER)
Admission: RE | Admit: 2017-03-29 | Discharge: 2017-03-29 | Disposition: A | Discharge status: Home / Self Care | Payer: BC Managed Care – PPO | Source: Ambulatory Visit | Attending: Adult Health | Admitting: Adult Health

## 2017-03-29 ENCOUNTER — Other Ambulatory Visit: Payer: BC Managed Care – PPO

## 2017-03-29 ENCOUNTER — Ambulatory Visit (INDEPENDENT_AMBULATORY_CARE_PROVIDER_SITE_OTHER): Payer: BC Managed Care – PPO | Admitting: Adult Health

## 2017-03-29 VITALS — BP 116/72 | HR 77 | Ht 63.5 in | Wt 129.8 lb

## 2017-03-29 DIAGNOSIS — R05 Cough: Secondary | ICD-10-CM

## 2017-03-29 DIAGNOSIS — J45991 Cough variant asthma: Secondary | ICD-10-CM

## 2017-03-29 DIAGNOSIS — R058 Other specified cough: Secondary | ICD-10-CM

## 2017-03-29 NOTE — Progress Notes (Addendum)
@Patient  ID: Deborah Ortega, female    DOB: May 03, 1966, 51 y.o.   MRN: [id number]  Chief Complaint  Patient presents with  . Follow-up    Cough     Referring provider: Hoyt Koch, *  HPI: 51 year old female never smoker followed for chronic cough, asthma History of meningioma status post craniotomy May 2015  TEST  methacholine challenge   11/191/5 > pos for asthma/ no cough just chest tightness > trial of singulair started 07/08/14 > d/c 08/20/14 - 07/16/2014 p extensive coaching HFA effectiveness =    75% so try adding qvar 80 2bid on 07/24/14 > ? Cough worse > changed to 40 bid 08/21/2014  > no better  Allergy profile 06/19/2014 >  Eos 5.5%   IgE 86  Min Pos RAST for dust and [place] grass  - methacholine challenge   07/04/14  > pos for asthma see cough variant asthma  - Sinus CT 07/19/2014 >  No evidence of sinusitis.  03/29/2017 Follow up : Chronic cough  Returns for 1 month follow-up. Patient has a known chronic cough that has been treated over the last 3 years. She's had multiple regimens of reflux and sinus prevention regimens. She's also been started on Neurontin with some improvement in her cough. She did have a positive methacholine challenge test. However, was tried on Singulair and Qvar without any significant improvement. Patient returns today says that her cough is some improved, however, it never goes away. It is hard to determine when she could have a coughing fit. She has been seen by ENT and allergy without any significant improvement in symptoms. Pt  does request lab work today. She says that she has a sister that has a chronic cough as well. And has recently been tested positive for rheumatoid arthritis. She is currently awaiting a rheumatology referral. She also has a cousin that has rheumatoid arthritis.  Patient says she was recently seen at her doctor and was tested for Sjogren's that returned negative. Patient denies any joint pain, rashes . Chest x-ray in 2015  was clear. We reviewed all her medications organize them into a medication count with patient education.   Allergies  Allergen Reactions  . Ibuprofen Rash  . Buprenorphine Hcl Itching  . Levofloxacin     Cannot sleep  . Morphine And Related Itching  . Tramadol     Extreme sleeplessness/vomiting- can tolerate small dose per pt  . Adhesive [Tape] Rash  . Aleve [Naproxen Sodium] Rash  . Latex Rash  . Phenylephrine-Dm Anxiety    Immunization History  Administered Date(s) Administered  . Influenza Split 05/16/2016  . Influenza,inj,Quad PF,36+ Mos 05/08/2014, 06/10/2015  . Td 06/16/2009    Past Medical History:  Diagnosis Date  . ALLERGIC RHINITIS   . Anemia   . Anxiety   . Asthma    being worked up for asthma now. Chronic cough  . Depression    takes lexapro  . GERD   . History of kidney stones   . HOH (hard of hearing)    both ears  . 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 MIGRAINE HEADACHE   . PONV (postoperative nausea and vomiting)   . RENAL CALCULUS, HX OF 06/25/2007, 02/2013  . THYROID NODULE     Tobacco History: History  Smoking Status  . Never Smoker  Smokeless Tobacco  . Never Used    Comment: Married, lives with spouse and 2 kids. Pt is a Pharmacist, hospital   Counseling given:  Not Answered   Outpatient Encounter Prescriptions as of 03/29/2017  Medication Sig  . benzonatate (TESSALON) 200 MG capsule Take 200 mg by mouth 4 (four) times daily as needed for cough.  . Calcium Carb-Cholecalciferol (CALCIUM-VITAMIN D) 500-200 MG-UNIT tablet Take 1 tablet by mouth daily.  . cetirizine (ZYRTEC) 10 MG tablet Take 10 mg by mouth at bedtime.   . chlorpheniramine (CHLOR-TRIMETON) 4 MG tablet Take 4 mg by mouth at bedtime. May add 1 extra every 4 hours as needed for drippy nose, drainage, throat clearing.  . docusate sodium (COLACE) 100 MG capsule Take 100 mg by mouth at bedtime.   Marland Kitchen escitalopram (LEXAPRO) 20 MG tablet Take 20 mg by mouth daily.  . ferrous sulfate 325 (65  FE) MG tablet Take 325 mg by mouth at bedtime.  . gabapentin (NEURONTIN) 100 MG capsule Take 3 daily at bedtime  . omeprazole (PRILOSEC) 40 MG capsule Take 40 mg by mouth 2 (two) times daily.  . [DISCONTINUED] acetaminophen (TYLENOL) 500 MG tablet Take 1,000 mg by mouth every 6 (six) hours as needed for mild pain (pain). Reported on 08/19/2015  . [DISCONTINUED] benzonatate (TESSALON) 200 MG capsule Take 1 capsule (200 mg total) by mouth 3 (three) times daily as needed for cough.  . [DISCONTINUED] gabapentin (NEURONTIN) 100 MG capsule Take 1 capsule (100 mg total) by mouth 3 (three) times daily. One three times daily  . [DISCONTINUED] omeprazole (PRILOSEC) 40 MG capsule Take 1 capsule (40 mg total) by mouth every morning.  . [DISCONTINUED] UNABLE TO FIND Med Name: [place] daily   No facility-administered encounter medications on file as of 03/29/2017.      Review of Systems  Constitutional:   No  weight loss, night sweats,  Fevers, chills, fatigue, or  lassitude.  HEENT:   No headaches,  Difficulty swallowing,  Tooth/dental problems, or  Sore throat,                No sneezing, itching, ear ache, nasal congestion, post nasal drip,   CV:  No chest pain,  Orthopnea, PND, swelling in lower extremities, anasarca, dizziness, palpitations, syncope.   GI  No heartburn, indigestion, abdominal pain, nausea, vomiting, diarrhea, change in bowel habits, loss of appetite, bloody stools.   Resp:    No chest wall deformity  Skin: no rash or lesions.  GU: no dysuria, change in color of urine, no urgency or frequency.  No flank pain, no hematuria   MS:  No joint pain or swelling.  No decreased range of motion.  No back pain.    Physical Exam  BP 116/72 (BP Location: Left Arm, Cuff Size: Normal)   Pulse 77   Ht 5' 3.5" (1.613 m)   Wt 129 lb 12.8 oz (58.9 kg)   LMP 03/25/2017   SpO2 100%   BMI 22.63 kg/m   GEN: A/Ox3; pleasant , NAD, well nourished    HEENT:  [place]/AT,  EACs-clear, TMs-wnl,  NOSE-clear, THROAT-clear, no lesions, no postnasal drip or exudate noted.   NECK:  Supple w/ fair ROM; no JVD; normal carotid impulses w/o bruits; no thyromegaly or nodules palpated; no lymphadenopathy.    RESP  Clear  P & A; w/o, wheezes/ rales/ or rhonchi. no accessory muscle use, no dullness to percussion  CARD:  RRR, no m/r/g, no peripheral edema, pulses intact, no cyanosis or clubbing.  GI:   Soft & nt; nml bowel sounds; no organomegaly or masses detected.   Musco: Warm bil, no deformities or joint  swelling noted.   Neuro: alert, no focal deficits noted.    Skin: Warm, no lesions or rashes    Lab Results:  CBC  BMET  BNP No results found for: BNP  ProBNP No results found for: PROBNP  Imaging: Dg Chest 2 View  Result Date: 03/29/2017 CLINICAL DATA:  Chronic cough for 6 years. Shortness of breath and wheezing. EXAM: CHEST  2 VIEW COMPARISON:  06/19/2014 FINDINGS: Nodular density projecting over the right lower lung is unchanged in the interval. Lungs are otherwise clear. The cardiopericardial silhouette is within normal limits for size. The visualized bony structures of the thorax are intact. IMPRESSION: Stable.  No acute findings. Electronically Signed   By: Misty Stanley M.D.   On: 03/29/2017 17:23     Assessment & Plan:   Upper airway cough syndrome ? Chronic cough ? Etiology  Patient's medications were reviewed today and patient education was given. Computerized medication calendar was adjusted/completed   Check ANA /RA factor , if positive refer to rheumatology and check HRCT Chest  Check PFT , if restriction or low DLCO consider HRCT chest.  Cont on cough prevention regimen .    Cough variant asthma Check PFT on return       Tammy Parrett, NP 03/29/2017

## 2017-03-29 NOTE — Assessment & Plan Note (Signed)
Check PFT on return  

## 2017-03-29 NOTE — Progress Notes (Signed)
Chart and office note reviewed in detail  > agree with a/p as outlined    

## 2017-03-29 NOTE — Assessment & Plan Note (Signed)
?   Chronic cough ? Etiology  Patient's medications were reviewed today and patient education was given. Computerized medication calendar was adjusted/completed   Check ANA /RA factor , if positive refer to rheumatology and check HRCT Chest  Check PFT , if restriction or low DLCO consider HRCT chest.  Cont on cough prevention regimen .

## 2017-03-29 NOTE — Patient Instructions (Signed)
Follow med calendar closely and bring to each visit.  Labs today .  Chest xray today .  Follow up with Dr. Melvyn Novas  In 6-8 weeks with PFT .

## 2017-03-30 LAB — RHEUMATOID FACTOR

## 2017-03-30 LAB — ANA: Anti Nuclear Antibody(ANA): NEGATIVE

## 2017-03-31 ENCOUNTER — Ambulatory Visit (INDEPENDENT_AMBULATORY_CARE_PROVIDER_SITE_OTHER): Payer: BC Managed Care – PPO | Admitting: Nurse Practitioner

## 2017-03-31 ENCOUNTER — Encounter: Payer: Self-pay | Admitting: Nurse Practitioner

## 2017-03-31 VITALS — BP 102/70 | HR 70 | Temp 98.2°F | Ht 63.5 in | Wt 130.0 lb

## 2017-03-31 DIAGNOSIS — D649 Anemia, unspecified: Secondary | ICD-10-CM | POA: Insufficient documentation

## 2017-03-31 DIAGNOSIS — E041 Nontoxic single thyroid nodule: Secondary | ICD-10-CM

## 2017-03-31 DIAGNOSIS — Z0000 Encounter for general adult medical examination without abnormal findings: Secondary | ICD-10-CM | POA: Diagnosis not present

## 2017-03-31 DIAGNOSIS — D500 Iron deficiency anemia secondary to blood loss (chronic): Secondary | ICD-10-CM | POA: Diagnosis not present

## 2017-03-31 DIAGNOSIS — Z87898 Personal history of other specified conditions: Secondary | ICD-10-CM | POA: Insufficient documentation

## 2017-03-31 NOTE — Patient Instructions (Addendum)
Check with GYN which lab done recently.   Have lab results faxed to our office.  Health Maintenance, Female Adopting a healthy lifestyle and getting preventive care can go a long way to promote health and wellness. Talk with your health care provider about what schedule of regular examinations is right for you. This is a good chance for you to check in with your provider about disease prevention and staying healthy. In between checkups, there are plenty of things you can do on your own. Experts have done a lot of research about which lifestyle changes and preventive measures are most likely to keep you healthy. Ask your health care provider for more information. Weight and diet Eat a healthy diet  Be sure to include plenty of vegetables, fruits, low-fat dairy products, and lean protein.  Do not eat a lot of foods high in solid fats, added sugars, or salt.  Get regular exercise. This is one of the most important things you can do for your health. ? Most adults should exercise for at least 150 minutes each week. The exercise should increase your heart rate and make you sweat (moderate-intensity exercise). ? Most adults should also do strengthening exercises at least twice a week. This is in addition to the moderate-intensity exercise.  Maintain a healthy weight  Body mass index (BMI) is a measurement that can be used to identify possible weight problems. It estimates body fat based on height and weight. Your health care provider can help determine your BMI and help you achieve or maintain a healthy weight.  For females 49 years of age and older: ? A BMI below 18.5 is considered underweight. ? A BMI of 18.5 to 24.9 is normal. ? A BMI of 25 to 29.9 is considered overweight. ? A BMI of 30 and above is considered obese.  Watch levels of cholesterol and blood lipids  You should start having your blood tested for lipids and cholesterol at 49 years of age, then have this test every 5  years.  You may need to have your cholesterol levels checked more often if: ? Your lipid or cholesterol levels are high. ? You are older than 49 years of age. ? You are at high risk for heart disease.  Cancer screening Lung Cancer  Lung cancer screening is recommended for adults 49-67 years old who are at high risk for lung cancer because of a history of smoking.  A yearly low-dose CT scan of the lungs is recommended for people who: ? Currently smoke. ? Have quit within the past 15 years. ? Have at least a 30-pack-year history of smoking. A pack year is smoking an average of one pack of cigarettes a day for 1 year.  Yearly screening should continue until it has been 15 years since you quit.  Yearly screening should stop if you develop a health problem that would prevent you from having lung cancer treatment.  Breast Cancer  Practice breast self-awareness. This means understanding how your breasts normally appear and feel.  It also means doing regular breast self-exams. Let your health care provider know about any changes, no matter how small.  If you are in your 20s or 30s, you should have a clinical breast exam (CBE) by a health care provider every 1-3 years as part of a regular health exam.  If you are 49 or older, have a CBE every year. Also consider having a breast X-ray (mammogram) every year.  If you have a family history of breast  cancer, talk to your health care provider about genetic screening.  If you are at high risk for breast cancer, talk to your health care provider about having an MRI and a mammogram every year.  Breast cancer gene (BRCA) assessment is recommended for women who have family members with BRCA-related cancers. BRCA-related cancers include: ? Breast. ? Ovarian. ? Tubal. ? Peritoneal cancers.  Results of the assessment will determine the need for genetic counseling and BRCA1 and BRCA2 testing.  Cervical Cancer Your health care provider may  recommend that you be screened regularly for cancer of the pelvic organs (ovaries, uterus, and vagina). This screening involves a pelvic examination, including checking for microscopic changes to the surface of your cervix (Pap test). You may be encouraged to have this screening done every 3 years, beginning at age 49.  For women ages 45-65, health care providers may recommend pelvic exams and Pap testing every 3 years, or they may recommend the Pap and pelvic exam, combined with testing for human papilloma virus (HPV), every 5 years. Some types of HPV increase your risk of cervical cancer. Testing for HPV may also be done on women of any age with unclear Pap test results.  Other health care providers may not recommend any screening for nonpregnant women who are considered low risk for pelvic cancer and who do not have symptoms. Ask your health care provider if a screening pelvic exam is right for you.  If you have had past treatment for cervical cancer or a condition that could lead to cancer, you need Pap tests and screening for cancer for at least 20 years after your treatment. If Pap tests have been discontinued, your risk factors (such as having a new sexual partner) need to be reassessed to determine if screening should resume. Some women have medical problems that increase the chance of getting cervical cancer. In these cases, your health care provider may recommend more frequent screening and Pap tests.  Colorectal Cancer  This type of cancer can be detected and often prevented.  Routine colorectal cancer screening usually begins at 49 years of age and continues through 49 years of age.  Your health care provider may recommend screening at an earlier age if you have risk factors for colon cancer.  Your health care provider may also recommend using home test kits to check for hidden blood in the stool.  A small camera at the end of a tube can be used to examine your colon directly  (sigmoidoscopy or colonoscopy). This is done to check for the earliest forms of colorectal cancer.  Routine screening usually begins at age 49.  Direct examination of the colon should be repeated every 5-10 years through 49 years of age. However, you may need to be screened more often if early forms of precancerous polyps or small growths are found.  Skin Cancer  Check your skin from head to toe regularly.  Tell your health care provider about any new moles or changes in moles, especially if there is a change in a mole's shape or color.  Also tell your health care provider if you have a mole that is larger than the size of a pencil eraser.  Always use sunscreen. Apply sunscreen liberally and repeatedly throughout the day.  Protect yourself by wearing long sleeves, pants, a wide-brimmed hat, and sunglasses whenever you are outside.  Heart disease, diabetes, and high blood pressure  High blood pressure causes heart disease and increases the risk of stroke. High blood pressure  is more likely to develop in: ? People who have blood pressure in the high end of the normal range (130-139/85-89 mm Hg). ? People who are overweight or obese. ? People who are African American.  If you are 38-40 years of age, have your blood pressure checked every 3-5 years. If you are 62 years of age or older, have your blood pressure checked every year. You should have your blood pressure measured twice-once when you are at a hospital or clinic, and once when you are not at a hospital or clinic. Record the average of the two measurements. To check your blood pressure when you are not at a hospital or clinic, you can use: ? An automated blood pressure machine at a pharmacy. ? A home blood pressure monitor.  If you are between 51 years and 54 years old, ask your health care provider if you should take aspirin to prevent strokes.  Have regular diabetes screenings. This involves taking a blood sample to check your  fasting blood sugar level. ? If you are at a normal weight and have a low risk for diabetes, have this test once every three years after 49 years of age. ? If you are overweight and have a high risk for diabetes, consider being tested at a younger age or more often. Preventing infection Hepatitis B  If you have a higher risk for hepatitis B, you should be screened for this virus. You are considered at high risk for hepatitis B if: ? You were born in a country where hepatitis B is common. Ask your health care provider which countries are considered high risk. ? Your parents were born in a high-risk country, and you have not been immunized against hepatitis B (hepatitis B vaccine). ? You have HIV or AIDS. ? You use needles to inject street drugs. ? You live with someone who has hepatitis B. ? You have had sex with someone who has hepatitis B. ? You get hemodialysis treatment. ? You take certain medicines for conditions, including cancer, organ transplantation, and autoimmune conditions.  Hepatitis C  Blood testing is recommended for: ? Everyone born from [birth year] through 1963. ? Anyone with known risk factors for hepatitis C.  Sexually transmitted infections (STIs)  You should be screened for sexually transmitted infections (STIs) including gonorrhea and chlamydia if: ? You are sexually active and are younger than 49 years of age. ? You are older than 49 years of age and your health care provider tells you that you are at risk for this type of infection. ? Your sexual activity has changed since you were last screened and you are at an increased risk for chlamydia or gonorrhea. Ask your health care provider if you are at risk.  If you do not have HIV, but are at risk, it may be recommended that you take a prescription medicine daily to prevent HIV infection. This is called pre-exposure prophylaxis (PrEP). You are considered at risk if: ? You are sexually active and do not regularly use condoms  or know the HIV status of your partner(s). ? You take drugs by injection. ? You are sexually active with a partner who has HIV.  Talk with your health care provider about whether you are at high risk of being infected with HIV. If you choose to begin PrEP, you should first be tested for HIV. You should then be tested every 3 months for as long as you are taking PrEP. Pregnancy  If you are premenopausal and  you may become pregnant, ask your health care provider about preconception counseling.  If you may become pregnant, take 400 to 800 micrograms (mcg) of folic acid every day.  If you want to prevent pregnancy, talk to your health care provider about birth control (contraception). Osteoporosis and menopause  Osteoporosis is a disease in which the bones lose minerals and strength with aging. This can result in serious bone fractures. Your risk for osteoporosis can be identified using a bone density scan.  If you are 43 years of age or older, or if you are at risk for osteoporosis and fractures, ask your health care provider if you should be screened.  Ask your health care provider whether you should take a calcium or vitamin D supplement to lower your risk for osteoporosis.  Menopause may have certain physical symptoms and risks.  Hormone replacement therapy may reduce some of these symptoms and risks. Talk to your health care provider about whether hormone replacement therapy is right for you. Follow these instructions at home:  Schedule regular health, dental, and eye exams.  Stay current with your immunizations.  Do not use any tobacco products including cigarettes, chewing tobacco, or electronic cigarettes.  If you are pregnant, do not drink alcohol.  If you are breastfeeding, limit how much and how often you drink alcohol.  Limit alcohol intake to no more than 1 drink per day for nonpregnant women. One drink equals 12 ounces of beer, 5 ounces of wine, or 1 ounces of hard  liquor.  Do not use street drugs.  Do not share needles.  Ask your health care provider for help if you need support or information about quitting drugs.  Tell your health care provider if you often feel depressed.  Tell your health care provider if you have ever been abused or do not feel safe at home. This information is not intended to replace advice given to you by your health care provider. Make sure you discuss any questions you have with your health care provider. Document Released: 02/15/2011 Document Revised: 01/08/2016 Document Reviewed: 05/06/2015 Elsevier Interactive Patient Education  [employer].

## 2017-03-31 NOTE — Progress Notes (Signed)
Subjective:    Patient ID: Deborah Ortega, female    DOB: [birth year], 51 y.o.   MRN: [id number]  Patient presents today for complete physical  HPI denies any acute complains.  Thyroid nodule: monitored by with Dr. Loanne Drilling. Previous use of synthroid, which was discontinued.  Anemia: Managed by GYN. CBC and iron panel done <53months ago.  Immunizations: (TDAP, Hep C screen, Pneumovax, Influenza, zoster)  Health Maintenance  Topic Date Due  . Flu Shot  03/16/2017  . HIV Screening  03/16/2018*  . Pap Smear  11/25/2017  . Tetanus Vaccine  06/17/2019  *Topic was postponed. The date shown is not the original due date.   Diet:regular.  Weight:  Wt Readings from Last 3 Encounters:  03/31/17 130 lb (59 kg)  03/29/17 129 lb 12.8 oz (58.9 kg)  02/15/17 127 lb (57.6 kg)   Exercise:not regularly.  Fall Risk: Fall Risk  03/31/2017  Falls in the past year? No   Home Safety:home alone.  Depression/Suicide: Depression screen [facility] 2/9 03/31/2017 10/11/2015  Decreased Interest 0 0  Down, Depressed, Hopeless 0 0  PHQ - 2 Score 0 0   Pap Smear (every 51yrs for >21-29 without HPV, every 36yrs for >30-84yrs with HPV):done by Dr. Radene Knee.  Mammogram (yearly, >16yrs):up to date.  Vision:up to date  Dental:up to date.  Advanced Directive: Advanced Directives 06/24/2014  Does Patient Have a Medical Advance Directive? No  Would patient like information on creating a medical advance directive? No - patient declined information    Medications and allergies reviewed with patient and updated if appropriate.  Patient Active Problem List   Diagnosis Date Noted  . Anemia 03/31/2017  . History of brain tumor 03/31/2017  . Routine general medical examination at a health care facility 12/15/2014  . Cough variant asthma 07/08/2014  . 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 Upper airway cough syndrome 02/06/2013  . THYROID NODULE 07/16/2009  . ALLERGIC RHINITIS 07/15/2009  . GERD  07/15/2009    Current Outpatient Prescriptions on File Prior to Visit  Medication Sig Dispense Refill  . benzonatate (TESSALON) 200 MG capsule Take 200 mg by mouth 4 (four) times daily as needed for cough.    . Calcium Carb-Cholecalciferol (CALCIUM-VITAMIN D) 500-200 MG-UNIT tablet Take 1 tablet by mouth daily.    . cetirizine (ZYRTEC) 10 MG tablet Take 10 mg by mouth at bedtime.     . chlorpheniramine (CHLOR-TRIMETON) 4 MG tablet Take 4 mg by mouth at bedtime. May add 1 extra every 4 hours as needed for drippy nose, drainage, throat clearing.    . docusate sodium (COLACE) 100 MG capsule Take 100 mg by mouth at bedtime.     Marland Kitchen escitalopram (LEXAPRO) 20 MG tablet Take 20 mg by mouth daily.    . ferrous sulfate 325 (65 FE) MG tablet Take 325 mg by mouth at bedtime.    . gabapentin (NEURONTIN) 100 MG capsule Take 3 daily at bedtime    . omeprazole (PRILOSEC) 40 MG capsule Take 40 mg by mouth 2 (two) times daily.     No current facility-administered medications on file prior to visit.     Past Medical History:  Diagnosis Date  . ALLERGIC RHINITIS   . Anemia   . Anxiety   . Asthma    being worked up for asthma now. Chronic cough  . Depression    takes lexapro  . GERD   . History of kidney stones   . HOH (hard of hearing)  both ears  . 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 MIGRAINE HEADACHE   . PONV (postoperative nausea and vomiting)   . RENAL CALCULUS, HX OF 06/25/2007, 02/2013  . THYROID NODULE     Past Surgical History:  Procedure Laterality Date  . BREAST BIOPSY Right 10/29/2013  . BREAST LUMPECTOMY WITH RADIOACTIVE SEED LOCALIZATION Right 11/19/2013   Procedure: BREAST LUMPECTOMY WITH RADIOACTIVE SEED LOCALIZATION;  Surgeon: Adin Hector, MD;  Location: [place];  Service: General;  Laterality: Right;  . CESAREAN SECTION     x's 2  . Meninogioma Resection  07/25/09   pariatal area head-crainiotomy-plate  . WISDOM TOOTH EXTRACTION      Social  History   Social History  . Marital status: Married    Spouse name: N/A  . Number of children: N/A  . Years of education: N/A   Social History Main Topics  . Smoking status: Never Smoker  . Smokeless tobacco: Never Used     Comment: Married, lives with spouse and 2 kids. Pt is a Pharmacist, hospital  . Alcohol use No  . Drug use: No  . Sexual activity: Not Asked   Other Topics Concern  . None   Social History Narrative  . None    Family History  Problem Relation Age of Onset  . Diabetes Other   . Hyperlipidemia Other   . Stroke Other   . Ulcerative colitis Other   . Asthma Cousin         Review of Systems  Constitutional: Negative for fever, malaise/fatigue and weight loss.  HENT: Negative for congestion and sore throat.   Eyes:       Negative for visual changes  Respiratory: Negative for cough and shortness of breath.   Cardiovascular: Negative for chest pain, palpitations and leg swelling.  Gastrointestinal: Negative for blood in stool, constipation, diarrhea and heartburn.  Genitourinary: Negative for dysuria, frequency and urgency.  Musculoskeletal: Negative for falls, joint pain and myalgias.  Skin: Negative for rash.  Neurological: Negative for dizziness, sensory change and headaches.  Endo/Heme/Allergies: Does not bruise/bleed easily.  Psychiatric/Behavioral: Negative for depression, substance abuse and suicidal ideas. The patient is not nervous/anxious and does not have insomnia.     Objective:   Vitals:   03/31/17 1457  BP: 102/70  Pulse: 70  Temp: 98.2 F (36.8 C)  SpO2: 99%    Body mass index is 22.67 kg/m.   Physical Examination:  Physical Exam  Constitutional: She is oriented to person, place, and time and well-developed, well-nourished, and in no distress. No distress.  HENT:  Right Ear: External ear normal.  Left Ear: External ear normal.  Nose: Nose normal.  Mouth/Throat: Oropharynx is clear and moist. No oropharyngeal exudate.  Eyes:  Pupils are equal, round, and reactive to light. Conjunctivae and EOM are normal. No scleral icterus.  Neck: Normal range of motion. Neck supple. No thyromegaly present.  Cardiovascular: Normal rate, normal heart sounds and intact distal pulses.   Pulmonary/Chest: Effort normal and breath sounds normal. She exhibits no tenderness.  Abdominal: Soft. Bowel sounds are normal. She exhibits no distension. There is no tenderness.  Genitourinary:  Genitourinary Comments: Breast and pelvic Deferred to GYN by patient  Musculoskeletal: Normal range of motion. She exhibits no edema or tenderness.  Lymphadenopathy:    She has no cervical adenopathy.  Neurological: She is alert and oriented to person, place, and time. Gait normal.  Skin: Skin is warm and dry.  Psychiatric: Affect  and judgment normal.    ASSESSMENT and PLAN:  Mida was seen today for annual exam.  Diagnoses and all orders for this visit:  Preventative health care -     Comprehensive metabolic panel; Future  THYROID NODULE -     TSH; Future -     T4, free; Future -     T3, free; Future  Iron deficiency anemia due to chronic blood loss  History of brain tumor  Other orders -     Cancel: Hemoglobin A1c; Future -     Cancel: B12; Future   No problem-specific Assessment & Plan notes found for this encounter.     Follow up: Return if symptoms worsen or fail to improve.  Wilfred Lacy, NP

## 2017-04-01 ENCOUNTER — Encounter: Payer: Self-pay | Admitting: Internal Medicine

## 2017-04-01 ENCOUNTER — Other Ambulatory Visit (INDEPENDENT_AMBULATORY_CARE_PROVIDER_SITE_OTHER): Payer: BC Managed Care – PPO

## 2017-04-01 DIAGNOSIS — Z0000 Encounter for general adult medical examination without abnormal findings: Secondary | ICD-10-CM | POA: Diagnosis not present

## 2017-04-01 DIAGNOSIS — E041 Nontoxic single thyroid nodule: Secondary | ICD-10-CM | POA: Diagnosis not present

## 2017-04-01 LAB — COMPREHENSIVE METABOLIC PANEL
ALT: 13 U/L (ref 0–35)
AST: 16 U/L (ref 0–37)
Albumin: 3.8 g/dL (ref 3.5–5.2)
Alkaline Phosphatase: 49 U/L (ref 39–117)
BUN: 9 mg/dL (ref 6–23)
CHLORIDE: 104 meq/L (ref 96–112)
CO2: 29 meq/L (ref 19–32)
CREATININE: 0.66 mg/dL (ref 0.40–1.20)
Calcium: 9 mg/dL (ref 8.4–10.5)
GFR: 101.02 mL/min (ref >60.00)
Glucose, Bld: 88 mg/dL (ref 70–99)
Potassium: 3.7 mEq/L (ref 3.5–5.1)
SODIUM: 138 meq/L (ref 135–145)
Total Bilirubin: 0.3 mg/dL (ref 0.2–1.2)
Total Protein: 6.4 g/dL (ref 6.0–8.3)

## 2017-04-01 LAB — TSH: TSH: 0.63 u[IU]/mL (ref 0.35–4.50)

## 2017-04-01 LAB — GLIADIN DEAMIDATED PEPT AB,IGG: GLIADIN(DEAM) AB,IGG: 2

## 2017-04-01 LAB — GLIADIN DEAMIDATED PEPT AB,IGA: Gliadin(Deam) Ab,IgA: 3

## 2017-04-01 LAB — SJOGREN'S ANTI-SS-B: SJOGREN'S ANTI-SS-B: NEGATIVE

## 2017-04-01 LAB — SJOGREN'S SYNDROME ANTIBODS(SSA + SSB): SJOGREN'S ANTI-SS-A: NEGATIVE

## 2017-04-01 LAB — TISSUE TRANSGLUTAMINASE, IGG: TISSUE TRANSGLUTAMINASE IGG ABY: 2

## 2017-04-01 LAB — ENDOMYSIAL AB IGA TITER: ENDOMIGATIT: NEGATIVE

## 2017-04-01 LAB — RHEUMATOID FACTOR: Rheumatoid Arthritis Factor: <14

## 2017-04-01 LAB — SEDIMENTATION RATE: Sedimentation Rate-Westergren: 5

## 2017-04-01 LAB — T4, FREE: Free T4: 0.9 ng/dL (ref 0.60–1.60)

## 2017-04-01 LAB — ANA: ANA BY IFA SDFS: NEGATIVE

## 2017-04-01 LAB — TISSUE TRANSGLUTAMINASE, IGA: Tissue Transglutaminase Ab, IgA: 1

## 2017-04-01 LAB — T3, FREE: T3, Free: 3.9 pg/mL (ref 2.3–4.2)

## 2017-04-01 LAB — IGA: IgA: 60

## 2017-05-10 ENCOUNTER — Ambulatory Visit: Payer: BC Managed Care – PPO | Admitting: Internal Medicine

## 2017-05-27 ENCOUNTER — Encounter: Payer: Self-pay | Admitting: Internal Medicine

## 2017-05-27 ENCOUNTER — Ambulatory Visit (INDEPENDENT_AMBULATORY_CARE_PROVIDER_SITE_OTHER): Payer: BC Managed Care – PPO | Admitting: Internal Medicine

## 2017-05-27 VITALS — BP 122/74 | HR 71 | Ht 63.5 in | Wt 129.0 lb

## 2017-05-27 DIAGNOSIS — R058 Other specified cough: Secondary | ICD-10-CM

## 2017-05-27 DIAGNOSIS — R05 Cough: Secondary | ICD-10-CM

## 2017-05-27 DIAGNOSIS — J45991 Cough variant asthma: Secondary | ICD-10-CM | POA: Diagnosis not present

## 2017-05-27 LAB — PULMONARY FUNCTION TEST
DL/VA % PRED: 115 %
DL/VA: 5.41 ml/min/mmHg/L
DLCO UNC % PRED: 101 %
DLCO UNC: 23.18 ml/min/mmHg
DLCO cor % pred: 100 %
DLCO cor: 22.97 ml/min/mmHg
FEF 25-75 PRE: 2.36 L/s
FEF 25-75 Post: 3.04 L/sec
FEF2575-%Change-Post: 28 %
FEF2575-%Pred-Post: 111 %
FEF2575-%Pred-Pre: 86 %
FEV1-%Change-Post: 11 %
FEV1-%PRED-POST: 95 %
FEV1-%PRED-PRE: 86 %
FEV1-PRE: 2.35 L
FEV1-Post: 2.61 L
FEV1FVC-%Change-Post: 4 %
FEV1FVC-%Pred-Pre: 102 %
FEV6-%Change-Post: 6 %
FEV6-%PRED-POST: 90 %
FEV6-%Pred-Pre: 85 %
FEV6-POST: 3.03 L
FEV6-Pre: 2.86 L
FEV6FVC-%PRED-POST: 103 %
FEV6FVC-%PRED-PRE: 103 %
FVC-%Change-Post: 6 %
FVC-%Pred-Post: 88 %
FVC-%Pred-Pre: 83 %
FVC-Post: 3.03 L
FVC-Pre: 2.86 L
PRE FEV6/FVC RATIO: 100 %
Post FEV1/FVC ratio: 86 %
Post FEV6/FVC ratio: 100 %
Pre FEV1/FVC ratio: 82 %
RV % PRED: 98 %
RV: 1.7 L
TLC % pred: 94 %
TLC: 4.61 L

## 2017-05-27 MED ORDER — OMEPRAZOLE 40 MG PO CPDR: 40.0000 mg | DELAYED_RELEASE_CAPSULE | Freq: Two times a day (BID) | ORAL | 1 refills | Status: DC

## 2017-05-27 MED ORDER — BUDESONIDE-FORMOTEROL FUMARATE 80-4.5 MCG/ACT IN AERO: 2.0000 | INHALATION_SPRAY | Freq: Two times a day (BID) | RESPIRATORY_TRACT | 0 refills | Status: DC

## 2017-05-27 MED ORDER — BUDESONIDE-FORMOTEROL FUMARATE 80-4.5 MCG/ACT IN AERO: 2.0000 | INHALATION_SPRAY | Freq: Two times a day (BID) | RESPIRATORY_TRACT | 11 refills | Status: DC

## 2017-05-27 NOTE — Progress Notes (Signed)
Subjective:   Patient ID: Deborah Ortega, female    DOB: 1966-05-10  MRN: [id number]    Brief patient profile:  89  yowf never smoker with onset  Early 2000's of sneezing and itching spring = fall not better on clariton and zyrtec never allergy eval then cough x around 2010 which is about the same time  she underwent craniotomy posteriorly for meningioma referred 12/26/2013 by Dr Asa Lente to pulmonary clinic.   History of Present Illness  12/26/2013 1st [facility] office visit/ Deborah Ortega  Chief Complaint  Patient presents with  . Pulmonary Consult    Referred per Dr. Sherrye Payor. Pt c/o cough x 4-5 years, seems to be gradually getting worse. Cough is triggered by eating and talking. Cough is non prod, and sometimes coughs until the point of vomiting.   sister has exact same pattern, mother has bad gerd - pt no better previously on PPI but chewing mint gum, cough worse with chocolate rec Pantoprazole (protonix) 40 mg   Take 30-60 min before first meal of the day and Pepcid 20 mg one bedtime and chloretrimeton 4 mg take 2 at bedtime  until return to office - this is the best way to tell whether stomach acid is contributing to your problem.   Prednisone 10 mg take  4 each am x 2 days,   2 each am x 2 days,  1 each am x 2 days and stop Take delsym two tsp every 12 hours and supplement if needed with  tramadol 50 mg up to 2 every 4 hours    01/23/2014 f/u ov/Deborah Ortega re: cough x 4 y  Chief Complaint  Patient presents with  . Follow-up    Pt states cough has improved slightly since last visit.  She feels it was better while on prednisone. No new co's today.   never took tramadol at all / noct cough is gone / never able to eliminate daytime urge to clear throat and cough   [name]  Do you awaken from a sound sleep coughing violently?                            With trouble breathing? Resolved    Do you have choking episodes when you  cannot  Get enough air, gasping for air ?              Yes, still daytime   Do you usually cough when you lie down into  The bed, or when you just lie down to rest ?                          better   Do you usually cough after meals or eating?         Yes, still    Do you cough when (or after) you bend over?    no   GERD SCORE     Kouffman Reflux v Neurogenic Cough Differentiator Neurogenic   Do you more-or-less cough all day long? Spells    Does change of temperature make you cough? Hot worse   Does laughing or chuckling cause you to cough? sometimes   Do fumes (perfume, automobile fumes, burned  Toast, etc.,) cause you to cough ?      yes   Does speaking, singing, or talking on the phone cause you to cough   ?  yes   Neurogenic/Airway score      rec First take delsym two tsp every 12 hours and supplement if needed with  tramadol 50 mg up to 2 every 4 hours  .   Gabapentin 100 mg three times a day until return  Prednisone 10 mg take  4 each am x 2 days,   2 each am x 2 days,  1 each am x 2 days and stop (this is to eliminate allergies and inflammation from coughing) Protonix (pantoprazole) Take 30-60 min before first meal of the day and Pepcid 20 mg one bedtime plus chlorpheniramine 4 mg x 1 at bedtime (both available over the counter)  until cough is completely gone for at least a week without the need for cough suppression GERD diet    03/05/2014 f/u ov/Deborah Ortega re: cough x 5 years Chief Complaint  Patient presents with  . Follow-up    Pt states that her cough has improved, but not resolved. No new co's today.   not able to take tramadol and neurontin together but tessilon twice daily still having cough to point vomiting Cough more day than night  rec tessilon 200 four times day until no coughing at all x one week  Increase neurontin to four times (bfast lunch supper and bedtime ) Change the night time routine to take pepcid 20 mg and 2 x chloretrimeton one hour before  bedtime  Please schedule a follow up office visit in 4 weeks, sooner if needed    03/25/2014 f/u ov/Deborah Ortega re: cough x 5 years  Chief Complaint  Patient presents with  . Acute Visit    Pt states that her cough is still not improving. She had time off of work and so tried taking the tramadol- she took 1/2 tablet and went a whole day w/o cough. She states that the next day she tried taking med again and this caused vomiting. Now she is taking 1/4 tablet and no more vomiting, but still has the cough.   not coughing at all HS using one chloretrimeton and pepcd at bedtime but can't use chortrimeton daytime ? 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  rec Prednisone 10 mg take  4 each am x 2 days,   2 each am x 2 days,  1 each am x 2 days and stop and use tramadol / phenergan prn (didn't need it)    05/08/2014 f/u ov/Deborah Ortega re: cough x 5 years  Chief Complaint  Patient presents with  . Follow-up    Pt states that her cough is worse since she had developed a cold a wk ago. She has been zithromax and this helped somewhat.   was better  But never able to stop tessilon and then acutely ill p exp to sick son x one week prior to OV   Not clear that she's using neurontin consistently but does seem to help and does tolerate the 100 mg strength. rec Continue tessilon 200 mg up to every 4 hours / supplement with tramadol /phenergan if needed as needed for nausea  Prednisone 10 mg take  4 each am x 2 days,   2 each am x 2 days,  1 each am x 2 days and stop   06/19/2014 f/u ov/Deborah Ortega re: cough x 5 y "don't remember when I wasn't coughing"  Chief Complaint  Patient presents with  . Follow-up    Pt states that her cough is unchanged.  Unable to take tramadol due to her work and home schedule.   never took prednisone or tramadol in suppressive doses but with pepcid and chloretrimeton x 4 mg sleeps fine  Cough mostly provoked by voice use Not able to tolerate increased  doses of neurontin s feeling groggy  rec methacholine challenge> pos for airflow obst/ neg for cough > start singulair      08/20/2014 f/u ov/Deborah Ortega re: cough x 5 y maint on singulair and qvar (23 doses left and should be on 12)  Chief Complaint  Patient presents with  . Follow-up    Pt c/o increased cough sicne 08/08/14- waking her up at night and "seems more congested".  She states that she coughs every time she uses her inhaler.    By 12/7 no benefit from singulair so added qvar 80 2bid but not sure she inhaled it corectly and it made her cough immediately then" got sick "08/08/14 p exp to sick fm member then a lot worse cough with fever nasal congestion / sore throat ? Assoc with finishing a sulfa abx for kidney infection so didn't take abx and the fever resolved Cough also at night but also with voice use and when eating now dry. rec Prednisone 10 mg take  4 each am x 2 days,   2 each am x 2 days,  1 each am x 2 days and stop  Change to Qvar 40 Take 2 puffs first thing in am and then another 2 puffs about 12 hours later.  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 Follow-up    follow up for patient continues to have cough patient in unable to get rid of   [name]  Do you awaken from a sound sleep coughing violently?                            With trouble breathing? Once a week no   Do you have choking episodes when you cannot  Get enough air,  gasping for air ?              Yes   Do you usually cough when you lie down into  The bed, or when you just lie down to rest ?                          occasionally   Do you usually cough after meals or eating?         Yes esp crummy food   Do you cough when (or after) you bend over?    Yes   GERD SCORE     Kouffman Reflux v Neurogenic Cough Differentiator Neurogenic   Do you more-or-less cough all day long? Sporadic    Does change of temperature  make you cough? no   Does laughing or chuckling cause you to cough? Yes    Do fumes (perfume, automobile fumes, burned  Toast, etc.,) cause you to cough ?      Yes def    Does speaking, singing, or talking on the phone cause you to cough   ?               Yes    Neurogenic/Airway score     Seems like exercise makes the cough worse but otherwise no specific trigger x as above rec Gabapentin 100 mg build up to three time daily with meals in addition to the 300 mg at bedtime  Prednisone 10 mg take  4 each am x 2 days,   2 each am x 2 days,  1 each am x 2 days and stop  Stay on acid suppression  GERD (REFLUX)  is an extremely common cause of respiratory symptoms just like yours , many times with no obvious heartburn at all.      01/17/2017  f/u ov/Deborah Ortega re: cough x 7 years Chief Complaint  Patient presents with  . Follow-up    Cough is unchanged.   omepazole 40 mg qd ac / omeprazole hs smidge better from prednisone, no better from singulair in past nor ICS in form of qvar 40 2bid  Can't take any 1st gen h1 or any dose of gabapentin during day, takes 2 at hs and still some noct cough p one chlortrimeton  Cough worse in pm's attributed to voice use (note this ov was last one in pm an never coughed once)  Not limited by breathing from desired activities   rec Take pepcid ac 20 mg 1 hour before bedtime and try zyrtec 10 mg one hour before bedtime  And stop clariton Take gabapentin 100 3 w/in  hour before bedtime and none during the day  As needed   > tessalon 200 mg up three time during day     02/15/2017  f/u ov/Deborah Ortega re: cough x 7 years  Chief Complaint  Patient presents with  . Follow-up    Cough no longer waking her up in the night. No new co's today.   taking gabapentin 100 x 3  At hs and zyrtec and chlortrimeton and not sure about h2 "just a brown pill" rec If coughing or clearing the throat at all take tessalon 200 mg three times a day      05/27/2017  f/u ov/Deborah Ortega re: cough x > 7y neg resp to rx at [facility] allergy/ ent/ gi  Chief Complaint  Patient presents with  . Follow-up    feels like something is in her throat   daytime cough / not noct at all p taking both h1 and zyrtec at hs and 300 gabapentin Helps to sip water, not using hard rock candy as rec / triggered by strong smells eg cologne   Not limited by breathing from desired activities    No obvious day to day or daytime variability or assoc excess/ purulent sputum or mucus plugs or hemoptysis or cp or chest tightness, subjective wheeze or overt sinus or hb symptoms. No unusual exp hx or h/o childhood pna/ asthma or knowledge of premature birth.  Sleeping ok flat without nocturnal  or early am exacerbation  of respiratory  c/o's or need for noct saba. Also denies any obvious fluctuation of symptoms with weather or environmental changes or other aggravating or alleviating factors except as outlined above  Current Allergies, Complete Past Medical History, Past Surgical History, Family History, and Social History were reviewed in [employer] record.  ROS  The following are not active complaints unless bolded Hoarseness, sore throat, dysphagia, dental problems, itching, sneezing,  nasal congestion or discharge of excess mucus or purulent secretions, ear ache,   fever, chills, sweats, unintended wt loss or wt gain, classically pleuritic or exertional cp,  orthopnea pnd or leg swelling, presyncope, palpitations, abdominal pain, anorexia, nausea,  vomiting, diarrhea  or change in bowel habits or change in bladder habits, change in stools or change in urine, dysuria, hematuria,  rash, arthralgias, visual complaints, headache, numbness, weakness or ataxia or problems with walking or coordination,  change in mood/affect or memory.        Current Meds  Medication Sig  . benzonatate (TESSALON) 200 MG capsule Take 200 mg by mouth 4 (four) times daily as needed for cough.  . Calcium Carb-Cholecalciferol (CALCIUM-VITAMIN D) 500-200 MG-UNIT tablet Take 1 tablet by mouth daily.  . cetirizine (ZYRTEC) 10 MG tablet Take 10 mg by mouth at bedtime.   . chlorpheniramine (CHLOR-TRIMETON) 4 MG tablet Take 4 mg by mouth at bedtime. May add 1 extra every 4 hours as needed for drippy nose, drainage, throat clearing.  . docusate sodium (COLACE) 100 MG capsule Take 100 mg by mouth at bedtime.   Marland Kitchen escitalopram (LEXAPRO) 20 MG tablet Take 20 mg by mouth daily.  . ferrous sulfate 325 (65 FE) MG tablet Take 325 mg by mouth at bedtime.  . gabapentin (NEURONTIN) 100 MG capsule Take 3 daily at bedtime  . omeprazole (PRILOSEC) 40 MG capsule Take 1 capsule (40 mg total) by mouth 2 (two) times daily.  . [DISCONTINUED] omeprazole (PRILOSEC) 40 MG capsule Take 40 mg by mouth 2 (two) times daily.                    Objective:   Physical Exam  amb wf nad     01/23/14  115 >  03/05/2014   117 > 03/25/2014  120 >  05/08/2014  120> 06/19/2014 119 >  07/16/2014  118 >  08/20/2014  116 >  12/16/2016  126 > 01/17/2017    126 > 02/16/2017   127 >  05/27/2017  129    Vital signs reviewed:   - Note on arrival 02 sats  98% on RA    HEENT: nl dentition, turbinates, and oropharynx clear   Nl external ear canals without cough reflex   NECK :  without JVD/Nodes/TM/ nl carotid upstrokes bilaterally   LUNGS: no acc muscle use, clear to A and P bilaterally with no cough at all with inps/ exp   CV:  RRR  no s3 or murmur or increase in P2, no edema   ABD:  soft and nontender with  nl excursion in the supine position. No bruits or organomegaly, bowel sounds nl  MS:  warm without deformities, calf tenderness, cyanosis or clubbing                Assessment & Plan:

## 2017-05-27 NOTE — Patient Instructions (Addendum)
GERD (REFLUX)  is an extremely common cause of respiratory symptoms just like yours , many times with no obvious heartburn at all.    It can be treated with medication, but also with lifestyle changes including elevation of the head of your bed (ideally with 6 inch  bed blocks),  Smoking cessation, avoidance of late meals, excessive alcohol, and avoid fatty foods, chocolate, peppermint, colas, red wine, and acidic juices such as orange juice.  NO MINT OR MENTHOL PRODUCTS SO NO COUGH DROPS   USE SUGARLESS CANDY INSTEAD (Jolley ranchers or Stover's or Life Savers) or even ice chips will also do - the key is to swallow to prevent all throat clearing. NO OIL BASED VITAMINS - use powdered substitutes.     Try symbicort 80 Take 2 puffs first thing in am and then another 2 puffs about 12 hours later.    If better continue symbiocort 80 indefinitely and if not next step is Dr Joya Gaskins  - follow up here is as needed

## 2017-05-27 NOTE — Progress Notes (Signed)
PFT done today. 

## 2017-05-29 NOTE — Assessment & Plan Note (Signed)
-    Cyclical cough regimen  12/26/2013  > not completed as instructed -  01/23/14  added neurontin 100 mg tid > increased to qid 03/06/2014  - 1/91/6606 repeat cyclical cough regimen with tramadol/phenergan> did not take pred with it  - Allergy profile 06/19/2014 >  Eos 5.5%   IgE 86  Min Pos RAST for dust and [place] grass  - methacholine challenge   07/04/14  > pos for asthma see cough variant asthma  - Sinus CT 07/19/2014 >  No evidence of sinusitis. - 08/20/14 changed neurontin to 300 qhs and 100 tid daytime  - returned 12/16/2016 on just 300 hs so rec restart 100 tid > could not tolerate  - returned 01/17/2017 on ppi hs and stated cough worse hs again > rec h2 hs and 1st gen H1, gabapentin 100 x 3 and pt added zyrtec > improved 02/15/2017 so rec stay on same rx    Reports worsening globus daytime and not able to use any daytime antihistamines or gabapentin due to cns effects  All I can rec at this point is use hard rock candy in addition to sips of water and f/ with Dr Joya Gaskins at [facility] if wants someone to "look to see if there's something stuck down there" (points to neck)   see avs for instructions unique to this ov

## 2017-05-29 NOTE — Assessment & Plan Note (Addendum)
-   methacholine challenge   11/191/5 > pos for asthma/ no cough just chest tightness > trial of singulair started 07/08/14 > d/c 08/20/14 as not convinced helped  - 07/16/2014 p extensive coaching HFA effectiveness =    75% so try adding qvar 80 2bid on 07/24/14 > ? Cough worse > changed to 40 bid 08/21/2014  > no better    - PFT's  05/27/2017  FEV1 2.61 (95 % ) ratio 86  p 11 % improvement from saba p nothing prior to study with DLCO  101 % corrects to 115  % for alv volume   - 05/27/2017  After extensive coaching HFA effectiveness =    75% > try symbiocort 80 2bid   Note review suggests has asthma but ics alone nore singulair helped so reasonabl e to try symb 80 2bid to see if helps the cough or actually worsens it as her main complaint today is a globus sensation that would have nothing to do with the pft findings (see separate a/p)    I had an extended discussion with the patient reviewing all relevant studies completed to date and  lasting 15 to 20 minutes of a 25 minute visit    Each maintenance medication was reviewed in detail including most importantly the difference between maintenance and prns and under what circumstances the prns are to be triggered using an action plan format that is not reflected in the computer generated alphabetically organized AVS.    Please see AVS for specific instructions unique to this visit that I personally wrote and verbalized to the the pt in detail and then reviewed with pt  by my nurse highlighting any  changes in therapy recommended at today's visit to their plan of care.    If better on symbicort 80 continue, if not refer back to [facility]/ voice center

## 2017-09-08 ENCOUNTER — Other Ambulatory Visit: Payer: Self-pay | Admitting: Internal Medicine

## 2017-09-08 NOTE — Telephone Encounter (Signed)
Fine to refill x 6 months then will need to regroup with whomever wrote it in the first place if she thinks it helped her

## 2017-09-08 NOTE — Telephone Encounter (Signed)
MW please advise on dosage to write- rx is not on pt's med list and comes in both 5 and 7.5mg  thanks!

## 2017-09-08 NOTE — Telephone Encounter (Signed)
Spoke with pt, she states she would like the medication called Salagen. It is a medication that makes you salivate and was wondering if MW will prescribe this for her. I thought her PCP could prescribe but she wanted to see if MW would write for it, Please advise   Author: Tanda Rockers, MD Author Type: Physician Filed: 05/29/2017 5:47 AM  Note Status: Bernell List: Cosign Not Required Encounter Date: 05/27/2017  Problem: Cough variant asthma  Editor: Tanda Rockers, MD (Physician)  Prior Versions: 1. Tanda Rockers, MD (Physician) at 05/29/2017 5:44 AM - Written     - methacholine challenge   11/191/5 > pos for asthma/ no cough just chest tightness > trial of singulair started 07/08/14 > d/c 08/20/14 as not convinced helped  - 07/16/2014 p extensive coaching HFA effectiveness =    75% so try adding qvar 80 2bid on 07/24/14 > ? Cough worse > changed to 40 bid 08/21/2014  > no better    - PFT's  05/27/2017  FEV1 2.61 (95 % ) ratio 86  p 11 % improvement from saba p nothing prior to study with DLCO  101 % corrects to 115  % for alv volume   - 05/27/2017  After extensive coaching HFA effectiveness =    75% > try symbiocort 80 2bid   Note review suggests has asthma but ics alone nore singulair helped so reasonabl e to try symb 80 2bid to see if helps the cough or actually worsens it as her main complaint today is a globus sensation that would have nothing to do with the pft findings (see separate a/p)    I had an extended discussion with the patient reviewing all relevant studies completed to date and  lasting 15 to 20 minutes of a 25 minute visit    Each maintenance medication was reviewed in detail including most importantly the difference between maintenance and prns and under what circumstances the prns are to be triggered using an action plan format that is not reflected in the computer generated alphabetically organized AVS.    Please see AVS for specific instructions unique to this  visit that I personally wrote and verbalized to the the pt in detail and then reviewed with pt  by my nurse highlighting any  changes in therapy recommended at today's visit to their plan of care.    If better on symbicort 80 continue, if not refer back to [facility]/ voice center    Assessment & Plan Note by Tanda Rockers, MD at 05/29/2017 5:44 AM   Author: Tanda Rockers, MD Author Type: Physician Filed: 05/29/2017 5:46 AM  Note Status: Written Cosign: Cosign Not Required Encounter Date: 05/27/2017  Problem: Upper airway cough syndrome  Editor: Tanda Rockers, MD (Physician)    -  Cyclical cough regimen  12/26/2013  > not completed as instructed -  01/23/14  added neurontin 100 mg tid > increased to qid 03/06/2014  - 2/87/8676 repeat cyclical cough regimen with tramadol/phenergan> did not take pred with it  - Allergy profile 06/19/2014 >  Eos 5.5%   IgE 86  Min Pos RAST for dust and [place] grass  - methacholine challenge   07/04/14  > pos for asthma see cough variant asthma  - Sinus CT 07/19/2014 >  No evidence of sinusitis. - 08/20/14 changed neurontin to 300 qhs and 100 tid daytime

## 2017-09-09 ENCOUNTER — Telehealth: Payer: Self-pay | Admitting: Internal Medicine

## 2017-09-09 NOTE — Telephone Encounter (Signed)
If she doesn't know what dose she was on then just use the 5 mg but this is just a one time rx from me as I don't use this med so not comfortable with multiple refills -  she should go back to whomever prescribed it for refills in the future

## 2017-09-09 NOTE — Telephone Encounter (Signed)
ATC patient but her VM was full. Will call in medication once she is aware of the one time refill.

## 2017-09-09 NOTE — Telephone Encounter (Signed)
Called pt to discuss, I thought I sent this to MW yesterday but I do not see the message. Pt would like a prescription for Salogen. MW would you write for this?  I advised her that she want to ask her PCP but she wants to see if you will write this. Please advise.   Patient Instructions by Tanda Rockers, MD at 05/27/2017 4:00 PM   Author: Tanda Rockers, MD Author Type: Physician Filed: 05/27/2017 4:29 PM  Note Status: Addendum Cosign: Cosign Not Required Encounter Date: 05/27/2017  Editor: Tanda Rockers, MD (Physician)  Prior Versions: 1. Tanda Rockers, MD (Physician) at 05/27/2017 4:25 PM - Signed    GERD (REFLUX)  is an extremely common cause of respiratory symptoms just like yours , many times with no obvious heartburn at all.    It can be treated with medication, but also with lifestyle changes including elevation of the head of your bed (ideally with 6 inch  bed blocks),  Smoking cessation, avoidance of late meals, excessive alcohol, and avoid fatty foods, chocolate, peppermint, colas, red wine, and acidic juices such as orange juice.  NO MINT OR MENTHOL PRODUCTS SO NO COUGH DROPS   USE SUGARLESS CANDY INSTEAD (Jolley ranchers or Stover's or Life Savers) or even ice chips will also do - the key is to swallow to prevent all throat clearing. NO OIL BASED VITAMINS - use powdered substitutes.     Try symbicort 80 Take 2 puffs first thing in am and then another 2 puffs about 12 hours later.    If better continue symbiocort 80 indefinitely and if not next step is Dr Joya Gaskins  - follow up here is as needed

## 2017-09-12 NOTE — Telephone Encounter (Signed)
I am not familiar with this med - if she thinks it's helping she should look at the name on the bottle and ask this provider for refills or f/u as approp

## 2017-09-12 NOTE — Telephone Encounter (Signed)
lmtcb x2 for pt. 

## 2017-09-13 MED ORDER — PILOCARPINE HCL 5 MG PO TABS: 5.0000 mg | ORAL_TABLET | Freq: Two times a day (BID) | ORAL | 0 refills | Status: DC

## 2017-09-13 NOTE — Telephone Encounter (Signed)
Rx has been sent in. I have left a message with the pt making her aware of this.

## 2017-10-06 ENCOUNTER — Ambulatory Visit: Payer: BC Managed Care – PPO | Admitting: Internal Medicine

## 2017-10-06 ENCOUNTER — Encounter: Payer: Self-pay | Admitting: Internal Medicine

## 2017-10-06 DIAGNOSIS — N3001 Acute cystitis with hematuria: Secondary | ICD-10-CM

## 2017-10-06 DIAGNOSIS — N390 Urinary tract infection, site not specified: Secondary | ICD-10-CM | POA: Insufficient documentation

## 2017-10-06 LAB — POCT URINALYSIS DIPSTICK
Nitrite, UA: POSITIVE
PH UA: 5 (ref 5.0–8.0)
Protein, UA: NEGATIVE
SPEC GRAV UA: 1.025 (ref 1.010–1.025)

## 2017-10-06 MED ORDER — NITROFURANTOIN MONOHYD MACRO 100 MG PO CAPS: 100.0000 mg | ORAL_CAPSULE | Freq: Two times a day (BID) | ORAL | 0 refills | Status: DC

## 2017-10-06 NOTE — Assessment & Plan Note (Signed)
Rx for macrobid after U/A in the office with signs of infection.

## 2017-10-06 NOTE — Addendum Note (Signed)
Addended by: Raford Pitcher R on: 10/06/2017 11:39 AM   Modules accepted: Orders

## 2017-10-06 NOTE — Progress Notes (Signed)
   Subjective:    Patient ID: Deborah Ortega, female    DOB: 1966-08-12, 52 y.o.   MRN: [id number]  HPI The patient is a 52 YO female coming in for possible UTI. She is having pain on urination and some urgency. She was just treated for sickness earlier with antibiotics and is not sure if this is related. This was azithromycin. She denies fevers or chills. No abdominal or back pain. She denies nausea or vomiting. No blood in urine. Feels better from the cold last week.   Review of Systems  Constitutional: Negative.   Respiratory: Negative for cough, chest tightness and shortness of breath.   Cardiovascular: Negative for chest pain, palpitations and leg swelling.  Gastrointestinal: Negative for abdominal distention, abdominal pain, constipation, diarrhea, nausea and vomiting.  Genitourinary: Positive for dysuria, frequency and urgency. Negative for flank pain, genital sores, hematuria, menstrual problem and pelvic pain.  Musculoskeletal: Negative.   Skin: Negative.   Neurological: Negative.       Objective:   Physical Exam  Constitutional: She is oriented to person, place, and time. She appears well-developed and well-nourished.  HENT:  Head: Normocephalic and atraumatic.  Eyes: EOM are normal.  Neck: Normal range of motion.  Cardiovascular: Normal rate and regular rhythm.  Pulmonary/Chest: Effort normal and breath sounds normal. No respiratory distress. She has no wheezes. She has no rales.  Abdominal: Soft. Bowel sounds are normal. She exhibits no distension. There is no tenderness. There is no rebound.  Musculoskeletal: She exhibits no edema.  Neurological: She is alert and oriented to person, place, and time. Coordination normal.  Skin: Skin is warm and dry.   Vitals:   10/06/17 1051  BP: 98/78  Pulse: 84  Temp: 97.8 F (36.6 C)  TempSrc: Oral  SpO2: 97%  Weight: 127 lb (57.6 kg)  Height: 5' 3.5" (1.613 m)      Assessment & Plan:

## 2017-10-06 NOTE — Patient Instructions (Signed)
We have sent in the macrobid to take for the bladder infection. Take 1 pill twice a day for 1 week.   Call us back if you are not improved.

## 2017-10-07 ENCOUNTER — Ambulatory Visit: Payer: Self-pay

## 2017-10-07 NOTE — Telephone Encounter (Signed)
Called patient and told her that it would take time for the ABX to help and she states that she is not sure that she can go any longer with the pain that happens when she goes to the bathroom. It makes her grip on to the side of the tub. When I asked her what she was wanting (another ABX or what not) patient started to cry and stated "I do not know I just can't go much longer like this, I have taken 3 doses of ABX so far 2 yesterday and 1 today with meals and nothing has helped relieve it."

## 2017-10-07 NOTE — Telephone Encounter (Signed)
Pt calling back due to symptoms have not improved. Pt was seen for same symptoms as she is having today: dysuria, frequency and urgency. Pt stated that she usually feels relief by the 2nd day on Abx and is not feeling any better. Pt is taking OTC AZO and not getting mush relief. Home care given that stated that it could take 72 hours before sx start to improve. Pt asking if something could be called in for something stronger than AZO.   Reason for Disposition . [1] Taking antibiotic < 72 hours (3 days) for UTI AND [2] painful urination or frequency not improved  Answer Assessment - Initial Assessment Questions 1. ANTIBIOTIC: "What antibiotic are you taking?" "How many times per day?"     Macrobid- twice a day 2. DURATION: "When was the antibiotic started?"     yesterday 3. MAIN SYMPTOM: "What is the main symptom you are concerned about?"     Urgency, voiding a few drops same sx as yesterday 4. FEVER: "Do you have a fever?" If so, ask: "What is it, how was it measured, and when did it start?"     No fever 5. OTHER SYMPTOMS: "Do you have any other symptoms?" (e.g., flank pain, vaginal discharge, blood in urine)     No flank pain  Protocols used: URINARY TRACT INFECTION ON ANTIBIOTIC FOLLOW-UP CALL - FEMALE-A-AH

## 2017-10-07 NOTE — Telephone Encounter (Signed)
Yes, this is the same thing. She can try ibuprofen or tylenol.

## 2017-10-07 NOTE — Telephone Encounter (Signed)
States she is currently taking Phenazopyridine extra strength Since Wednesday wants to know if this is that same thing as the pyridium. States she really just does not want to be in this pain anymore

## 2017-10-07 NOTE — Telephone Encounter (Signed)
She can try pyridium over the counter which can help with pain.

## 2017-10-07 NOTE — Telephone Encounter (Signed)
We talked at the visit and there are no medications that can help with pressure. Most medications just help with the burning pain while urinating which she did not have at the visit. Keep taking antibiotics and increase fluids.

## 2017-10-07 NOTE — Telephone Encounter (Signed)
Contacted patient and informed of MD response says she will call back Saturday clinic if she is not feeling better

## 2017-10-10 ENCOUNTER — Telehealth: Payer: Self-pay | Admitting: Internal Medicine

## 2017-10-10 MED ORDER — FLUCONAZOLE 150 MG PO TABS: 150.0000 mg | ORAL_TABLET | Freq: Once | ORAL | 0 refills | Status: AC

## 2017-10-10 NOTE — Telephone Encounter (Signed)
Patient informed Rx was sent to [facility]

## 2017-10-10 NOTE — Telephone Encounter (Signed)
Sent in diflucan to take.

## 2017-10-10 NOTE — Telephone Encounter (Signed)
Copied from [facility]. Topic: General - Other >> Oct 10, 2017 10:00 AM Deborah Ortega wrote: Reason for CRM: patient calling stating that she was given an antibiotic now she had notice Sunday that she has a yeast infection

## 2017-12-06 ENCOUNTER — Other Ambulatory Visit: Payer: Self-pay | Admitting: Obstetrics and Gynecology

## 2017-12-06 DIAGNOSIS — Z1231 Encounter for screening mammogram for malignant neoplasm of breast: Secondary | ICD-10-CM

## 2017-12-06 LAB — BASIC METABOLIC PANEL: Glucose: 78

## 2017-12-06 LAB — VITAMIN D 25 HYDROXY (VIT D DEFICIENCY, FRACTURES): Vit D, 25-Hydroxy: 40

## 2017-12-06 LAB — LIPID PANEL
CHOLESTEROL: 227 — AB (ref 0–200)
HDL: 51 (ref 35–70)
LDL CALC: 157
TRIGLYCERIDES: 85 (ref 40–160)

## 2017-12-07 ENCOUNTER — Encounter: Payer: Self-pay | Admitting: Internal Medicine

## 2017-12-07 LAB — HM PAP SMEAR

## 2017-12-07 NOTE — Progress Notes (Signed)
Abstracted and sent to scan  

## 2017-12-14 ENCOUNTER — Ambulatory Visit: Payer: BC Managed Care – PPO | Admitting: Endocrinology

## 2017-12-30 ENCOUNTER — Ambulatory Visit: Payer: BC Managed Care – PPO

## 2018-01-02 ENCOUNTER — Ambulatory Visit (INDEPENDENT_AMBULATORY_CARE_PROVIDER_SITE_OTHER): Payer: BC Managed Care – PPO | Admitting: Endocrinology

## 2018-01-02 ENCOUNTER — Encounter: Payer: Self-pay | Admitting: Endocrinology

## 2018-01-02 VITALS — BP 98/62 | HR 87 | Wt 127.6 lb

## 2018-01-02 DIAGNOSIS — E041 Nontoxic single thyroid nodule: Secondary | ICD-10-CM

## 2018-01-02 NOTE — Progress Notes (Signed)
Subjective:    Patient ID: Deborah Ortega, female    DOB: Aug 07, 1966, 52 y.o.   MRN: [id number]  HPI Pt returns for f/u of left thyroid nodule (dx'ed 2003, when she had benign bx).  pt states she feels well in general.  She does not notice the nodule Past Medical History:  Diagnosis Date  . ALLERGIC RHINITIS   . Anemia   . Anxiety   . Asthma    being worked up for asthma now. Chronic cough  . Depression    takes lexapro  . GERD   . History of kidney stones   . HOH (hard of hearing)    both ears  . 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 MIGRAINE HEADACHE   . PONV (postoperative nausea and vomiting)   . RENAL CALCULUS, HX OF 06/25/2007, 02/2013  . THYROID NODULE     Past Surgical History:  Procedure Laterality Date  . BREAST BIOPSY Right 10/29/2013  . BREAST LUMPECTOMY WITH RADIOACTIVE SEED LOCALIZATION Right 11/19/2013   Procedure: BREAST LUMPECTOMY WITH RADIOACTIVE SEED LOCALIZATION;  Surgeon: Adin Hector, MD;  Location: [facility];  Service: General;  Laterality: Right;  . CESAREAN SECTION     x's 2  . Meninogioma Resection  07/25/09   pariatal area head-crainiotomy-plate  . WISDOM TOOTH EXTRACTION      Social History   Socioeconomic History  . Marital status: Married    Spouse name: Not on file  . Number of children: Not on file  . Years of education: Not on file  . Highest education level: Not on file  Occupational History  . Not on file  Social Needs  . Financial resource strain: Not on file  . Food insecurity:    Worry: Not on file    Inability: Not on file  . Transportation needs:    Medical: Not on file    Non-medical: Not on file  Tobacco Use  . Smoking status: Never Smoker  . Smokeless tobacco: Never Used  . Tobacco comment: Married, lives with spouse and 2 kids. Pt is a Pharmacist, hospital  Substance and Sexual Activity  . Alcohol use: No  . Drug use: No  . Sexual activity: Not on file  Lifestyle  . Physical activity:    Days per week:  Not on file    Minutes per session: Not on file  . Stress: Not on file  Relationships  . Social connections:    Talks on phone: Not on file    Gets together: Not on file    Attends religious service: Not on file    Active member of club or organization: Not on file    Attends meetings of clubs or organizations: Not on file    Relationship status: Not on file  . Intimate partner violence:    Fear of current or ex partner: Not on file    Emotionally abused: Not on file    Physically abused: Not on file    Forced sexual activity: Not on file  Other Topics Concern  . Not on file  Social History Narrative  . Not on file    Current Outpatient Medications on File Prior to Visit  Medication Sig Dispense Refill  . Calcium Carb-Cholecalciferol (CALCIUM-VITAMIN D) 500-200 MG-UNIT tablet Take 1 tablet by mouth daily.    . cetirizine (ZYRTEC) 10 MG tablet Take 10 mg by mouth at bedtime.     . chlorpheniramine (CHLOR-TRIMETON) 4 MG tablet Take 4  mg by mouth at bedtime. May add 1 extra every 4 hours as needed for drippy nose, drainage, throat clearing.    . docusate sodium (COLACE) 100 MG capsule Take 100 mg by mouth at bedtime.     Marland Kitchen escitalopram (LEXAPRO) 20 MG tablet Take 20 mg by mouth daily.    . ferrous sulfate 325 (65 FE) MG tablet Take 325 mg by mouth at bedtime.    Marland Kitchen omeprazole (PRILOSEC) 40 MG capsule Take 1 capsule (40 mg total) by mouth 2 (two) times daily. 180 capsule 1  . pilocarpine (SALAGEN) 5 MG tablet Take 1 tablet (5 mg total) by mouth 2 (two) times daily. 60 tablet 0  . benzonatate (TESSALON) 200 MG capsule Take 200 mg by mouth 4 (four) times daily as needed for cough.    . budesonide-formoterol (SYMBICORT) 80-4.5 MCG/ACT inhaler Inhale 2 puffs into the lungs 2 (two) times daily. (Patient not taking: Reported on 01/02/2018) 1 Inhaler 11  . gabapentin (NEURONTIN) 100 MG capsule Take 3 daily at bedtime    . nitrofurantoin, macrocrystal-monohydrate, (MACROBID) 100 MG capsule Take  1 capsule (100 mg total) by mouth 2 (two) times daily. (Patient not taking: Reported on 01/02/2018) 14 capsule 0   No current facility-administered medications on file prior to visit.     Allergies  Allergen Reactions  . Ibuprofen Rash  . Buprenorphine Hcl Itching  . Levofloxacin     Cannot sleep  . Morphine And Related Itching  . Tramadol     Extreme sleeplessness/vomiting- can tolerate small dose per pt  . Adhesive [Tape] Rash  . Aleve [Naproxen Sodium] Rash  . Latex Rash  . Phenylephrine-Dm Anxiety    Family History  Problem Relation Age of Onset  . Diabetes Other   . Hyperlipidemia Other   . Stroke Other   . Ulcerative colitis Other   . Asthma Cousin     BP 98/62   Pulse 87   Wt 127 lb 9.6 oz (57.9 kg)   SpO2 98%   BMI 22.25 kg/m    Review of Systems Denies neck pain    Objective:   Physical Exam VITAL SIGNS:  See vs page GENERAL: no distress NECK: A 2 cm left thyroid nodule is again noted.  It is freely mobile.  No lymphadenopathy.    Lab Results  Component Value Date   TSH 0.63 04/01/2017      Assessment & Plan:  thyroid nodule: due for recehck Borderline low TSH: we discussed her risk for developing hyperthyroidism.  Patient Instructions  Let's recheck the ultrasound.  you will receive a phone call, about a day and time for an appointment. If as expected, there is no change, Please come back for a follow-up appointment in 2 years. most of the time, a "lumpy thyroid" will eventually become overactive.  this is usually a slow process, happening over the span of many years. If it becomes overactive, you should consider taking the radioactive iodine pill:  let's check a thyroid "scan" (a special, but easy and painless type of thyroid x ray).  It works like this: you go to the x-ray department of the hospital to swallow a pill, which contains a miniscule amount of radiation.  You will not notice any symptoms from this.  You will go back to the x-ray  department the next day, to lie down in front of a camera.  The results of this will be sent to me.   Based on the results, i hope to  order for you a treatment pill of radioactive iodine.  Although it is a larger amount of radiation, you will again notice no symptoms from this.  The pill is gone from your body in a few days (during which you should stay away from other people), but takes several months to work.  Therefore, please return here approximately 6-8 weeks after the treatment.  This treatment has been available for many years, and the only known side-effect is an underactive thyroid.  It is possible that i would eventually prescribe for you a thyroid hormone pill, which is very inexpensive.  You don't have to worry about side-effects of this thyroid hormone pill, because it is the same molecule your thyroid makes.

## 2018-01-02 NOTE — Patient Instructions (Addendum)
Let's recheck the ultrasound.  you will receive a phone call, about a day and time for an appointment. If as expected, there is no change, Please come back for a follow-up appointment in 2 years. most of the time, a "lumpy thyroid" will eventually become overactive.  this is usually a slow process, happening over the span of many years. If it becomes overactive, you should consider taking the radioactive iodine pill:  let's check a thyroid "scan" (a special, but easy and painless type of thyroid x ray).  It works like this: you go to the x-ray department of the hospital to swallow a pill, which contains a miniscule amount of radiation.  You will not notice any symptoms from this.  You will go back to the x-ray department the next day, to lie down in front of a camera.  The results of this will be sent to me.   Based on the results, i hope to order for you a treatment pill of radioactive iodine.  Although it is a larger amount of radiation, you will again notice no symptoms from this.  The pill is gone from your body in a few days (during which you should stay away from other people), but takes several months to work.  Therefore, please return here approximately 6-8 weeks after the treatment.  This treatment has been available for many years, and the only known side-effect is an underactive thyroid.  It is possible that i would eventually prescribe for you a thyroid hormone pill, which is very inexpensive.  You don't have to worry about side-effects of this thyroid hormone pill, because it is the same molecule your thyroid makes.

## 2018-01-04 ENCOUNTER — Ambulatory Visit
Admission: RE | Admit: 2018-01-04 | Discharge: 2018-01-04 | Disposition: A | Discharge status: Home / Self Care | Payer: BC Managed Care – PPO | Source: Ambulatory Visit | Attending: Obstetrics and Gynecology | Admitting: Obstetrics and Gynecology

## 2018-01-04 DIAGNOSIS — Z1231 Encounter for screening mammogram for malignant neoplasm of breast: Secondary | ICD-10-CM

## 2018-01-25 ENCOUNTER — Other Ambulatory Visit: Payer: BC Managed Care – PPO

## 2018-01-25 ENCOUNTER — Ambulatory Visit
Admission: RE | Admit: 2018-01-25 | Discharge: 2018-01-25 | Disposition: A | Discharge status: Home / Self Care | Payer: BC Managed Care – PPO | Source: Ambulatory Visit | Attending: Endocrinology | Admitting: Endocrinology

## 2018-01-25 DIAGNOSIS — E041 Nontoxic single thyroid nodule: Secondary | ICD-10-CM

## 2018-03-30 ENCOUNTER — Other Ambulatory Visit: Payer: Self-pay

## 2018-03-30 ENCOUNTER — Emergency Department (HOSPITAL_COMMUNITY)
Admission: EM | Admit: 2018-03-30 | Discharge: 2018-03-31 | Disposition: A | Discharge status: Home / Self Care | Payer: BC Managed Care – PPO | Attending: Emergency Medicine | Admitting: Emergency Medicine

## 2018-03-30 ENCOUNTER — Emergency Department (HOSPITAL_COMMUNITY): Payer: BC Managed Care – PPO

## 2018-03-30 ENCOUNTER — Encounter (HOSPITAL_COMMUNITY): Payer: Self-pay | Admitting: Emergency Medicine

## 2018-03-30 DIAGNOSIS — Z79899 Other long term (current) drug therapy: Secondary | ICD-10-CM | POA: Diagnosis not present

## 2018-03-30 DIAGNOSIS — Z91040 Latex allergy status: Secondary | ICD-10-CM | POA: Diagnosis not present

## 2018-03-30 DIAGNOSIS — Y939 Activity, unspecified: Secondary | ICD-10-CM | POA: Diagnosis not present

## 2018-03-30 DIAGNOSIS — Y92019 Unspecified place in single-family (private) house as the place of occurrence of the external cause: Secondary | ICD-10-CM | POA: Insufficient documentation

## 2018-03-30 DIAGNOSIS — W108XXA Fall (on) (from) other stairs and steps, initial encounter: Secondary | ICD-10-CM | POA: Insufficient documentation

## 2018-03-30 DIAGNOSIS — J45909 Unspecified asthma, uncomplicated: Secondary | ICD-10-CM | POA: Diagnosis not present

## 2018-03-30 DIAGNOSIS — Y999 Unspecified external cause status: Secondary | ICD-10-CM | POA: Diagnosis not present

## 2018-03-30 DIAGNOSIS — S99911A Unspecified injury of right ankle, initial encounter: Secondary | ICD-10-CM | POA: Diagnosis present

## 2018-03-30 DIAGNOSIS — S82844A Nondisplaced bimalleolar fracture of right lower leg, initial encounter for closed fracture: Secondary | ICD-10-CM | POA: Insufficient documentation

## 2018-03-30 DIAGNOSIS — S82841A Displaced bimalleolar fracture of right lower leg, initial encounter for closed fracture: Secondary | ICD-10-CM

## 2018-03-30 MED ORDER — IBUPROFEN 600 MG PO TABS: 600.0000 mg | ORAL_TABLET | Freq: Four times a day (QID) | ORAL | 0 refills | Status: DC | PRN

## 2018-03-30 MED ORDER — OXYCODONE-ACETAMINOPHEN 5-325 MG PO TABS
1.0000 | ORAL_TABLET | ORAL | Status: AC | PRN
  Administered 2018-03-30 (×2): 1 via ORAL
  Filled 2018-03-30 (×2): qty 1

## 2018-03-30 MED ORDER — ACETAMINOPHEN 500 MG PO TABS: 500.0000 mg | ORAL_TABLET | Freq: Four times a day (QID) | ORAL | 0 refills | Status: AC | PRN

## 2018-03-30 MED ORDER — OXYCODONE HCL 5 MG PO TABS: 5.0000 mg | ORAL_TABLET | Freq: Four times a day (QID) | ORAL | 0 refills | Status: DC | PRN

## 2018-03-30 NOTE — ED Notes (Signed)
Please see Emergency Provider's history and physical for full assessment.

## 2018-03-30 NOTE — ED Triage Notes (Signed)
Pt fell down 3 steps at home landing on hardwood floor.  No LOC. Pt has pain from mid-calf on right leg to foot and ankle. Tearful in triage.

## 2018-03-30 NOTE — ED Notes (Signed)
Ortho paged. 

## 2018-03-30 NOTE — Discharge Instructions (Signed)
Alternate 600 mg of ibuprofen and [phone] mg of Tylenol every 3 hours as needed for pain. Do not exceed 4000 mg of Tylenol daily.  Take ibuprofen with food to avoid upset stomach issues.  You can take oxycodone as needed for severe pain but do not drive, drink alcohol, or operate heavy machinery while taking this medicine as it may make you drowsy.  Elevate the extremity when you are not walking on it.  Use the crutches to help you get around and avoid bearing weight on the leg.  Keep the splint clean and dry.  Cover it in a bag that you have taped when you are bathing to avoid getting it wet.  You will be wearing the splint at all times.  Call Dr. Tama Headings office tomorrow morning to set up an appointment for Monday or Tuesday.  He did mention that he is not in network with your insurance, they will also be able to help refer you to an orthopedic surgeon that is in your network.  You can also call the number on the back of this packet in the black box.  Return to the emergency department immediately for any concerning signs or symptoms develop such as worsening swelling, loss of pulses, fevers, redness of the joints.

## 2018-03-30 NOTE — ED Provider Notes (Addendum)
[facility] EMERGENCY DEPARTMENT Provider Note   CSN: [id number] Arrival date & time: 03/30/18  2103     History   Chief Complaint Chief Complaint  Patient presents with  . Fall  . Leg Injury    HPI Deborah Ortega is a 52 y.o. female with history of anemia, anxiety, depression, GERD, nephrolithiasis, migraine headaches presents today for evaluation of acute onset, constant right lower extremity pain secondary to injury earlier today.  Patient states that she lives in a split-level home and was attempting to walk down 3 steps when her left foot slipped on the last step causing her to put all of her weight on her right lower extremity, inverting her ankle, and landing on her right side.  She denies head injury or loss of consciousness.  She notes constant pain from the mid calf to the foot.  Pain worsens with palpation and any attempts at movement.  She states she has not been able to bear weight since the fall.  Endorses numbness and tingling of the right heel extending to the toes.  Denies back pain, headaches, or neck pain.  Denies knee pain.  Some improvement with Percocet given in triage.   The history is provided by the patient.    Past Medical History:  Diagnosis Date  . ALLERGIC RHINITIS   . Anemia   . Anxiety   . Asthma    being worked up for asthma now. Chronic cough  . Depression    takes lexapro  . GERD   . History of kidney stones   . HOH (hard of hearing)    both ears  . 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 MIGRAINE HEADACHE   . PONV (postoperative nausea and vomiting)   . RENAL CALCULUS, HX OF 06/25/2007, 02/2013  . THYROID NODULE     Patient Active Problem List   Diagnosis Date Noted  . UTI (urinary tract infection) 10/06/2017  . Anemia 03/31/2017  . History of brain tumor 03/31/2017  . Routine general medical examination at a health care facility 12/15/2014  . Cough variant asthma 07/08/2014  . 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 Upper airway cough syndrome 02/06/2013  . THYROID NODULE 07/16/2009  . ALLERGIC RHINITIS 07/15/2009  . GERD 07/15/2009    Past Surgical History:  Procedure Laterality Date  . BREAST BIOPSY Right 10/29/2013  . BREAST LUMPECTOMY WITH RADIOACTIVE SEED LOCALIZATION Right 11/19/2013   Procedure: BREAST LUMPECTOMY WITH RADIOACTIVE SEED LOCALIZATION;  Surgeon: Adin Hector, MD;  Location: [place];  Service: General;  Laterality: Right;  . CESAREAN SECTION     x's 2  . Meninogioma Resection  07/25/09   pariatal area head-crainiotomy-plate  . WISDOM TOOTH EXTRACTION       OB History   None      Home Medications    Prior to Admission medications   Medication Sig Start Date End Date Taking? Authorizing Provider  acetaminophen (TYLENOL) 500 MG tablet Take 1 tablet (500 mg total) by mouth every 6 (six) hours as needed. 03/30/18   Alyvia Derk A, PA-C  benzonatate (TESSALON) 200 MG capsule Take 200 mg by mouth 4 (four) times daily as needed for cough.    [provider]  budesonide-formoterol (SYMBICORT) 80-4.5 MCG/ACT inhaler Inhale 2 puffs into the lungs 2 (two) times daily. Patient not taking: Reported on 01/02/2018 05/27/17   Tanda Rockers, MD  Calcium Carb-Cholecalciferol (CALCIUM-VITAMIN D) 500-200 MG-UNIT tablet Take 1  tablet by mouth daily.    [provider]  cetirizine (ZYRTEC) 10 MG tablet Take 10 mg by mouth at bedtime.     [provider]  chlorpheniramine (CHLOR-TRIMETON) 4 MG tablet Take 4 mg by mouth at bedtime. May add 1 extra every 4 hours as needed for drippy nose, drainage, throat clearing.    [provider]  docusate sodium (COLACE) 100 MG capsule Take 100 mg by mouth at bedtime.     [provider]  escitalopram (LEXAPRO) 20 MG tablet Take 20 mg by mouth daily.    [provider]  ferrous sulfate 325 (65 FE) MG tablet Take 325 mg by mouth at bedtime.    [provider]  gabapentin  (NEURONTIN) 100 MG capsule Take 3 daily at bedtime    [provider]  ibuprofen (ADVIL,MOTRIN) 600 MG tablet Take 1 tablet (600 mg total) by mouth every 6 (six) hours as needed. 03/30/18   Ethelyne Erich A, PA-C  nitrofurantoin, macrocrystal-monohydrate, (MACROBID) 100 MG capsule Take 1 capsule (100 mg total) by mouth 2 (two) times daily. Patient not taking: Reported on 01/02/2018 10/06/17   Hoyt Koch, MD  omeprazole (PRILOSEC) 40 MG capsule Take 1 capsule (40 mg total) by mouth 2 (two) times daily. 05/27/17   Tanda Rockers, MD  oxyCODONE (ROXICODONE) 5 MG immediate release tablet Take 1 tablet (5 mg total) by mouth every 6 (six) hours as needed for severe pain. 03/30/18   Jonda Alanis A, PA-C  pilocarpine (SALAGEN) 5 MG tablet Take 1 tablet (5 mg total) by mouth 2 (two) times daily. 09/13/17   Tanda Rockers, MD    Family History Family History  Problem Relation Age of Onset  . Diabetes Other   . Hyperlipidemia Other   . Stroke Other   . Ulcerative colitis Other   . Asthma Cousin     Social History Social History   Tobacco Use  . Smoking status: Never Smoker  . Smokeless tobacco: Never Used  . Tobacco comment: Married, lives with spouse and 2 kids. Pt is a Pharmacist, hospital  Substance Use Topics  . Alcohol use: No  . Drug use: No     Allergies   Ibuprofen; Buprenorphine hcl; Levofloxacin; Morphine and related; Tramadol; Adhesive [tape]; Aleve [naproxen sodium]; Latex; and Phenylephrine-dm   Review of Systems Review of Systems  Musculoskeletal: Positive for arthralgias. Negative for back pain and neck pain.  Neurological: Positive for numbness. Negative for syncope and weakness.     Physical Exam Updated Vital Signs BP (!) 102/59 (BP Location: Right Arm)   Pulse 70   Temp 98.2 F (36.8 C) (Oral)   Resp 17   SpO2 100%   Physical Exam  Constitutional: She appears well-developed and well-nourished. No distress.  Resting in right lateral decubitus position,  appears uncomfortable  HENT:  Head: Normocephalic and atraumatic.  Eyes: Conjunctivae are normal. Right eye exhibits no discharge. Left eye exhibits no discharge.  Neck: No JVD present. No tracheal deviation present.  Cardiovascular: Normal rate and intact distal pulses.  2+ DP/PT pulses bilaterally  Pulmonary/Chest: Effort normal.  Abdominal: She exhibits no distension.  Musculoskeletal: She exhibits no edema.  Mild swelling of the right ankle.  Tender to palpation overlying the distal fibula and medial malleolus with mild swelling and ecchymosis.  5/5 strength with plantarflexion and dorsiflexion as well as eversion and inversion of the ankle despite pain.  EHL strength intact.  No proximal fibula tenderness, examination of the right  knee otherwise unremarkable.  No ligamentous laxity noted.  Examination of the Achilles tendon is within normal limits.  Neurological: She is alert.  Fluent speech with no evidence of dysarthria or aphasia, no facial droop, sensation intact to soft touch of bilateral lower extremities.  Unable to ambulate secondary to pain  Skin: Skin is warm and dry. No erythema.  Psychiatric: She has a normal mood and affect. Her behavior is normal.  Nursing note and vitals reviewed.    ED Treatments / Results  Labs (all labs ordered are listed, but only abnormal results are displayed) Labs Reviewed - No data to display  EKG None  Radiology Dg Tibia/fibula Right  Result Date: 03/30/2018 CLINICAL DATA:  52 y/o F; right knee to right foot pain. Fall down stairs. EXAM: RIGHT TIBIA AND FIBULA - 2 VIEW; RIGHT FOOT COMPLETE - 3+ VIEW; RIGHT ANKLE - COMPLETE 3+ VIEW COMPARISON:  None. FINDINGS: Right tibia and fibula: Trimalleolar ankle fracture as described below. The knee joint is well maintained. No additional tibia or fibular fracture identified. Right ankle: Minimally displaced acute fractures of the medial malleolus and posterior malleolus. Mildly displaced fracture of  the lower fibula above level of tibial plafond with 3 mm of lateral and posterior displacement of the lower fracture component. Talar dome is intact. Widening of the medial ankle mortise. Right foot: No acute fracture or dislocation identified in the foot. Lisfranc alignment is maintained. IMPRESSION: 1. Minimally displaced acute fractures of the medial malleolus and posterior malleolus. 2. Mildly displaced fracture of the lower fibula above level of tibial plafond with 3 mm lateral and posterior displacement of the lower fracture component. 3. Widening of medial ankle mortise. Electronically Signed   By: Kristine Garbe M.D.   On: 03/30/2018 22:23   Dg Ankle Complete Right  Result Date: 03/30/2018 CLINICAL DATA:  52 y/o F; right knee to right foot pain. Fall down stairs. EXAM: RIGHT TIBIA AND FIBULA - 2 VIEW; RIGHT FOOT COMPLETE - 3+ VIEW; RIGHT ANKLE - COMPLETE 3+ VIEW COMPARISON:  None. FINDINGS: Right tibia and fibula: Trimalleolar ankle fracture as described below. The knee joint is well maintained. No additional tibia or fibular fracture identified. Right ankle: Minimally displaced acute fractures of the medial malleolus and posterior malleolus. Mildly displaced fracture of the lower fibula above level of tibial plafond with 3 mm of lateral and posterior displacement of the lower fracture component. Talar dome is intact. Widening of the medial ankle mortise. Right foot: No acute fracture or dislocation identified in the foot. Lisfranc alignment is maintained. IMPRESSION: 1. Minimally displaced acute fractures of the medial malleolus and posterior malleolus. 2. Mildly displaced fracture of the lower fibula above level of tibial plafond with 3 mm lateral and posterior displacement of the lower fracture component. 3. Widening of medial ankle mortise. Electronically Signed   By: Kristine Garbe M.D.   On: 03/30/2018 22:23   Dg Foot Complete Right  Result Date: 03/30/2018 CLINICAL DATA:   52 y/o F; right knee to right foot pain. Fall down stairs. EXAM: RIGHT TIBIA AND FIBULA - 2 VIEW; RIGHT FOOT COMPLETE - 3+ VIEW; RIGHT ANKLE - COMPLETE 3+ VIEW COMPARISON:  None. FINDINGS: Right tibia and fibula: Trimalleolar ankle fracture as described below. The knee joint is well maintained. No additional tibia or fibular fracture identified. Right ankle: Minimally displaced acute fractures of the medial malleolus and posterior malleolus. Mildly displaced fracture of the lower fibula above level of tibial plafond with 3 mm of lateral and  posterior displacement of the lower fracture component. Talar dome is intact. Widening of the medial ankle mortise. Right foot: No acute fracture or dislocation identified in the foot. Lisfranc alignment is maintained. IMPRESSION: 1. Minimally displaced acute fractures of the medial malleolus and posterior malleolus. 2. Mildly displaced fracture of the lower fibula above level of tibial plafond with 3 mm lateral and posterior displacement of the lower fracture component. 3. Widening of medial ankle mortise. Electronically Signed   By: Kristine Garbe M.D.   On: 03/30/2018 22:23    Procedures Procedures (including critical care time)  Medications Ordered in ED Medications  oxyCODONE-acetaminophen (PERCOCET/ROXICET) 5-325 MG per tablet 1 tablet (1 tablet Oral Given 03/30/18 2320)     Initial Impression / Assessment and Plan / ED Course  I have reviewed the triage vital signs and the nursing notes.  Pertinent labs & imaging results that were available during my care of the patient were reviewed by me and considered in my medical decision making (see chart for details).     Patient with right lower extremity pain secondary to mechanical fall earlier this evening.  She is afebrile, vital signs are stable.  She is nontoxic in appearance.  She is neurovascularly intact.  Compartments are soft, no concern for compartment syndrome. She has focal tenderness to  palpation of the medial and lateral malleoli on exam.  Radiographs show minimally displaced acute fractures of the medial malleolus and posterior malleolus, mildly displaced fracture of the lower fibula above level of tibial plafond with 3 mm lateral and posterior displacement of the lower fracture component, and widening of medial ankle mortise.  Pain controlled in the ED.  Unable to ambulate patient secondary to pain and underlying fractures. 10:42 PM Spoke with Dr. Doreatha Martin with orthopedic surgery, who recommends short leg splint and follow-up in his office early next week on Monday or Tuesday.  He does state that the fractures will eventually require surgical repair.  He notes that he is out of network with the patient's insurance but states that his office will be able to help provide resources for follow-up with an orthopedist within her network if that is what she wishes to do.  Patient splinted by ortho tech, remains neurovascularly intact.  Given crutches.  Will discharge with NSAIDs, Tylenol, and oxycodone for severe breakthrough pain. RICE therapy indicated and discussed with patient.  Discussed strict ED return precautions. Pt verbalized understanding of and agreement with plan and is safe for discharge home at this time.  No complaints prior to discharge. Final Clinical Impressions(s) / ED Diagnoses   Final diagnoses:  Closed bimalleolar fracture of right ankle, initial encounter    ED Discharge Orders         Ordered    oxyCODONE (ROXICODONE) 5 MG immediate release tablet  Every 6 hours PRN     03/30/18 2333    acetaminophen (TYLENOL) 500 MG tablet  Every 6 hours PRN     03/30/18 2333    ibuprofen (ADVIL,MOTRIN) 600 MG tablet  Every 6 hours PRN     03/30/18 2333             Renita Papa, PA-C 03/31/18 0011    Valarie Merino, MD 03/31/18 [phone]

## 2018-04-04 ENCOUNTER — Encounter: Payer: BC Managed Care – PPO | Admitting: Internal Medicine

## 2018-04-05 ENCOUNTER — Ambulatory Visit (INDEPENDENT_AMBULATORY_CARE_PROVIDER_SITE_OTHER): Payer: BC Managed Care – PPO | Admitting: Orthopaedic Surgery

## 2018-04-13 HISTORY — PX: ANKLE SURGERY: SHX546

## 2018-06-13 ENCOUNTER — Other Ambulatory Visit (INDEPENDENT_AMBULATORY_CARE_PROVIDER_SITE_OTHER): Payer: BC Managed Care – PPO

## 2018-06-13 ENCOUNTER — Encounter: Payer: Self-pay | Admitting: Internal Medicine

## 2018-06-13 ENCOUNTER — Ambulatory Visit (INDEPENDENT_AMBULATORY_CARE_PROVIDER_SITE_OTHER): Payer: BC Managed Care – PPO | Admitting: Internal Medicine

## 2018-06-13 VITALS — BP 100/60 | HR 71 | Temp 97.8°F | Ht 63.5 in | Wt 125.0 lb

## 2018-06-13 DIAGNOSIS — D500 Iron deficiency anemia secondary to blood loss (chronic): Secondary | ICD-10-CM

## 2018-06-13 DIAGNOSIS — Z0000 Encounter for general adult medical examination without abnormal findings: Secondary | ICD-10-CM

## 2018-06-13 DIAGNOSIS — J45991 Cough variant asthma: Secondary | ICD-10-CM | POA: Diagnosis not present

## 2018-06-13 DIAGNOSIS — Z23 Encounter for immunization: Secondary | ICD-10-CM | POA: Diagnosis not present

## 2018-06-13 LAB — COMPREHENSIVE METABOLIC PANEL
ALBUMIN: 4.5 g/dL (ref 3.5–5.2)
ALT: 8 U/L (ref 0–35)
AST: 10 U/L (ref 0–37)
Alkaline Phosphatase: 58 U/L (ref 39–117)
BUN: 10 mg/dL (ref 6–23)
CHLORIDE: 106 meq/L (ref 96–112)
CO2: 26 meq/L (ref 19–32)
CREATININE: 0.64 mg/dL (ref 0.40–1.20)
Calcium: 9.1 mg/dL (ref 8.4–10.5)
GFR: 104.16 mL/min (ref >60.00)
GLUCOSE: 94 mg/dL (ref 70–99)
POTASSIUM: 3.8 meq/L (ref 3.5–5.1)
SODIUM: 139 meq/L (ref 135–145)
Total Bilirubin: 0.3 mg/dL (ref 0.2–1.2)
Total Protein: 7 g/dL (ref 6.0–8.3)

## 2018-06-13 LAB — CBC
HEMATOCRIT: 36.8 % (ref 36.0–46.0)
Hemoglobin: 11.9 g/dL — ABNORMAL LOW (ref 12.0–15.0)
MCHC: 32.4 g/dL (ref 30.0–36.0)
MCV: 79 fl (ref 78.0–100.0)
Platelets: 322 10*3/uL (ref 150.0–400.0)
RBC: 4.66 Mil/uL (ref 3.87–5.11)
RDW: 15.6 % — AB (ref 11.5–15.5)
WBC: 6.2 10*3/uL (ref 4.0–10.5)

## 2018-06-13 LAB — LIPID PANEL
CHOL/HDL RATIO: 4
CHOLESTEROL: 195 mg/dL (ref 0–200)
HDL: 45.4 mg/dL (ref >39.00)
LDL CALC: 124 mg/dL — AB (ref 0–99)
NONHDL: 149.86
Triglycerides: 130 mg/dL (ref 0.0–149.0)
VLDL: 26 mg/dL (ref 0.0–40.0)

## 2018-06-13 LAB — TSH: TSH: 1.02 u[IU]/mL (ref 0.35–4.50)

## 2018-06-13 NOTE — Progress Notes (Signed)
   Subjective:    Patient ID: Deborah Ortega, female    DOB: 21-Jun-1966, 52 y.o.   MRN: [id number]  HPI  The patient is a 52 YO female coming in for physical. Still struggling with ankle fracture which is keeping her out of work.   PMH, [facility], social history reviewed and updated  [facility] cologuard/pap/mammogram  Review of Systems  Constitutional: Negative.   HENT: Negative.   Eyes: Negative.   Respiratory: Negative for cough, chest tightness and shortness of breath.   Cardiovascular: Negative for chest pain, palpitations and leg swelling.  Gastrointestinal: Negative for abdominal distention, abdominal pain, constipation, diarrhea, nausea and vomiting.  Musculoskeletal: Positive for joint swelling.  Skin: Negative.   Neurological: Negative.   Psychiatric/Behavioral: Negative.       Objective:   Physical Exam  Constitutional: She is oriented to person, place, and time. She appears well-developed and well-nourished.  HENT:  Head: Normocephalic and atraumatic.  Eyes: EOM are normal.  Neck: Normal range of motion.  Cardiovascular: Normal rate and regular rhythm.  Pulmonary/Chest: Effort normal and breath sounds normal. No respiratory distress. She has no wheezes. She has no rales.  Abdominal: Soft. Bowel sounds are normal. She exhibits no distension. There is no tenderness. There is no rebound.  Musculoskeletal: She exhibits edema.  1+ edema right ankle   Neurological: She is alert and oriented to person, place, and time. Coordination normal.  Skin: Skin is warm and dry.  Psychiatric: She has a normal mood and affect.   Vitals:   06/13/18 0944  BP: 100/60  Pulse: 71  Temp: 97.8 F (36.6 C)  TempSrc: Oral  SpO2: 99%  Weight: 125 lb (56.7 kg)  Height: 5' 3.5" (1.613 m)      Assessment & Plan:  Flu shot given at visit

## 2018-06-13 NOTE — Assessment & Plan Note (Signed)
Checking CBC and adjust as needed.  

## 2018-06-13 NOTE — Assessment & Plan Note (Signed)
Doing well with symbicort as needed and allergy medication all the time.

## 2018-06-13 NOTE — Patient Instructions (Signed)

## 2018-06-13 NOTE — Assessment & Plan Note (Signed)
Flu shot given. Shingrix counseled. Tetanus up to date. Cologuard with gyn and will obtain copy. Mammogram up to date with gyn, pap smear up to date with gyn. Counseled about sun safety and mole surveillance. Counseled about the dangers of distracted driving. Given 10 year screening recommendations.

## 2018-06-30 ENCOUNTER — Encounter: Payer: Self-pay | Admitting: Internal Medicine

## 2018-06-30 NOTE — Progress Notes (Signed)
Abstracted and sent to scan  

## 2018-10-13 ENCOUNTER — Other Ambulatory Visit: Payer: Self-pay | Admitting: Obstetrics and Gynecology

## 2018-10-13 DIAGNOSIS — Z1231 Encounter for screening mammogram for malignant neoplasm of breast: Secondary | ICD-10-CM

## 2019-01-09 ENCOUNTER — Ambulatory Visit
Admission: RE | Admit: 2019-01-09 | Discharge: 2019-01-09 | Disposition: A | Discharge status: Home / Self Care | Payer: BC Managed Care – PPO | Source: Ambulatory Visit | Attending: Obstetrics and Gynecology | Admitting: Obstetrics and Gynecology

## 2019-01-09 ENCOUNTER — Other Ambulatory Visit: Payer: Self-pay

## 2019-01-09 DIAGNOSIS — Z1231 Encounter for screening mammogram for malignant neoplasm of breast: Secondary | ICD-10-CM

## 2019-01-30 DIAGNOSIS — H903 Sensorineural hearing loss, bilateral: Secondary | ICD-10-CM | POA: Insufficient documentation

## 2019-02-12 ENCOUNTER — Other Ambulatory Visit: Payer: Self-pay

## 2019-02-13 ENCOUNTER — Other Ambulatory Visit: Payer: Self-pay

## 2019-02-13 ENCOUNTER — Ambulatory Visit (INDEPENDENT_AMBULATORY_CARE_PROVIDER_SITE_OTHER): Payer: BC Managed Care – PPO | Admitting: Endocrinology

## 2019-02-13 ENCOUNTER — Encounter: Payer: Self-pay | Admitting: Endocrinology

## 2019-02-13 VITALS — BP 104/64 | HR 88 | Ht 63.5 in | Wt 127.0 lb

## 2019-02-13 DIAGNOSIS — E041 Nontoxic single thyroid nodule: Secondary | ICD-10-CM | POA: Diagnosis not present

## 2019-02-13 NOTE — Progress Notes (Signed)
Subjective:    Patient ID: Deborah Ortega, female    DOB: Aug 28, 1965, 53 y.o.   MRN: [id number]  HPI Pt returns for f/u of left thyroid nodule (dx'ed 2003, when she had benign bx; f/u US in 2019 showed mild enlargement of the solitary left thyroid nodule since 2016).  She does not notice the nodule.   Past Medical History:  Diagnosis Date  . ALLERGIC RHINITIS   . Anemia   . Anxiety   . Asthma    being worked up for asthma now. Chronic cough  . Depression    takes lexapro  . GERD   . History of kidney stones   . HOH (hard of hearing)    both ears  . 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 MIGRAINE HEADACHE   . PONV (postoperative nausea and vomiting)   . RENAL CALCULUS, HX OF 06/25/2007, 02/2013  . THYROID NODULE     Past Surgical History:  Procedure Laterality Date  . ANKLE SURGERY Right 04/13/2018  . BREAST BIOPSY Right 10/29/2013  . BREAST LUMPECTOMY WITH RADIOACTIVE SEED LOCALIZATION Right 11/19/2013   Procedure: BREAST LUMPECTOMY WITH RADIOACTIVE SEED LOCALIZATION;  Surgeon: Adin Hector, MD;  Location: [facility];  Service: General;  Laterality: Right;  . CESAREAN SECTION     x's 2  . Meninogioma Resection  07/25/09   pariatal area head-crainiotomy-plate  . WISDOM TOOTH EXTRACTION      Social History   Socioeconomic History  . Marital status: Married    Spouse name: Not on file  . Number of children: Not on file  . Years of education: Not on file  . Highest education level: Not on file  Occupational History  . Not on file  Social Needs  . Financial resource strain: Not on file  . Food insecurity    Worry: Not on file    Inability: Not on file  . Transportation needs    Medical: Not on file    Non-medical: Not on file  Tobacco Use  . Smoking status: Never Smoker  . Smokeless tobacco: Never Used  . Tobacco comment: Married, lives with spouse and 2 kids. Pt is a Pharmacist, hospital  Substance and Sexual Activity  . Alcohol use: No  . Drug use: No   . Sexual activity: Not on file  Lifestyle  . Physical activity    Days per week: Not on file    Minutes per session: Not on file  . Stress: Not on file  Relationships  . Social Herbalist on phone: Not on file    Gets together: Not on file    Attends religious service: Not on file    Active member of club or organization: Not on file    Attends meetings of clubs or organizations: Not on file    Relationship status: Not on file  . Intimate partner violence    Fear of current or ex partner: Not on file    Emotionally abused: Not on file    Physically abused: Not on file    Forced sexual activity: Not on file  Other Topics Concern  . Not on file  Social History Narrative  . Not on file    Current Outpatient Medications on File Prior to Visit  Medication Sig Dispense Refill  . acetaminophen (TYLENOL) 500 MG tablet Take 1 tablet (500 mg total) by mouth every 6 (six) hours as needed. 30 tablet 0  . budesonide-formoterol (SYMBICORT)  80-4.5 MCG/ACT inhaler Inhale 2 puffs into the lungs 2 (two) times daily. 1 Inhaler 11  . Calcium Carb-Cholecalciferol (CALCIUM-VITAMIN D) 500-200 MG-UNIT tablet Take 1 tablet by mouth daily.    . cetirizine (ZYRTEC) 10 MG tablet Take 10 mg by mouth at bedtime.     . docusate sodium (COLACE) 250 MG capsule Take 250 mg by mouth daily as needed.    Marland Kitchen escitalopram (LEXAPRO) 20 MG tablet Take 20 mg by mouth daily.    . ferrous sulfate 325 (65 FE) MG tablet Take 325 mg by mouth daily with breakfast.     No current facility-administered medications on file prior to visit.     Allergies  Allergen Reactions  . Ibuprofen Rash  . Buprenorphine Hcl Itching  . Levofloxacin     Cannot sleep  . Morphine And Related Itching  . Tramadol     Extreme sleeplessness/vomiting- can tolerate small dose per pt  . Adhesive [Tape] Rash  . Aleve [Naproxen Sodium] Rash  . Latex Rash  . Phenylephrine-Dm Anxiety    Family History  Problem Relation Age of Onset   . Diabetes Other   . Hyperlipidemia Other   . Stroke Other   . Ulcerative colitis Other   . Asthma Cousin     BP 104/64 (BP Location: Left Arm, Patient Position: Sitting, Cuff Size: Normal)   Pulse 88   Ht 5' 3.5" (1.613 m)   Wt 127 lb (57.6 kg)   SpO2 97%   BMI 22.14 kg/m   Review of Systems Denies neck pain    Objective:   Physical Exam VITAL SIGNS:  See vs page.   GENERAL: no distress.  NECK: 3 cm left thyroid nodule is again noted.  No LN's palpable.     Lab Results  Component Value Date   TSH 1.02 06/13/2018      Assessment & Plan:  Thyr nodule, due for recheck.   H/o brain tumor. Not related, so this does not affect the f/l interval for thyr nod  Patient Instructions  Let's recheck the ultrasound.  you will receive a phone call, about a day and time for an appointment. Blood tests are requested for you today.  We'll let you know about the results.  Please come back for a follow-up appointment in 1 year.

## 2019-02-13 NOTE — Patient Instructions (Addendum)
Let's recheck the ultrasound.  you will receive a phone call, about a day and time for an appointment. Blood tests are requested for you today.  We'll let you know about the results.   Please come back for a follow-up appointment in 1 year.   

## 2019-02-14 LAB — T4, FREE: Free T4: 0.75 ng/dL (ref 0.60–1.60)

## 2019-02-14 LAB — TSH: TSH: 0.81 u[IU]/mL (ref 0.35–4.50)

## 2019-03-13 ENCOUNTER — Ambulatory Visit
Admission: RE | Admit: 2019-03-13 | Discharge: 2019-03-13 | Disposition: A | Discharge status: Home / Self Care | Payer: BC Managed Care – PPO | Source: Ambulatory Visit | Attending: Endocrinology | Admitting: Endocrinology

## 2019-03-13 DIAGNOSIS — E041 Nontoxic single thyroid nodule: Secondary | ICD-10-CM

## 2019-06-15 ENCOUNTER — Encounter: Payer: Self-pay | Admitting: Internal Medicine

## 2019-06-15 ENCOUNTER — Ambulatory Visit (INDEPENDENT_AMBULATORY_CARE_PROVIDER_SITE_OTHER): Payer: BC Managed Care – PPO | Admitting: Internal Medicine

## 2019-06-15 ENCOUNTER — Other Ambulatory Visit (INDEPENDENT_AMBULATORY_CARE_PROVIDER_SITE_OTHER): Payer: BC Managed Care – PPO

## 2019-06-15 ENCOUNTER — Other Ambulatory Visit: Payer: Self-pay

## 2019-06-15 VITALS — BP 108/70 | HR 67 | Temp 98.4°F | Ht 63.5 in | Wt 126.0 lb

## 2019-06-15 DIAGNOSIS — J45991 Cough variant asthma: Secondary | ICD-10-CM | POA: Diagnosis not present

## 2019-06-15 DIAGNOSIS — Z0000 Encounter for general adult medical examination without abnormal findings: Secondary | ICD-10-CM | POA: Diagnosis not present

## 2019-06-15 DIAGNOSIS — E041 Nontoxic single thyroid nodule: Secondary | ICD-10-CM

## 2019-06-15 DIAGNOSIS — D500 Iron deficiency anemia secondary to blood loss (chronic): Secondary | ICD-10-CM

## 2019-06-15 LAB — LIPID PANEL
Cholesterol: 247 mg/dL — ABNORMAL HIGH (ref 0–200)
HDL: 53.9 mg/dL (ref >39.00)
LDL Cholesterol: 171 mg/dL — ABNORMAL HIGH (ref 0–99)
NonHDL: 193.15
Total CHOL/HDL Ratio: 5
Triglycerides: 112 mg/dL (ref 0.0–149.0)
VLDL: 22.4 mg/dL (ref 0.0–40.0)

## 2019-06-15 LAB — CBC
HCT: 39.4 % (ref 36.0–46.0)
Hemoglobin: 13 g/dL (ref 12.0–15.0)
MCHC: 33 g/dL (ref 30.0–36.0)
MCV: 83.9 fl (ref 78.0–100.0)
Platelets: 310 10*3/uL (ref 150.0–400.0)
RBC: 4.69 Mil/uL (ref 3.87–5.11)
RDW: 14.4 % (ref 11.5–15.5)
WBC: 7.4 10*3/uL (ref 4.0–10.5)

## 2019-06-15 LAB — COMPREHENSIVE METABOLIC PANEL
ALT: 9 U/L (ref 0–35)
AST: 12 U/L (ref 0–37)
Albumin: 4.5 g/dL (ref 3.5–5.2)
Alkaline Phosphatase: 57 U/L (ref 39–117)
BUN: 12 mg/dL (ref 6–23)
CO2: 27 mEq/L (ref 19–32)
Calcium: 9.1 mg/dL (ref 8.4–10.5)
Chloride: 104 mEq/L (ref 96–112)
Creatinine, Ser: 0.62 mg/dL (ref 0.40–1.20)
GFR: 101.26 mL/min (ref >60.00)
Glucose, Bld: 90 mg/dL (ref 70–99)
Potassium: 3.7 mEq/L (ref 3.5–5.1)
Sodium: 139 mEq/L (ref 135–145)
Total Bilirubin: 0.5 mg/dL (ref 0.2–1.2)
Total Protein: 7 g/dL (ref 6.0–8.3)

## 2019-06-15 LAB — VITAMIN D 25 HYDROXY (VIT D DEFICIENCY, FRACTURES): VITD: 57.11 ng/mL (ref 30.00–100.00)

## 2019-06-15 NOTE — Assessment & Plan Note (Signed)
Takes iron every other day. Still having menstrual cycles.

## 2019-06-15 NOTE — Assessment & Plan Note (Signed)
Flu shot counseled. Shingrix first done, second due declines today will get at pharmacy. Tetanus up to date. Cologuard done through gyn. Mammogram up to date, pap smear up to date. Counseled about sun safety and mole surveillance. Counseled about the dangers of distracted driving. Given 10 year screening recommendations.

## 2019-06-15 NOTE — Progress Notes (Signed)
   Subjective:   Patient ID: Deborah Ortega, female    DOB: 02/12/1966, 53 y.o.   MRN: [id number]  HPI The patient is a 53 YO female coming in for physical. Still having ankle problems.   PMH, [facility], social history reviewed and updated  Review of Systems  Constitutional: Negative.   HENT: Negative.   Eyes: Negative.   Respiratory: Negative for cough, chest tightness and shortness of breath.   Cardiovascular: Negative for chest pain, palpitations and leg swelling.  Gastrointestinal: Negative for abdominal distention, abdominal pain, constipation, diarrhea, nausea and vomiting.  Musculoskeletal: Positive for myalgias.  Skin: Negative.   Neurological: Negative.   Psychiatric/Behavioral: Negative.     Objective:  Physical Exam Constitutional:      Appearance: She is well-developed.  HENT:     Head: Normocephalic and atraumatic.  Neck:     Musculoskeletal: Normal range of motion.  Cardiovascular:     Rate and Rhythm: Normal rate and regular rhythm.  Pulmonary:     Effort: Pulmonary effort is normal. No respiratory distress.     Breath sounds: Normal breath sounds. No wheezing or rales.  Abdominal:     General: Bowel sounds are normal. There is no distension.     Palpations: Abdomen is soft.     Tenderness: There is no abdominal tenderness. There is no rebound.  Skin:    General: Skin is warm and dry.  Neurological:     Mental Status: She is alert and oriented to person, place, and time.     Coordination: Coordination normal.     Vitals:   06/15/19 1541  BP: 108/70  Pulse: 67  Temp: 98.4 F (36.9 C)  TempSrc: Oral  SpO2: 97%  Weight: 126 lb (57.2 kg)  Height: 5' 3.5" (1.613 m)    Assessment & Plan:

## 2019-06-15 NOTE — Assessment & Plan Note (Signed)
Recent TSH and free T4 normal

## 2019-06-15 NOTE — Patient Instructions (Addendum)
Jan 6th is the last day to get the shingles shot. It is okay to get the same day as flu shot.  Health Maintenance, Female Adopting a healthy lifestyle and getting preventive care are important in promoting health and wellness. Ask your health care provider about:  The right schedule for you to have regular tests and exams.  Things you can do on your own to prevent diseases and keep yourself healthy. What should I know about diet, weight, and exercise? Eat a healthy diet   Eat a diet that includes plenty of vegetables, fruits, low-fat dairy products, and lean protein.  Do not eat a lot of foods that are high in solid fats, added sugars, or sodium. Maintain a healthy weight Body mass index (BMI) is used to identify weight problems. It estimates body fat based on height and weight. Your health care provider can help determine your BMI and help you achieve or maintain a healthy weight. Get regular exercise Get regular exercise. This is one of the most important things you can do for your health. Most adults should:  Exercise for at least 150 minutes each week. The exercise should increase your heart rate and make you sweat (moderate-intensity exercise).  Do strengthening exercises at least twice a week. This is in addition to the moderate-intensity exercise.  Spend less time sitting. Even light physical activity can be beneficial. Watch cholesterol and blood lipids Have your blood tested for lipids and cholesterol at 51 years of age, then have this test every 5 years. Have your cholesterol levels checked more often if:  Your lipid or cholesterol levels are high.  You are older than 51 years of age.  You are at high risk for heart disease. What should I know about cancer screening? Depending on your health history and family history, you may need to have cancer screening at various ages. This may include screening for:  Breast cancer.  Cervical cancer.  Colorectal cancer.  Skin  cancer.  Lung cancer. What should I know about heart disease, diabetes, and high blood pressure? Blood pressure and heart disease  High blood pressure causes heart disease and increases the risk of stroke. This is more likely to develop in people who have high blood pressure readings, are of African descent, or are overweight.  Have your blood pressure checked: ? Every 3-5 years if you are 24-22 years of age. ? Every year if you are 5 years old or older. Diabetes Have regular diabetes screenings. This checks your fasting blood sugar level. Have the screening done:  Once every three years after age 90 if you are at a normal weight and have a low risk for diabetes.  More often and at a younger age if you are overweight or have a high risk for diabetes. What should I know about preventing infection? Hepatitis B If you have a higher risk for hepatitis B, you should be screened for this virus. Talk with your health care provider to find out if you are at risk for hepatitis B infection. Hepatitis C Testing is recommended for:  Everyone born from [birth year] through 1963.  Anyone with known risk factors for hepatitis C. Sexually transmitted infections (STIs)  Get screened for STIs, including gonorrhea and chlamydia, if: ? You are sexually active and are younger than 51 years of age. ? You are older than 51 years of age and your health care provider tells you that you are at risk for this type of infection. ? Your sexual activity  has changed since you were last screened, and you are at increased risk for chlamydia or gonorrhea. Ask your health care provider if you are at risk.  Ask your health care provider about whether you are at high risk for HIV. Your health care provider may recommend a prescription medicine to help prevent HIV infection. If you choose to take medicine to prevent HIV, you should first get tested for HIV. You should then be tested every 3 months for as long as you are taking  the medicine. Pregnancy  If you are about to stop having your period (premenopausal) and you may become pregnant, seek counseling before you get pregnant.  Take 400 to 800 micrograms (mcg) of folic acid every day if you become pregnant.  Ask for birth control (contraception) if you want to prevent pregnancy. Osteoporosis and menopause Osteoporosis is a disease in which the bones lose minerals and strength with aging. This can result in bone fractures. If you are 53 years old or older, or if you are at risk for osteoporosis and fractures, ask your health care provider if you should:  Be screened for bone loss.  Take a calcium or vitamin D supplement to lower your risk of fractures.  Be given hormone replacement therapy (HRT) to treat symptoms of menopause. Follow these instructions at home: Lifestyle  Do not use any products that contain nicotine or tobacco, such as cigarettes, e-cigarettes, and chewing tobacco. If you need help quitting, ask your health care provider.  Do not use street drugs.  Do not share needles.  Ask your health care provider for help if you need support or information about quitting drugs. Alcohol use  Do not drink alcohol if: ? Your health care provider tells you not to drink. ? You are pregnant, may be pregnant, or are planning to become pregnant.  If you drink alcohol: ? Limit how much you use to 0-1 drink a day. ? Limit intake if you are breastfeeding.  Be aware of how much alcohol is in your drink. In the [place] one drink equals one 12 oz bottle of beer (355 mL), one 5 oz glass of wine (148 mL), or one 1 oz glass of hard liquor (44 mL). General instructions  Schedule regular health, dental, and eye exams.  Stay current with your vaccines.  Tell your health care provider if: ? You often feel depressed. ? You have ever been abused or do not feel safe at home. Summary  Adopting a healthy lifestyle and getting preventive care are important in  promoting health and wellness.  Follow your health care provider's instructions about healthy diet, exercising, and getting tested or screened for diseases.  Follow your health care provider's instructions on monitoring your cholesterol and blood pressure. This information is not intended to replace advice given to you by your health care provider. Make sure you discuss any questions you have with your health care provider. Document Released: 02/15/2011 Document Revised: 07/26/2018 Document Reviewed: 07/26/2018 Elsevier Patient Education  2020 [employer].

## 2019-06-15 NOTE — Assessment & Plan Note (Signed)
Cough is still present and bothersome.

## 2019-11-06 ENCOUNTER — Other Ambulatory Visit: Payer: Self-pay | Admitting: Obstetrics and Gynecology

## 2019-11-06 DIAGNOSIS — Z1231 Encounter for screening mammogram for malignant neoplasm of breast: Secondary | ICD-10-CM

## 2019-12-25 ENCOUNTER — Telehealth: Payer: Self-pay | Admitting: Internal Medicine

## 2019-12-25 NOTE — Telephone Encounter (Signed)
New message  Patient works for [employer], she is requesting a letter saying she can drink water during assessment testing if needed. The school has a policy stating no drinking during testing. Please advise

## 2019-12-26 ENCOUNTER — Encounter: Payer: Self-pay | Admitting: Internal Medicine

## 2019-12-26 NOTE — Telephone Encounter (Signed)
Please advise 

## 2019-12-26 NOTE — Progress Notes (Signed)
Called patient voicemail was full. Unable to leave message

## 2019-12-26 NOTE — Telephone Encounter (Signed)
Fine to do letter 

## 2019-12-27 NOTE — Telephone Encounter (Signed)
Copy of letter at front desk

## 2019-12-27 NOTE — Telephone Encounter (Addendum)
    Patient returning call for letter, will  access letter via My Chart

## 2020-01-10 ENCOUNTER — Other Ambulatory Visit: Payer: Self-pay

## 2020-01-10 ENCOUNTER — Ambulatory Visit
Admission: RE | Admit: 2020-01-10 | Discharge: 2020-01-10 | Disposition: A | Discharge status: Home / Self Care | Payer: BC Managed Care – PPO | Source: Ambulatory Visit | Attending: Obstetrics and Gynecology | Admitting: Obstetrics and Gynecology

## 2020-01-10 ENCOUNTER — Ambulatory Visit: Payer: BC Managed Care – PPO

## 2020-01-10 DIAGNOSIS — Z1231 Encounter for screening mammogram for malignant neoplasm of breast: Secondary | ICD-10-CM

## 2020-02-13 ENCOUNTER — Ambulatory Visit: Payer: BC Managed Care – PPO | Admitting: Endocrinology

## 2020-02-21 ENCOUNTER — Ambulatory Visit: Payer: BC Managed Care – PPO | Admitting: Endocrinology

## 2020-02-21 ENCOUNTER — Other Ambulatory Visit: Payer: Self-pay

## 2020-02-21 ENCOUNTER — Encounter: Payer: Self-pay | Admitting: Endocrinology

## 2020-02-21 VITALS — BP 108/60 | HR 79 | Ht 63.5 in | Wt 131.0 lb

## 2020-02-21 DIAGNOSIS — E041 Nontoxic single thyroid nodule: Secondary | ICD-10-CM

## 2020-02-21 NOTE — Progress Notes (Signed)
Subjective:    Patient ID: Deborah Ortega, female    DOB: 06-Nov-1965, 54 y.o.   MRN: [id number]  HPI Pt returns for f/u of left thyroid nodule (dx'ed 2003, when she had benign bx; f/u US in 2019 showed mild enlargement of the solitary left thyroid nodule since 2016; TSH is low-normal off any rx).  She does not notice the nodule.   Past Medical History:  Diagnosis Date  . ALLERGIC RHINITIS   . Anemia   . Anxiety   . Asthma    being worked up for asthma now. Chronic cough  . Depression    takes lexapro  . GERD   . History of kidney stones   . HOH (hard of hearing)    both ears  . 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 MIGRAINE HEADACHE   . PONV (postoperative nausea and vomiting)   . RENAL CALCULUS, HX OF 06/25/2007, 02/2013  . THYROID NODULE     Past Surgical History:  Procedure Laterality Date  . ANKLE SURGERY Right 04/13/2018  . BREAST BIOPSY Right 10/29/2013  . BREAST LUMPECTOMY WITH RADIOACTIVE SEED LOCALIZATION Right 11/19/2013   Procedure: BREAST LUMPECTOMY WITH RADIOACTIVE SEED LOCALIZATION;  Surgeon: Adin Hector, MD;  Location: [facility];  Service: General;  Laterality: Right;  . CESAREAN SECTION     x's 2  . Meninogioma Resection  07/25/09   pariatal area head-crainiotomy-plate  . WISDOM TOOTH EXTRACTION      Social History   Socioeconomic History  . Marital status: Married    Spouse name: Not on file  . Number of children: Not on file  . Years of education: Not on file  . Highest education level: Not on file  Occupational History  . Not on file  Tobacco Use  . Smoking status: Never Smoker  . Smokeless tobacco: Never Used  . Tobacco comment: Married, lives with spouse and 2 kids. Pt is a Pharmacist, hospital  Substance and Sexual Activity  . Alcohol use: No  . Drug use: No  . Sexual activity: Not on file  Other Topics Concern  . Not on file  Social History Narrative  . Not on file   Social Determinants of Health   Financial Resource  Strain:   . Difficulty of Paying Living Expenses:   Food Insecurity:   . Worried About Charity fundraiser in the Last Year:   . Arboriculturist in the Last Year:   Transportation Needs:   . Film/video editor (Medical):   Marland Kitchen Lack of Transportation (Non-Medical):   Physical Activity:   . Days of Exercise per Week:   . Minutes of Exercise per Session:   Stress:   . Feeling of Stress :   Social Connections:   . Frequency of Communication with Friends and Family:   . Frequency of Social Gatherings with Friends and Family:   . Attends Religious Services:   . Active Member of Clubs or Organizations:   . Attends Archivist Meetings:   Marland Kitchen Marital Status:   Intimate Partner Violence:   . Fear of Current or Ex-Partner:   . Emotionally Abused:   Marland Kitchen Physically Abused:   . Sexually Abused:     Current Outpatient Medications on File Prior to Visit  Medication Sig Dispense Refill  . acetaminophen (TYLENOL) 500 MG tablet Take 1 tablet (500 mg total) by mouth every 6 (six) hours as needed. 30 tablet 0  . cetirizine (ZYRTEC)  10 MG tablet Take 10 mg by mouth at bedtime.     . cholecalciferol (VITAMIN D3) 25 MCG (1000 UNIT) tablet Take 1,000 Units by mouth daily.    Marland Kitchen docusate sodium (COLACE) 250 MG capsule Take 250 mg by mouth daily as needed.    Marland Kitchen escitalopram (LEXAPRO) 20 MG tablet Take 20 mg by mouth daily.    . ferrous sulfate 325 (65 FE) MG tablet Take 325 mg by mouth See admin instructions. Take 1 tablet by mouth with breakfast one to two times per week.     No current facility-administered medications on file prior to visit.    Allergies  Allergen Reactions  . Ibuprofen Rash  . Buprenorphine Hcl Itching  . Levofloxacin     Cannot sleep  . Morphine And Related Itching  . Tramadol     Extreme sleeplessness/vomiting- can tolerate small dose per pt  . Adhesive [Tape] Rash  . Aleve [Naproxen Sodium] Rash  . Latex Rash  . Phenylephrine-Dm Anxiety    Family History    Problem Relation Age of Onset  . Diabetes Other   . Hyperlipidemia Other   . Stroke Other   . Ulcerative colitis Other   . Asthma Cousin     BP 108/60 (BP Location: Left Arm, Patient Position: Sitting, Cuff Size: Normal)   Pulse 79   Ht 5' 3.5" (1.613 m)   Wt 131 lb (59.4 kg)   SpO2 98%   BMI 22.84 kg/m    Review of Systems Denies neck pain and hoarseness.      Objective:   Physical Exam VITAL SIGNS:  See vs page GENERAL: no distress NECK: 2-3 cm left thyroid nodule is again noted.  No palpable lymphadenopathy.    Lab Results  Component Value Date   TSH 0.81 02/13/2019       Assessment & Plan:  Thyroid nodule, clinically stable.   Patient Instructions  Please do your thyroid blood test when you have your annual checkup with Dr Sharlet Salina.  You can skip the ultrasound this year.   Please come back for a follow-up appointment in 1 year.

## 2020-02-21 NOTE — Patient Instructions (Addendum)
Please do your thyroid blood test when you have your annual checkup with Dr Sharlet Salina.  You can skip the ultrasound this year.   Please come back for a follow-up appointment in 1 year.

## 2020-07-18 ENCOUNTER — Telehealth (INDEPENDENT_AMBULATORY_CARE_PROVIDER_SITE_OTHER): Payer: BC Managed Care – PPO | Admitting: Internal Medicine

## 2020-07-18 ENCOUNTER — Other Ambulatory Visit: Payer: BC Managed Care – PPO

## 2020-07-18 ENCOUNTER — Encounter: Payer: Self-pay | Admitting: Internal Medicine

## 2020-07-18 DIAGNOSIS — Z20822 Contact with and (suspected) exposure to covid-19: Secondary | ICD-10-CM

## 2020-07-18 MED ORDER — ALBUTEROL SULFATE HFA 108 (90 BASE) MCG/ACT IN AERS
2.0000 | INHALATION_SPRAY | Freq: Four times a day (QID) | RESPIRATORY_TRACT | 0 refills | Status: DC | PRN
Start: 2020-07-18 — End: 2020-08-29

## 2020-07-18 MED ORDER — PREDNISONE 20 MG PO TABS: 40.0000 mg | ORAL_TABLET | Freq: Every day | ORAL | 0 refills | Status: DC

## 2020-07-18 NOTE — Progress Notes (Signed)
Virtual Visit via Video Note  I connected with Deborah Ortega on 07/18/20 at  1:20 PM EST by a video enabled telemedicine application and verified that I am speaking with the correct person using two identifiers.  The patient and the provider were at separate locations throughout the entire encounter. Patient location: home, Provider location: work   I discussed the limitations of evaluation and management by telemedicine and the availability of in person appointments. The patient expressed understanding and agreed to proceed. The patient and the provider were the only parties present for the visit unless noted in HPI below.  History of Present Illness: The patient is a 54 y.o. female with visit for possible covid-19. Her husband tested positive for covid-19 on Wednesday. Started having symptoms Wednesday night with mild symptoms. Test for covid-19 pending for patient. She is having fever to 102 F, some nausea and vomiting. Has muscle aches as well. Denies SOB. Overall it is not improving. Has tried tylenol for fever. She is not vaccinated against covid-19 and has prior history of cough variant asthma.  Observations/Objective: Appearance: normal, breathing appears normal, some coughing during visit, appears sick, casual grooming, abdomen does not appear distended, throat not well visualized, memory normal, mental status is A and O times 3  Assessment and Plan: See problem oriented charting  Follow Up Instructions: await covid-19 results. Rx prednisone and albuterol inhaler, continue quarantine and supportive measures. If covid-19 positive would be likely candidate for monoclonal antibody and agrees to this.   Visit time 25 minutes in face to face communication with patient and coordination of care, additional 5 minutes spent in record review, coordination or care, ordering tests, communicating/referring to other healthcare professionals, documenting in medical records all on the same day of the  visit for total time 30 minutes spent on the visit.    I discussed the assessment and treatment plan with the patient. The patient was provided an opportunity to ask questions and all were answered. The patient agreed with the plan and demonstrated an understanding of the instructions.   The patient was advised to call back or seek an in-person evaluation if the symptoms worsen or if the condition fails to improve as anticipated.  Hoyt Koch, MD

## 2020-07-18 NOTE — Assessment & Plan Note (Signed)
Awaiting test results. Given supportive measures instructions. Rx prednisone and albuterol inhaler. Likely would be candidate for monoclonal antibody if positive testing and advised her on same. Advised if she gets monoclonal antibody recommendation to wait 90 days for vaccine if she decides to take this.

## 2020-07-20 LAB — NOVEL CORONAVIRUS, NAA: SARS-CoV-2, NAA: DETECTED — AB

## 2020-07-20 LAB — SARS-COV-2, NAA 2 DAY TAT

## 2020-07-21 ENCOUNTER — Telehealth (HOSPITAL_COMMUNITY): Payer: Self-pay | Admitting: *Deleted

## 2020-07-21 NOTE — Telephone Encounter (Signed)
Called to Discuss with patient about Covid symptoms and the use of the monoclonal antibody infusion for those with mild to moderate Covid symptoms and at a high risk of hospitalization.     Pt is qualified for this infusion due to co-morbid conditions and/or a member of an at-risk group.     Patient Active Problem List   Diagnosis Date Noted  . Suspected COVID-19 virus infection 07/18/2020  . Anemia 03/31/2017  . History of brain tumor 03/31/2017  . Routine general medical examination at a health care facility 12/15/2014  . Cough variant asthma 07/08/2014  . 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 Upper airway cough syndrome 02/06/2013  . THYROID NODULE 07/16/2009  . ALLERGIC RHINITIS 07/15/2009  . GERD 07/15/2009    Patient declines infusion at this time. Symptoms tier reviewed as well as criteria for ending isolation. Preventative practices reviewed. Patient verbalized understanding.    Patient advised to call back if he/she decides that he/she does want to get infusion. Callback number to the infusion center given. Patient advised to go to Urgent care or ED with severe symptoms.

## 2020-07-24 ENCOUNTER — Other Ambulatory Visit: Payer: Self-pay | Admitting: Family

## 2020-07-24 DIAGNOSIS — U071 COVID-19: Secondary | ICD-10-CM

## 2020-07-24 NOTE — Telephone Encounter (Signed)
Patient states she is trying to call the monoclonal antibody infusion center but is not able to get through.  Although she declined, she would like to get the infusion now.  Please contact the patient

## 2020-07-24 NOTE — Progress Notes (Signed)
I connected by phone with Deborah Ortega on 07/24/2020 at 6:40 PM to discuss the potential use of a new treatment for mild to moderate COVID-19 viral infection in non-hospitalized patients.  This patient is a 54 y.o. female that meets the FDA criteria for Emergency Use Authorization of COVID monoclonal antibody casirivimab/imdevimab, bamlanivimab/eteseviamb, or sotrovimab.  Has a (+) direct SARS-CoV-2 viral test result  Has mild or moderate COVID-19   Is NOT hospitalized due to COVID-19  Is within 10 days of symptom onset  Has at least one of the high risk factor(s) for progression to severe COVID-19 and/or hospitalization as defined in EUA.  Specific high risk criteria : Chronic Lung Disease   Symptoms of fever, nausea, H/A, aches began 07/16/20.   I have spoken and communicated the following to the patient or parent/caregiver regarding COVID monoclonal antibody treatment:  1. FDA has authorized the emergency use for the treatment of mild to moderate COVID-19 in adults and pediatric patients with positive results of direct SARS-CoV-2 viral testing who are 71 years of age and older weighing at least 40 kg, and who are at high risk for progressing to severe COVID-19 and/or hospitalization.  2. The significant known and potential risks and benefits of COVID monoclonal antibody, and the extent to which such potential risks and benefits are unknown.  3. Information on available alternative treatments and the risks and benefits of those alternatives, including clinical trials.  4. Patients treated with COVID monoclonal antibody should continue to self-isolate and use infection control measures (e.g., wear mask, isolate, social distance, avoid sharing personal items, clean and disinfect "high touch" surfaces, and frequent handwashing) according to CDC guidelines.   5. The patient or parent/caregiver has the option to accept or refuse COVID monoclonal antibody treatment.  After reviewing this  information with the patient, the patient has agreed to receive one of the available covid 19 monoclonal antibodies and will be provided an appropriate fact sheet prior to infusion. [name], NP 07/24/2020 6:40 PM

## 2020-07-25 ENCOUNTER — Ambulatory Visit (HOSPITAL_COMMUNITY)
Admission: RE | Admit: 2020-07-25 | Discharge: 2020-07-25 | Disposition: A | Discharge status: Home / Self Care | Payer: BC Managed Care – PPO | Source: Ambulatory Visit | Attending: Pulmonary Disease | Admitting: Pulmonary Disease

## 2020-07-25 DIAGNOSIS — U071 COVID-19: Secondary | ICD-10-CM | POA: Insufficient documentation

## 2020-07-25 MED ORDER — DIPHENHYDRAMINE HCL 50 MG/ML IJ SOLN: 50.0000 mg | Freq: Once | INTRAMUSCULAR | Status: DC | PRN

## 2020-07-25 MED ORDER — FAMOTIDINE IN NACL 20-0.9 MG/50ML-% IV SOLN: 20.0000 mg | Freq: Once | INTRAVENOUS | Status: DC | PRN

## 2020-07-25 MED ORDER — SODIUM CHLORIDE 0.9 % IV SOLN: INTRAVENOUS | Status: DC | PRN

## 2020-07-25 MED ORDER — SODIUM CHLORIDE 0.9 % IV SOLN
1200.0000 mg | Freq: Once | INTRAVENOUS | Status: AC
  Administered 2020-07-25: 1200 mg via INTRAVENOUS

## 2020-07-25 MED ORDER — EPINEPHRINE 0.3 MG/0.3ML IJ SOAJ: 0.3000 mg | Freq: Once | INTRAMUSCULAR | Status: DC | PRN

## 2020-07-25 MED ORDER — SODIUM CHLORIDE 0.9 % IV SOLN: Freq: Once | INTRAVENOUS | Status: AC

## 2020-07-25 MED ORDER — ALBUTEROL SULFATE HFA 108 (90 BASE) MCG/ACT IN AERS: 2.0000 | INHALATION_SPRAY | Freq: Once | RESPIRATORY_TRACT | Status: DC | PRN

## 2020-07-25 MED ORDER — METHYLPREDNISOLONE SODIUM SUCC 125 MG IJ SOLR: 125.0000 mg | Freq: Once | INTRAMUSCULAR | Status: DC | PRN

## 2020-07-25 NOTE — Progress Notes (Signed)
Patient reviewed Fact Sheet for Patients, Parents, and Caregivers for Emergency Use Authorization (EUA) of Casi/Regen for the Treatment of Coronavirus. Patient also reviewed and is agreeable to the estimated cost of treatment. Patient is agreeable to proceed.    

## 2020-07-25 NOTE — Progress Notes (Signed)
  Diagnosis: COVID-19  Physician: Dr.Wright   Procedure: Covid Infusion Clinic Med: casirivimab\imdevimab infusion - Provided patient with casirivimab\imdevimab fact sheet for patients, parents and caregivers prior to infusion.  Complications: No immediate complications noted.  Discharge: Discharged home   Deborah Ortega 07/25/2020

## 2020-07-25 NOTE — Discharge Instructions (Signed)
10 Things You Can Do to Manage Your COVID-19 Symptoms at Home If you have possible or confirmed COVID-19: 1. Stay home from work and school. And stay away from other public places. If you must go out, avoid using any kind of public transportation, ridesharing, or taxis. 2. Monitor your symptoms carefully. If your symptoms get worse, call your healthcare provider immediately. 3. Get rest and stay hydrated. 4. If you have a medical appointment, call the healthcare provider ahead of time and tell them that you have or may have COVID-19. 5. For medical emergencies, call 911 and notify the dispatch personnel that you have or may have COVID-19. 6. Cover your cough and sneezes with a tissue or use the inside of your elbow. 7. Wash your hands often with soap and water for at least 20 seconds or clean your hands with an alcohol-based hand sanitizer that contains at least 60% alcohol. 8. As much as possible, stay in a specific room and away from other people in your home. Also, you should use a separate bathroom, if available. If you need to be around other people in or outside of the home, wear a mask. 9. Avoid sharing personal items with other people in your household, like dishes, towels, and bedding. 10. Clean all surfaces that are touched often, like counters, tabletops, and doorknobs. Use household cleaning sprays or wipes according to the label instructions. cdc.gov/coronavirus 02/14/2019 This information is not intended to replace advice given to you by your health care provider. Make sure you discuss any questions you have with your health care provider. Document Revised: 07/19/2019 Document Reviewed: 07/19/2019 Elsevier Patient Education  2020 [employer] What types of side effects do monoclonal antibody drugs cause?  Common side effects  In general, the more common side effects caused by monoclonal antibody drugs include: . Allergic reactions, such as hives or itching . Flu-like signs and  symptoms, including chills, fatigue, fever, and muscle aches and pains . Nausea, vomiting . Diarrhea . Skin rashes . Low blood pressure   The CDC is recommending patients who receive monoclonal antibody treatments wait at least 90 days before being vaccinated.  Currently, there are no data on the safety and efficacy of mRNA COVID-19 vaccines in persons who received monoclonal antibodies or convalescent plasma as part of COVID-19 treatment. Based on the estimated half-life of such therapies as well as evidence suggesting that reinfection is uncommon in the 90 days after initial infection, vaccination should be deferred for at least 90 days, as a precautionary measure until additional information becomes available, to avoid interference of the antibody treatment with vaccine-induced immune responses. If you have any questions or concerns after the infusion please call the Advanced Practice Provider on call at [phone]. This number is ONLY intended for your use regarding questions or concerns about the infusion post-treatment side-effects.  Please do not provide this number to others for use. For return to work notes please contact your primary care provider.   If someone you know is interested in receiving treatment please have them call the COVID hotline at [phone].   

## 2020-07-31 ENCOUNTER — Telehealth: Payer: Self-pay | Admitting: Internal Medicine

## 2020-07-31 NOTE — Telephone Encounter (Signed)
   Patient requesting letter to provide her employer( [employer] school), stating the start and end date of quarantine, date of positive COVID test Ok to put letter in [employer]

## 2020-07-31 NOTE — Telephone Encounter (Signed)
Letter on mychart

## 2020-07-31 NOTE — Telephone Encounter (Signed)
Pt informed of below.  

## 2020-08-29 ENCOUNTER — Encounter: Payer: Self-pay | Admitting: Internal Medicine

## 2020-08-29 ENCOUNTER — Ambulatory Visit (INDEPENDENT_AMBULATORY_CARE_PROVIDER_SITE_OTHER): Payer: BC Managed Care – PPO | Admitting: Internal Medicine

## 2020-08-29 ENCOUNTER — Other Ambulatory Visit: Payer: Self-pay

## 2020-08-29 VITALS — BP 106/74 | HR 71 | Temp 99.2°F | Ht 63.0 in | Wt 128.4 lb

## 2020-08-29 DIAGNOSIS — Z0000 Encounter for general adult medical examination without abnormal findings: Secondary | ICD-10-CM | POA: Diagnosis not present

## 2020-08-29 DIAGNOSIS — E041 Nontoxic single thyroid nodule: Secondary | ICD-10-CM | POA: Diagnosis not present

## 2020-08-29 LAB — LIPID PANEL
Cholesterol: 247 mg/dL — ABNORMAL HIGH (ref 0–200)
HDL: 57.8 mg/dL (ref >39.00)
LDL Cholesterol: 169 mg/dL — ABNORMAL HIGH (ref 0–99)
NonHDL: 189.09
Total CHOL/HDL Ratio: 4
Triglycerides: 102 mg/dL (ref 0.0–149.0)
VLDL: 20.4 mg/dL (ref 0.0–40.0)

## 2020-08-29 LAB — COMPREHENSIVE METABOLIC PANEL
ALT: 8 U/L (ref 0–35)
AST: 12 U/L (ref 0–37)
Albumin: 4.6 g/dL (ref 3.5–5.2)
Alkaline Phosphatase: 63 U/L (ref 39–117)
BUN: 9 mg/dL (ref 6–23)
CO2: 28 mEq/L (ref 19–32)
Calcium: 9.5 mg/dL (ref 8.4–10.5)
Chloride: 103 mEq/L (ref 96–112)
Creatinine, Ser: 0.63 mg/dL (ref 0.40–1.20)
GFR: 101.74 mL/min (ref >60.00)
Glucose, Bld: 81 mg/dL (ref 70–99)
Potassium: 4 mEq/L (ref 3.5–5.1)
Sodium: 139 mEq/L (ref 135–145)
Total Bilirubin: 0.4 mg/dL (ref 0.2–1.2)
Total Protein: 7.3 g/dL (ref 6.0–8.3)

## 2020-08-29 LAB — CBC
HCT: 39.7 % (ref 36.0–46.0)
Hemoglobin: 13.3 g/dL (ref 12.0–15.0)
MCHC: 33.4 g/dL (ref 30.0–36.0)
MCV: 80.9 fl (ref 78.0–100.0)
Platelets: 344 10*3/uL (ref 150.0–400.0)
RBC: 4.91 Mil/uL (ref 3.87–5.11)
RDW: 17.1 % — ABNORMAL HIGH (ref 11.5–15.5)
WBC: 7.8 10*3/uL (ref 4.0–10.5)

## 2020-08-29 LAB — TSH: TSH: 1.25 u[IU]/mL (ref 0.35–4.50)

## 2020-08-29 LAB — T4, FREE: Free T4: 0.78 ng/dL (ref 0.60–1.60)

## 2020-08-29 NOTE — Progress Notes (Signed)
   Subjective:   Patient ID: Deborah Ortega, female    DOB: 1965-11-03, 55 y.o.   MRN: [id number]  HPI The patient is a 55 YO female coming in for physical.  PMH, Alexander, social history reviewed and updated  Review of Systems  Constitutional: Negative.   HENT: Negative.   Eyes: Negative.   Respiratory: Negative for cough, chest tightness and shortness of breath.   Cardiovascular: Negative for chest pain, palpitations and leg swelling.  Gastrointestinal: Negative for abdominal distention, abdominal pain, constipation, diarrhea, nausea and vomiting.  Musculoskeletal: Negative.   Skin: Negative.   Neurological: Negative.   Psychiatric/Behavioral: Negative.     Objective:  Physical Exam Constitutional:      Appearance: She is well-developed and well-nourished.  HENT:     Head: Normocephalic and atraumatic.  Eyes:     Extraocular Movements: EOM normal.  Cardiovascular:     Rate and Rhythm: Normal rate and regular rhythm.  Pulmonary:     Effort: Pulmonary effort is normal. No respiratory distress.     Breath sounds: Normal breath sounds. No wheezing or rales.  Abdominal:     General: Bowel sounds are normal. There is no distension.     Palpations: Abdomen is soft.     Tenderness: There is no abdominal tenderness. There is no rebound.  Musculoskeletal:        General: No edema.     Cervical back: Normal range of motion.  Skin:    General: Skin is warm and dry.  Neurological:     Mental Status: She is alert and oriented to person, place, and time.     Coordination: Coordination normal.  Psychiatric:        Mood and Affect: Mood and affect normal.     Vitals:   08/29/20 1516  BP: 106/74  Pulse: 71  Temp: 99.2 F (37.3 C)  TempSrc: Oral  SpO2: 100%  Weight: 128 lb 6.4 oz (58.2 kg)  Height: 5\' 3"  (1.6 m)   EKG: Rate 66, axis normal, intervals intervals normal, sinus, no st or t wave changes, no significant change since 2017  This visit occurred during the SARS-CoV-2  public health emergency.  Safety protocols were in place, including screening questions prior to the visit, additional usage of staff PPE, and extensive cleaning of exam room while observing appropriate contact time as indicated for disinfecting solutions.   Assessment & Plan:

## 2020-08-29 NOTE — Patient Instructions (Signed)
The EKG is normal.   Starting March 10th you can get the covid-19 vaccine if you want and I would strongly recommend this.   Health Maintenance, Female Adopting a healthy lifestyle and getting preventive care are important in promoting health and wellness. Ask your health care provider about:  The right schedule for you to have regular tests and exams.  Things you can do on your own to prevent diseases and keep yourself healthy. What should I know about diet, weight, and exercise? Eat a healthy diet  Eat a diet that includes plenty of vegetables, fruits, low-fat dairy products, and lean protein.  Do not eat a lot of foods that are high in solid fats, added sugars, or sodium.   Maintain a healthy weight Body mass index (BMI) is used to identify weight problems. It estimates body fat based on height and weight. Your health care provider can help determine your BMI and help you achieve or maintain a healthy weight. Get regular exercise Get regular exercise. This is one of the most important things you can do for your health. Most adults should:  Exercise for at least 150 minutes each week. The exercise should increase your heart rate and make you sweat (moderate-intensity exercise).  Do strengthening exercises at least twice a week. This is in addition to the moderate-intensity exercise.  Spend less time sitting. Even light physical activity can be beneficial. Watch cholesterol and blood lipids Have your blood tested for lipids and cholesterol at 54 years of age, then have this test every 5 years. Have your cholesterol levels checked more often if:  Your lipid or cholesterol levels are high.  You are older than 53 years of age.  You are at high risk for heart disease. What should I know about cancer screening? Depending on your health history and family history, you may need to have cancer screening at various ages. This may include screening for:  Breast cancer.  Cervical  cancer.  Colorectal cancer.  Skin cancer.  Lung cancer. What should I know about heart disease, diabetes, and high blood pressure? Blood pressure and heart disease  High blood pressure causes heart disease and increases the risk of stroke. This is more likely to develop in people who have high blood pressure readings, are of African descent, or are overweight.  Have your blood pressure checked: ? Every 3-5 years if you are 58-26 years of age. ? Every year if you are 23 years old or older. Diabetes Have regular diabetes screenings. This checks your fasting blood sugar level. Have the screening done:  Once every three years after age 72 if you are at a normal weight and have a low risk for diabetes.  More often and at a younger age if you are overweight or have a high risk for diabetes. What should I know about preventing infection? Hepatitis B If you have a higher risk for hepatitis B, you should be screened for this virus. Talk with your health care provider to find out if you are at risk for hepatitis B infection. Hepatitis C Testing is recommended for:  Everyone born from [birth year] through 1963.  Anyone with known risk factors for hepatitis C. Sexually transmitted infections (STIs)  Get screened for STIs, including gonorrhea and chlamydia, if: ? You are sexually active and are younger than 53 years of age. ? You are older than 53 years of age and your health care provider tells you that you are at risk for this type of infection. ?  Your sexual activity has changed since you were last screened, and you are at increased risk for chlamydia or gonorrhea. Ask your health care provider if you are at risk.  Ask your health care provider about whether you are at high risk for HIV. Your health care provider may recommend a prescription medicine to help prevent HIV infection. If you choose to take medicine to prevent HIV, you should first get tested for HIV. You should then be tested every 3  months for as long as you are taking the medicine. Pregnancy  If you are about to stop having your period (premenopausal) and you may become pregnant, seek counseling before you get pregnant.  Take 400 to 800 micrograms (mcg) of folic acid every day if you become pregnant.  Ask for birth control (contraception) if you want to prevent pregnancy. Osteoporosis and menopause Osteoporosis is a disease in which the bones lose minerals and strength with aging. This can result in bone fractures. If you are 40 years old or older, or if you are at risk for osteoporosis and fractures, ask your health care provider if you should:  Be screened for bone loss.  Take a calcium or vitamin D supplement to lower your risk of fractures.  Be given hormone replacement therapy (HRT) to treat symptoms of menopause. Follow these instructions at home: Lifestyle  Do not use any products that contain nicotine or tobacco, such as cigarettes, e-cigarettes, and chewing tobacco. If you need help quitting, ask your health care provider.  Do not use street drugs.  Do not share needles.  Ask your health care provider for help if you need support or information about quitting drugs. Alcohol use  Do not drink alcohol if: ? Your health care provider tells you not to drink. ? You are pregnant, may be pregnant, or are planning to become pregnant.  If you drink alcohol: ? Limit how much you use to 0-1 drink a day. ? Limit intake if you are breastfeeding.  Be aware of how much alcohol is in your drink. In the [place] one drink equals one 12 oz bottle of beer (355 mL), one 5 oz glass of wine (148 mL), or one 1 oz glass of hard liquor (44 mL). General instructions  Schedule regular health, dental, and eye exams.  Stay current with your vaccines.  Tell your health care provider if: ? You often feel depressed. ? You have ever been abused or do not feel safe at home. Summary  Adopting a healthy lifestyle and getting  preventive care are important in promoting health and wellness.  Follow your health care provider's instructions about healthy diet, exercising, and getting tested or screened for diseases.  Follow your health care provider's instructions on monitoring your cholesterol and blood pressure. This information is not intended to replace advice given to you by your health care provider. Make sure you discuss any questions you have with your health care provider. Document Revised: 07/26/2018 Document Reviewed: 07/26/2018 Elsevier Patient Education  2021 [employer].

## 2020-08-29 NOTE — Assessment & Plan Note (Signed)
Checking TSH and free T4.

## 2020-08-29 NOTE — Assessment & Plan Note (Signed)
Flu shot declines. Covid-19 not due until October 23, 2020 given recent monoclonal antibody and strongly counseled to get when able. Shingrix declines. Tetanus declines. Colonoscopy declines. Mammogram up to date with gyn, pap smear up to date with gyn. Counseled about sun safety and mole surveillance. Counseled about the dangers of distracted driving. Given 10 year screening recommendations.

## 2020-11-25 ENCOUNTER — Telehealth (INDEPENDENT_AMBULATORY_CARE_PROVIDER_SITE_OTHER): Payer: BC Managed Care – PPO | Admitting: Family

## 2020-11-25 DIAGNOSIS — H9201 Otalgia, right ear: Secondary | ICD-10-CM

## 2020-11-25 NOTE — Progress Notes (Signed)
Deborah Ortega is a 55 y.o. female with the following history as recorded in [employer]:  Patient Active Problem List   Diagnosis Date Noted  . Anemia 03/31/2017  . History of brain tumor 03/31/2017  . Routine general medical examination at a health care facility 12/15/2014  . Cough variant asthma 07/08/2014  . 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 Upper airway cough syndrome 02/06/2013  . THYROID NODULE 07/16/2009  . ALLERGIC RHINITIS 07/15/2009  . GERD 07/15/2009    Current Outpatient Medications  Medication Sig Dispense Refill  . acetaminophen (TYLENOL) 500 MG tablet Take 1 tablet (500 mg total) by mouth every 6 (six) hours as needed. 30 tablet 0  . cetirizine (ZYRTEC) 10 MG tablet Take 10 mg by mouth at bedtime.     . cholecalciferol (VITAMIN D3) 25 MCG (1000 UNIT) tablet Take 1,000 Units by mouth daily.    Marland Kitchen docusate sodium (COLACE) 250 MG capsule Take 250 mg by mouth daily as needed.    Marland Kitchen escitalopram (LEXAPRO) 20 MG tablet Take 20 mg by mouth daily.    . ferrous sulfate 325 (65 FE) MG tablet Take 325 mg by mouth See admin instructions. Take 1 tablet by mouth with breakfast one to two times per week.     No current facility-administered medications for this visit.    Allergies: Ibuprofen, Buprenorphine hcl, Levofloxacin, Morphine and related, Tramadol, Adhesive [tape], Aleve [naproxen sodium], Latex, and Phenylephrine-dm  Past Medical History:  Diagnosis Date  . ALLERGIC RHINITIS   . Anemia   . Anxiety   . Asthma    being worked up for asthma now. Chronic cough  . Depression    takes lexapro  . GERD   . History of kidney stones   . HOH (hard of hearing)    both ears  . 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 MIGRAINE HEADACHE   . PONV (postoperative nausea and vomiting)   . RENAL CALCULUS, HX OF 06/25/2007, 02/2013  . THYROID NODULE     Past Surgical History:  Procedure Laterality Date  . ANKLE SURGERY Right 04/13/2018  . BREAST BIOPSY Right 10/29/2013  .  BREAST LUMPECTOMY WITH RADIOACTIVE SEED LOCALIZATION Right 11/19/2013   Procedure: BREAST LUMPECTOMY WITH RADIOACTIVE SEED LOCALIZATION;  Surgeon: Adin Hector, MD;  Location: [place];  Service: General;  Laterality: Right;  . CESAREAN SECTION     x's 2  . Meninogioma Resection  07/25/09   pariatal area head-crainiotomy-plate  . WISDOM TOOTH EXTRACTION      Family History  Problem Relation Age of Onset  . Diabetes Other   . Hyperlipidemia Other   . Stroke Other   . Ulcerative colitis Other   . Asthma Cousin     Social History   Tobacco Use  . Smoking status: Never Smoker  . Smokeless tobacco: Never Used  . Tobacco comment: Married, lives with spouse and 2 kids. Pt is a Pharmacist, hospital  Substance Use Topics  . Alcohol use: No    Subjective:   I connected with PAISELY BRICK on 11/25/20 at 11:40 AM EDT by a video enabled telemedicine application and verified that I am speaking with the correct person using two identifiers.   I discussed the limitations of evaluation and management by telemedicine and the availability of in person appointments. The patient expressed understanding and agreed to proceed. Provider in office/ patient is at home; provider and patient are only 2 people on video call.   Patient  requested virtual visit due to work schedule restrictions; complaining of sensation of right ear "vibrating" or "bumping" x 1 day; no sinus pain or pressure; no sore throat; does have seasonal allergies- started using Nasonex yesterday; has required to have ear wax removed in the past- saw ENT in August and had vacuum procedure done;     Objective:  There were no vitals filed for this visit.  General: Well developed, well nourished, in no acute distress  Head: Normocephalic and atraumatic  Lungs: Respirations unlabored;  Neurologic: Alert and oriented; speech intact; face symmetrical;   Assessment:  1. Discomfort of right ear     Plan:  Treatment is limited due  to nature of virtual visit; patient will try increasing her Nasonex to bid; she will also try OTC home kit for treatment for ear wax; if symptoms persist, she plans to see her ENT in person for further evaluation;    No follow-ups on file.  No orders of the defined types were placed in this encounter.   Requested Prescriptions    No prescriptions requested or ordered in this encounter

## 2021-02-04 ENCOUNTER — Telehealth: Payer: Self-pay | Admitting: Internal Medicine

## 2021-02-04 NOTE — Telephone Encounter (Signed)
Patient said that she is Covid +, she said that she has sore throat, boadyaches and fever. She said that this is the second time that she has had Covid 19. She is scheduled for virtual tomorrow with Dr. Jenny Reichmann but was wondering what she could do in the mean time to feel better. She can be reached at [phone]. Please advise

## 2021-02-04 NOTE — Telephone Encounter (Signed)
Of course tylenol for pain, delsym for cough are ok, as well as rest, fluids, vit c, vit d, and zinc.   There are no other specific symptoms noted in this note to address.  Ok to see at virtual  thanks

## 2021-02-04 NOTE — Telephone Encounter (Signed)
Please advise 

## 2021-02-05 ENCOUNTER — Telehealth (INDEPENDENT_AMBULATORY_CARE_PROVIDER_SITE_OTHER): Payer: BC Managed Care – PPO | Admitting: Internal Medicine

## 2021-02-05 DIAGNOSIS — J45991 Cough variant asthma: Secondary | ICD-10-CM

## 2021-02-05 DIAGNOSIS — U071 COVID-19: Secondary | ICD-10-CM | POA: Insufficient documentation

## 2021-02-05 DIAGNOSIS — J309 Allergic rhinitis, unspecified: Secondary | ICD-10-CM | POA: Diagnosis not present

## 2021-02-05 MED ORDER — NIRMATRELVIR/RITONAVIR (PAXLOVID)TABLET
3.0000 | ORAL_TABLET | Freq: Two times a day (BID) | ORAL | 0 refills | Status: AC
Start: 2021-02-05 — End: 2021-02-10

## 2021-02-05 NOTE — Progress Notes (Signed)
Patient ID: Deborah Ortega, female   DOB: [birth year], 55 y.o.   MRN: [id number]  Virtual Visit via Video Note  I connected with Deborah Ortega on 02/07/21 at 11:20 AM EDT by a video enabled telemedicine application and verified that I am speaking with the correct person using two identifiers.  Location of all participants today Patient: at home Provider: at office   I discussed the limitations of evaluation and management by telemedicine and the availability of in person appointments. The patient expressed understanding and agreed to proceed.  History of Present Illness: Here after husband was COVID + recently, the pt symptoms began tues am June 6/21 with hoarse, fatigue, fever to 102 at night, HA, overall body aches, ST, scant prod cough, but no sob, n/v, diarrhea, or blood.  Has hx of covid infection prior in dec 2021.  Pt has no vax or booster.  Does also have an unsuual nontender nonitchy plaquelike rash to distal rams and trunk.  Overall feels some improved today after calling yesterday for appt.  Pt denies chest pain, increased sob or doe, wheezing, orthopnea, PND, increased LE swelling, palpitations, dizziness or syncope.   Pt denies polydipsia, polyuria, or focal neuro s/s.    Pt denies wt loss, night sweats, loss of appetite, or other constitutional symptoms  Past Medical History:  Diagnosis Date   ALLERGIC RHINITIS    Anemia    Anxiety    Asthma    being worked up for asthma now. Chronic cough   Depression    takes lexapro   GERD    History of kidney stones    HOH (hard of hearing)    both ears   MENINGIOMA    s/p resction 07/2009   MIGRAINE HEADACHE    PONV (postoperative nausea and vomiting)    RENAL CALCULUS, HX OF 06/25/2007, 02/2013   THYROID NODULE    Past Surgical History:  Procedure Laterality Date   ANKLE SURGERY Right 04/13/2018   BREAST BIOPSY Right 10/29/2013   BREAST LUMPECTOMY WITH RADIOACTIVE SEED LOCALIZATION Right 11/19/2013   Procedure: BREAST LUMPECTOMY  WITH RADIOACTIVE SEED LOCALIZATION;  Surgeon: Adin Hector, MD;  Location: [facility];  Service: General;  Laterality: Right;   CESAREAN SECTION     x's 2   Meninogioma Resection  07/25/09   pariatal area head-crainiotomy-plate   WISDOM TOOTH EXTRACTION      reports that she has never smoked. She has never used smokeless tobacco. She reports that she does not drink alcohol and does not use drugs. family history includes Asthma in her cousin; Diabetes in an other family member; Hyperlipidemia in an other family member; Stroke in an other family member; Ulcerative colitis in an other family member. Allergies  Allergen Reactions   Ibuprofen Rash   Buprenorphine Hcl Itching   Levofloxacin     Cannot sleep   Morphine And Related Itching   Tramadol     Extreme sleeplessness/vomiting- can tolerate small dose per pt   Adhesive [Tape] Rash   Aleve [Naproxen Sodium] Rash   Latex Rash   Phenylephrine-Dm Anxiety   Current Outpatient Medications on File Prior to Visit  Medication Sig Dispense Refill   acetaminophen (TYLENOL) 500 MG tablet Take 1 tablet (500 mg total) by mouth every 6 (six) hours as needed. 30 tablet 0   cetirizine (ZYRTEC) 10 MG tablet Take 10 mg by mouth at bedtime.      cholecalciferol (VITAMIN D3) 25 MCG (1000 UNIT) tablet Take 1,000  Units by mouth daily.     docusate sodium (COLACE) 250 MG capsule Take 250 mg by mouth daily as needed.     escitalopram (LEXAPRO) 20 MG tablet Take 20 mg by mouth daily.     ferrous sulfate 325 (65 FE) MG tablet Take 325 mg by mouth See admin instructions. Take 1 tablet by mouth with breakfast one to two times per week.     No current facility-administered medications on file prior to visit.   Observations/Objective: Alert, NAD, appropriate mood and affect, resps normal, cn 2-12 intact, moves all 4s, no visible rash or swelling Lab Results  Component Value Date   WBC 7.8 08/29/2020   HGB 13.3 08/29/2020   HCT 39.7  08/29/2020   PLT 344.0 08/29/2020   GLUCOSE 81 08/29/2020   CHOL 247 (H) 08/29/2020   TRIG 102.0 08/29/2020   HDL 57.80 08/29/2020   LDLDIRECT 142.6 02/26/2013   LDLCALC 169 (H) 08/29/2020   ALT 8 08/29/2020   AST 12 08/29/2020   NA 139 08/29/2020   K 4.0 08/29/2020   CL 103 08/29/2020   CREATININE 0.63 08/29/2020   BUN 9 08/29/2020   CO2 28 08/29/2020   TSH 1.25 08/29/2020   Assessment and Plan:  See notes Follow Up Instructions: See notes   I discussed the assessment and treatment plan with the patient. The patient was provided an opportunity to ask questions and all were answered. The patient agreed with the plan and demonstrated an understanding of the instructions.   The patient was advised to call back or seek an in-person evaluation if the symptoms worsen or if the condition fails to improve as anticipated.  Cathlean Cower, MD

## 2021-02-05 NOTE — Telephone Encounter (Signed)
See my chart note.

## 2021-02-07 ENCOUNTER — Encounter: Payer: Self-pay | Admitting: Internal Medicine

## 2021-02-07 NOTE — Assessment & Plan Note (Signed)
Lab Results  Component Value Date   CREATININE 0.63 08/29/2020  ok for paxlovid course, vit d/vit c/zinc and denies need for other symptomatic meds such as inhaler, zofran, cough med or anti diarheal

## 2021-02-07 NOTE — Patient Instructions (Signed)
Please take all new medication as prescribed 

## 2021-02-07 NOTE — Assessment & Plan Note (Signed)
Overall stable, denies worsening symptoms or sob/wheezing, cont current tx -  Avoidance environmental allergens

## 2021-02-07 NOTE — Assessment & Plan Note (Signed)
O/w stable, cont zyrtec prn,  to f/u any worsening symptoms or concerns

## 2021-02-17 ENCOUNTER — Other Ambulatory Visit: Payer: Self-pay | Admitting: Obstetrics and Gynecology

## 2021-02-17 DIAGNOSIS — Z1231 Encounter for screening mammogram for malignant neoplasm of breast: Secondary | ICD-10-CM

## 2021-02-19 ENCOUNTER — Ambulatory Visit
Admission: RE | Admit: 2021-02-19 | Discharge: 2021-02-19 | Disposition: A | Discharge status: Home / Self Care | Payer: BC Managed Care – PPO | Source: Ambulatory Visit | Attending: Obstetrics and Gynecology | Admitting: Obstetrics and Gynecology

## 2021-02-19 ENCOUNTER — Other Ambulatory Visit: Payer: Self-pay

## 2021-02-19 DIAGNOSIS — Z1231 Encounter for screening mammogram for malignant neoplasm of breast: Secondary | ICD-10-CM

## 2021-02-24 ENCOUNTER — Ambulatory Visit: Payer: BC Managed Care – PPO | Admitting: Endocrinology

## 2021-02-24 ENCOUNTER — Other Ambulatory Visit: Payer: Self-pay

## 2021-02-24 VITALS — BP 108/68 | HR 70 | Ht 63.5 in | Wt 130.0 lb

## 2021-02-24 DIAGNOSIS — E041 Nontoxic single thyroid nodule: Secondary | ICD-10-CM

## 2021-02-24 NOTE — Progress Notes (Signed)
Subjective:    Patient ID: Deborah Ortega, female    DOB: [birth year], 55 y.o.   MRN: [id number]  HPI Pt returns for f/u of left thyroid nodule (dx'ed 2003, when she had benign bx; f/u US in 2020 showed stable nodule; TSH is normal off any rx).  She does not notice the nodule.   Past Medical History:  Diagnosis Date   ALLERGIC RHINITIS    Anemia    Anxiety    Asthma    being worked up for asthma now. Chronic cough   Depression    takes lexapro   GERD    History of kidney stones    HOH (hard of hearing)    both ears   MENINGIOMA    s/p resction 07/2009   MIGRAINE HEADACHE    PONV (postoperative nausea and vomiting)    RENAL CALCULUS, HX OF 06/25/2007, 02/2013   THYROID NODULE     Past Surgical History:  Procedure Laterality Date   ANKLE SURGERY Right 04/13/2018   BREAST BIOPSY Right 10/29/2013   BREAST LUMPECTOMY WITH RADIOACTIVE SEED LOCALIZATION Right 11/19/2013   Procedure: BREAST LUMPECTOMY WITH RADIOACTIVE SEED LOCALIZATION;  Surgeon: Adin Hector, MD;  Location: [facility];  Service: General;  Laterality: Right;   CESAREAN SECTION     x's 2   Meninogioma Resection  07/25/09   pariatal area head-crainiotomy-plate   WISDOM TOOTH EXTRACTION      Social History   Socioeconomic History   Marital status: Married    Spouse name: Not on file   Number of children: Not on file   Years of education: Not on file   Highest education level: Not on file  Occupational History   Not on file  Tobacco Use   Smoking status: Never   Smokeless tobacco: Never   Tobacco comments:    Married, lives with spouse and 2 kids. Pt is a Pharmacist, hospital  Substance and Sexual Activity   Alcohol use: No   Drug use: No   Sexual activity: Not on file  Other Topics Concern   Not on file  Social History Narrative   Not on file   Social Determinants of Health   Financial Resource Strain: Not on file  Food Insecurity: Not on file  Transportation Needs: Not on file  Physical  Activity: Not on file  Stress: Not on file  Social Connections: Not on file  Intimate Partner Violence: Not on file    Current Outpatient Medications on File Prior to Visit  Medication Sig Dispense Refill   acetaminophen (TYLENOL) 500 MG tablet Take 1 tablet (500 mg total) by mouth every 6 (six) hours as needed. 30 tablet 0   cetirizine (ZYRTEC) 10 MG tablet Take 10 mg by mouth at bedtime.      cholecalciferol (VITAMIN D3) 25 MCG (1000 UNIT) tablet Take 1,000 Units by mouth daily.     docusate sodium (COLACE) 250 MG capsule Take 250 mg by mouth daily as needed.     escitalopram (LEXAPRO) 20 MG tablet Take 20 mg by mouth daily.     ferrous sulfate 325 (65 FE) MG tablet Take 325 mg by mouth See admin instructions. Take 1 tablet by mouth with breakfast one to two times per week.     No current facility-administered medications on file prior to visit.    Allergies  Allergen Reactions   Ibuprofen Rash   Buprenorphine Hcl Itching   Levofloxacin     Cannot sleep  Morphine And Related Itching   Tramadol     Extreme sleeplessness/vomiting- can tolerate small dose per pt   Adhesive [Tape] Rash   Aleve [Naproxen Sodium] Rash   Latex Rash   Phenylephrine-Dm Anxiety    Family History  Problem Relation Age of Onset   Diabetes Other    Hyperlipidemia Other    Stroke Other    Ulcerative colitis Other    Asthma Cousin     BP 108/68   Pulse 70   Ht 5' 3.5" (1.613 m)   Wt 130 lb (59 kg)   SpO2 99%   BMI 22.67 kg/m    Review of Systems     Objective:   Physical Exam NECK: 2-3 cm left thyroid nodule is again noted.  No palpable lymphadenopathy.     Lab Results  Component Value Date   TSH 1.25 08/29/2020      Assessment & Plan:  Thyroid nodule, due for recheck.  No medication is needed.    Patient Instructions  Let's recheck the ultrasound.  you will receive a phone call, about a day and time for an appointment.  We hope this result says no follow up is needed.  If you  need a biopsy, I would be happy to do it here.

## 2021-02-24 NOTE — Patient Instructions (Addendum)
Let's recheck the ultrasound.  you will receive a phone call, about a day and time for an appointment.  We hope this result says no follow up is needed.  If you need a biopsy, I would be happy to do it here.

## 2021-03-05 ENCOUNTER — Ambulatory Visit
Admission: RE | Admit: 2021-03-05 | Discharge: 2021-03-05 | Disposition: A | Discharge status: Home / Self Care | Payer: BC Managed Care – PPO | Source: Ambulatory Visit | Attending: Endocrinology | Admitting: Endocrinology

## 2021-03-05 ENCOUNTER — Other Ambulatory Visit: Payer: Self-pay

## 2021-03-05 DIAGNOSIS — E041 Nontoxic single thyroid nodule: Secondary | ICD-10-CM

## 2021-08-24 LAB — COLOGUARD: COLOGUARD: NEGATIVE

## 2021-09-18 ENCOUNTER — Ambulatory Visit: Payer: BC Managed Care – PPO | Admitting: Internal Medicine

## 2021-09-29 ENCOUNTER — Encounter: Payer: Self-pay | Admitting: Internal Medicine

## 2021-09-29 ENCOUNTER — Ambulatory Visit: Payer: BC Managed Care – PPO | Admitting: Internal Medicine

## 2021-09-29 ENCOUNTER — Other Ambulatory Visit: Payer: Self-pay

## 2021-09-29 VITALS — BP 118/84 | HR 75 | Resp 18 | Ht 63.5 in | Wt 137.6 lb

## 2021-09-29 DIAGNOSIS — R42 Dizziness and giddiness: Secondary | ICD-10-CM

## 2021-09-29 DIAGNOSIS — K219 Gastro-esophageal reflux disease without esophagitis: Secondary | ICD-10-CM | POA: Diagnosis not present

## 2021-09-29 DIAGNOSIS — E041 Nontoxic single thyroid nodule: Secondary | ICD-10-CM | POA: Diagnosis not present

## 2021-09-29 DIAGNOSIS — Z0000 Encounter for general adult medical examination without abnormal findings: Secondary | ICD-10-CM

## 2021-09-29 DIAGNOSIS — Z136 Encounter for screening for cardiovascular disorders: Secondary | ICD-10-CM | POA: Diagnosis not present

## 2021-09-29 MED ORDER — PANTOPRAZOLE SODIUM 40 MG PO TBEC: 40.0000 mg | DELAYED_RELEASE_TABLET | Freq: Every day | ORAL | 3 refills | Status: DC

## 2021-09-29 NOTE — Progress Notes (Signed)
° °  Subjective:   Patient ID: Deborah Ortega, female    DOB: 05-24-1966, 56 y.o.   MRN: [id number]  HPI The patient is a 56 YO female coming in for dizziness (feeling disconnected to self, prior brain cancer recent MR done without recurrence or changes). And GERD.  Review of Systems  Constitutional: Negative.   HENT: Negative.    Eyes: Negative.   Respiratory:  Negative for cough, chest tightness and shortness of breath.   Cardiovascular:  Negative for chest pain, palpitations and leg swelling.  Gastrointestinal:  Negative for abdominal distention, abdominal pain, constipation, diarrhea, nausea and vomiting.       GERD  Musculoskeletal: Negative.   Skin: Negative.   Neurological:  Positive for dizziness.  Psychiatric/Behavioral: Negative.     Objective:  Physical Exam Constitutional:      Appearance: She is well-developed.  HENT:     Head: Normocephalic and atraumatic.  Eyes:     Extraocular Movements: Extraocular movements intact.     Pupils: Pupils are equal, round, and reactive to light.  Cardiovascular:     Rate and Rhythm: Normal rate and regular rhythm.  Pulmonary:     Effort: Pulmonary effort is normal. No respiratory distress.     Breath sounds: Normal breath sounds. No wheezing or rales.  Abdominal:     General: Bowel sounds are normal. There is no distension.     Palpations: Abdomen is soft.     Tenderness: There is no abdominal tenderness. There is no rebound.  Musculoskeletal:     Cervical back: Normal range of motion.  Skin:    General: Skin is warm and dry.  Neurological:     Mental Status: She is alert and oriented to person, place, and time.     Coordination: Coordination normal.    Vitals:   09/29/21 1550  BP: 118/84  Pulse: 75  Resp: 18  SpO2: 95%  Weight: 137 lb 9.6 oz (62.4 kg)  Height: 5' 3.5" (1.613 m)    This visit occurred during the SARS-CoV-2 public health emergency.  Safety protocols were in place, including screening questions prior  to the visit, additional usage of staff PPE, and extensive cleaning of exam room while observing appropriate contact time as indicated for disinfecting solutions.   Assessment & Plan:  Visit time 20 minutes in face to face communication with patient and coordination of care, additional 10 minutes spent in record review, coordination or care, ordering tests, communicating/referring to other healthcare professionals, documenting in medical records all on the same day of the visit for total time 30 minutes spent on the visit.

## 2021-09-29 NOTE — Patient Instructions (Addendum)
We will check some labs today to rule out some things.  We have sent in protonix for the stomach to take 1 pill daily.   If the labs are normal we can try lexapro 1/2 pill daily we will let you know.

## 2021-09-30 LAB — COMPREHENSIVE METABOLIC PANEL
ALT: 12 U/L (ref 0–35)
AST: 19 U/L (ref 0–37)
Albumin: 4.5 g/dL (ref 3.5–5.2)
Alkaline Phosphatase: 66 U/L (ref 39–117)
BUN: 11 mg/dL (ref 6–23)
CO2: 24 mEq/L (ref 19–32)
Calcium: 9.8 mg/dL (ref 8.4–10.5)
Chloride: 105 mEq/L (ref 96–112)
Creatinine, Ser: 0.83 mg/dL (ref 0.40–1.20)
GFR: 80.24 mL/min (ref >60.00)
Glucose, Bld: 94 mg/dL (ref 70–99)
Potassium: 5.3 mEq/L — ABNORMAL HIGH (ref 3.5–5.1)
Sodium: 143 mEq/L (ref 135–145)
Total Bilirubin: 0.3 mg/dL (ref 0.2–1.2)
Total Protein: 8 g/dL (ref 6.0–8.3)

## 2021-09-30 LAB — CBC
HCT: 43 % (ref 36.0–46.0)
Hemoglobin: 14.3 g/dL (ref 12.0–15.0)
MCHC: 33.2 g/dL (ref 30.0–36.0)
MCV: 85 fl (ref 78.0–100.0)
Platelets: 287 10*3/uL (ref 150.0–400.0)
RBC: 5.06 Mil/uL (ref 3.87–5.11)
RDW: 13.4 % (ref 11.5–15.5)
WBC: 7.8 10*3/uL (ref 4.0–10.5)

## 2021-09-30 LAB — LIPID PANEL
Cholesterol: 220 mg/dL — ABNORMAL HIGH (ref 0–200)
HDL: 55.4 mg/dL (ref >39.00)
LDL Cholesterol: 129 mg/dL — ABNORMAL HIGH (ref 0–99)
NonHDL: 164.29
Total CHOL/HDL Ratio: 4
Triglycerides: 178 mg/dL — ABNORMAL HIGH (ref 0.0–149.0)
VLDL: 35.6 mg/dL (ref 0.0–40.0)

## 2021-09-30 LAB — T4, FREE: Free T4: 0.91 ng/dL (ref 0.60–1.60)

## 2021-09-30 LAB — VITAMIN B12: Vitamin B-12: 206 pg/mL — ABNORMAL LOW (ref 211–911)

## 2021-09-30 LAB — VITAMIN D 25 HYDROXY (VIT D DEFICIENCY, FRACTURES): VITD: 32.55 ng/mL (ref 30.00–100.00)

## 2021-09-30 LAB — HEMOGLOBIN A1C: Hgb A1c MFr Bld: 5.5 % (ref 4.6–6.5)

## 2021-09-30 LAB — TSH: TSH: 0.97 u[IU]/mL (ref 0.35–5.50)

## 2021-10-02 DIAGNOSIS — R42 Dizziness and giddiness: Secondary | ICD-10-CM | POA: Insufficient documentation

## 2021-10-02 NOTE — Assessment & Plan Note (Signed)
Rx protonix as she is having a flare of GERD.

## 2021-10-02 NOTE — Assessment & Plan Note (Signed)
Checking labs including HgA1c, CBC, CMP, TSH, B12, vitamin D, free T4. If no abnormalities it is possibly side effect of lexapro 20 mg daily and will plan to decrease to 10 mg daily. If abnormal lab will treat this. MRI normal recently.

## 2021-10-07 ENCOUNTER — Ambulatory Visit: Payer: BC Managed Care – PPO

## 2021-10-09 ENCOUNTER — Ambulatory Visit (INDEPENDENT_AMBULATORY_CARE_PROVIDER_SITE_OTHER): Payer: BC Managed Care – PPO

## 2021-10-09 ENCOUNTER — Other Ambulatory Visit: Payer: Self-pay

## 2021-10-09 DIAGNOSIS — E538 Deficiency of other specified B group vitamins: Secondary | ICD-10-CM | POA: Diagnosis not present

## 2021-10-09 MED ORDER — CYANOCOBALAMIN 1000 MCG/ML IJ SOLN
1000.0000 ug | Freq: Once | INTRAMUSCULAR | Status: AC
  Administered 2021-10-09: 1000 ug via INTRAMUSCULAR

## 2021-10-09 NOTE — Progress Notes (Signed)
After obtaining consent, and per orders of Dr. Sharlet Salina, injection of B-12 given by Archie Balboa in left arm. Patient tolerated injection well.

## 2021-10-09 NOTE — Progress Notes (Cosign Needed)
Pt was given B12 w/o any complications. 

## 2021-10-25 ENCOUNTER — Other Ambulatory Visit: Payer: Self-pay | Admitting: Internal Medicine

## 2021-10-30 ENCOUNTER — Ambulatory Visit (INDEPENDENT_AMBULATORY_CARE_PROVIDER_SITE_OTHER): Payer: BC Managed Care – PPO

## 2021-10-30 ENCOUNTER — Other Ambulatory Visit: Payer: Self-pay

## 2021-10-30 DIAGNOSIS — E538 Deficiency of other specified B group vitamins: Secondary | ICD-10-CM

## 2021-10-30 MED ORDER — CYANOCOBALAMIN 1000 MCG/ML IJ SOLN
1000.0000 ug | Freq: Once | INTRAMUSCULAR | Status: AC
  Administered 2021-10-30: 1000 ug via INTRAMUSCULAR

## 2021-10-30 NOTE — Progress Notes (Signed)
Pt here for monthly B12 injection  ? ?B12 1058mg given IM, and pt tolerated injection well. ? ?Next B12 injection scheduled for 11/13/21 ? ?

## 2021-11-13 ENCOUNTER — Ambulatory Visit: Payer: BC Managed Care – PPO

## 2021-11-18 ENCOUNTER — Ambulatory Visit: Payer: BC Managed Care – PPO

## 2021-11-25 ENCOUNTER — Ambulatory Visit (INDEPENDENT_AMBULATORY_CARE_PROVIDER_SITE_OTHER): Payer: BC Managed Care – PPO

## 2021-11-25 DIAGNOSIS — E538 Deficiency of other specified B group vitamins: Secondary | ICD-10-CM

## 2021-11-25 MED ORDER — CYANOCOBALAMIN 1000 MCG/ML IJ SOLN
1000.0000 ug | Freq: Once | INTRAMUSCULAR | Status: AC
  Administered 2021-11-25: 1000 ug via INTRAMUSCULAR

## 2021-11-25 NOTE — Progress Notes (Addendum)
Pt here for 3rd biweekly B12 injection per Dr. Sharlet Salina ? ?B12 1015mg given IM, and pt tolerated injection well. ? ?Next B12 injection scheduled for 12/09/21 ? ? ?Medical screening examination/treatment/procedure(s) were performed by non-physician practitioner and as supervising physician I was immediately available for consultation/collaboration.  I agree with above. ALew Dawes MD ? ?

## 2021-12-09 ENCOUNTER — Ambulatory Visit: Payer: BC Managed Care – PPO

## 2021-12-10 ENCOUNTER — Ambulatory Visit (INDEPENDENT_AMBULATORY_CARE_PROVIDER_SITE_OTHER): Payer: BC Managed Care – PPO

## 2021-12-10 DIAGNOSIS — E538 Deficiency of other specified B group vitamins: Secondary | ICD-10-CM

## 2021-12-10 MED ORDER — CYANOCOBALAMIN 1000 MCG/ML IJ SOLN
1000.0000 ug | Freq: Once | INTRAMUSCULAR | Status: AC
  Administered 2021-12-10: 1000 ug via INTRAMUSCULAR

## 2021-12-10 NOTE — Progress Notes (Signed)
After obtaining consent, and per orders of Dr. Sharlet Salina, injection of B12 given by Max Sane. Patient tolerated injection well in left deltoid and was informed to report any adverse reaction to me immediately.  ?

## 2021-12-16 ENCOUNTER — Other Ambulatory Visit: Payer: Self-pay | Admitting: Urology

## 2021-12-16 NOTE — Progress Notes (Signed)
Patient to arrive at 1430 on 12/16/2021. History and medications reviewed. Pre-procedure instructions given. NPO after 1030 tomorrow except for clear liquids until 1230. Driver secured. ?

## 2021-12-17 ENCOUNTER — Encounter (HOSPITAL_BASED_OUTPATIENT_CLINIC_OR_DEPARTMENT_OTHER)
Admission: RE | Disposition: A | Discharge status: Home / Self Care | Payer: Self-pay | Source: Home / Self Care | Attending: Urology

## 2021-12-17 ENCOUNTER — Other Ambulatory Visit: Payer: Self-pay

## 2021-12-17 ENCOUNTER — Encounter (HOSPITAL_BASED_OUTPATIENT_CLINIC_OR_DEPARTMENT_OTHER): Payer: Self-pay | Admitting: Anesthesiology

## 2021-12-17 ENCOUNTER — Other Ambulatory Visit: Payer: Self-pay | Admitting: Urology

## 2021-12-17 ENCOUNTER — Ambulatory Visit (HOSPITAL_COMMUNITY): Payer: BC Managed Care – PPO

## 2021-12-17 ENCOUNTER — Ambulatory Visit (HOSPITAL_BASED_OUTPATIENT_CLINIC_OR_DEPARTMENT_OTHER)
Admission: RE | Admit: 2021-12-17 | Discharge: 2021-12-17 | Disposition: A | Discharge status: Home / Self Care | Payer: BC Managed Care – PPO | Attending: Urology | Admitting: Urology

## 2021-12-17 ENCOUNTER — Encounter (HOSPITAL_BASED_OUTPATIENT_CLINIC_OR_DEPARTMENT_OTHER): Payer: Self-pay | Admitting: Urology

## 2021-12-17 DIAGNOSIS — N201 Calculus of ureter: Secondary | ICD-10-CM | POA: Insufficient documentation

## 2021-12-17 DIAGNOSIS — F418 Other specified anxiety disorders: Secondary | ICD-10-CM | POA: Diagnosis not present

## 2021-12-17 HISTORY — PX: EXTRACORPOREAL SHOCK WAVE LITHOTRIPSY: SHX1557

## 2021-12-17 LAB — POCT PREGNANCY, URINE: Preg Test, Ur: NEGATIVE

## 2021-12-17 SURGERY — LITHOTRIPSY, ESWL
Anesthesia: LOCAL | Laterality: Left

## 2021-12-17 MED ORDER — CEFAZOLIN SODIUM-DEXTROSE 2-4 GM/100ML-% IV SOLN
INTRAVENOUS | Status: AC
  Filled 2021-12-17: qty 100

## 2021-12-17 MED ORDER — DIAZEPAM 5 MG PO TABS
10.0000 mg | ORAL_TABLET | ORAL | Status: AC
  Administered 2021-12-17: 10 mg via ORAL

## 2021-12-17 MED ORDER — SULFAMETHOXAZOLE-TRIMETHOPRIM 800-160 MG PO TABS: 1.0000 | ORAL_TABLET | Freq: Two times a day (BID) | ORAL | 0 refills | Status: DC

## 2021-12-17 MED ORDER — SODIUM CHLORIDE 0.9 % IV SOLN: INTRAVENOUS | Status: DC

## 2021-12-17 MED ORDER — CEFAZOLIN SODIUM-DEXTROSE 2-4 GM/100ML-% IV SOLN: 2.0000 g | Freq: Three times a day (TID) | INTRAVENOUS | Status: DC

## 2021-12-17 MED ORDER — DIPHENHYDRAMINE HCL 25 MG PO CAPS
ORAL_CAPSULE | ORAL | Status: AC
  Filled 2021-12-17: qty 1

## 2021-12-17 MED ORDER — DIPHENHYDRAMINE HCL 25 MG PO CAPS
25.0000 mg | ORAL_CAPSULE | ORAL | Status: AC
  Administered 2021-12-17: 25 mg via ORAL

## 2021-12-17 MED ORDER — CEFAZOLIN SODIUM-DEXTROSE 2-4 GM/100ML-% IV SOLN: 2.0000 g | Freq: Once | INTRAVENOUS | Status: DC

## 2021-12-17 MED ORDER — DIAZEPAM 5 MG PO TABS
ORAL_TABLET | ORAL | Status: AC
  Filled 2021-12-17: qty 2

## 2021-12-17 NOTE — Progress Notes (Signed)
Office notified [facility] of surgeon change for lithotripsy to Dr Junious Silk, the on call physician from original consented surgeon Dr Matilde Sprang after pt was transferred to lithotripsy truck. Attending change and procedural provider changed in Epic per MD request. Lithotripsy staff notified of change, and to update original consent to reflect MD change.  ?

## 2021-12-17 NOTE — H&P (Signed)
The patient woke up early this morning with left-sided pain. She has been vomiting. No fever. Still has pain. She has not seen blood in the urine. She has been seen here before and has had stents and ureteroscopy for stones. Apparently I do not think she passes a lot of stone spontaneously  ? ?No cystitis symptoms  ? ?CT scan in 2020 showed bilateral stones the largest on the left side was 6 mm  ? ?No CVA or abdominal tenderness  ? ?I reviewed the patient's CT scan. She looks to have approximately a 7 to 8 mm stone proximal left ureter at the interface of L2 and L3. I reviewed it with my partner and there is no distal stone. There is a calcification pelvis but not in the bladder.  ? ?Picture was drawn. Medical therapy with Flomax pain medicine and nausea medicine discussed and prescription sent. Strainer given. I went over lithotripsy in full detail with usual template. She understands that sometimes patients need ureteroscopy or a stent if they are having a lot of issues postoperatively. She is not on aspirin or blood thinner. Clinically not infected. Urine was sent for culture. We talked briefly about ureteroscopy. She like to have lithotripsy tomorrow and I thought this was a good idea in her case based on stone size and past history and current symptoms.  ? ?I called and pain medicine nausea medicine and Flomax and gave her a strainer  ? ?Patient had no signs or symptoms of a urinary tract infection. I still thought it was best to give her Bactrim DS 1 tablet twice a day for 7 days because there was few bacteria in the urine. Clinically not infected. Indications go the emergency room discussed  ? ? ? ? ? ?  ?ALLERGIES: Advil TABS ?Levaquin TABS ?Morphine Derivatives ?NSAIDs ?Sudafed ?  ? ?MEDICATIONS: Zyrtec  ?Gas Pill  ?Heartburn & Acid Reflux  ?Iron  ?Vitamin D  ?  ? ?GU PSH: Cystoscopy Insert Stent - 2015 ?Ureteroscopic stone removal - 2015 ? ?  ?   ?PSH Notes: Cystoscopy With Ureteroscopy With Removal Of  Calculus, Cystoscopy With Insertion Of Ureteral Stent Left, Craniotomy (Therapeutic), Breast Surgery Lumpectomy, Cesarean Section  ? ?NON-GU PSH: Cesarean Delivery Only - 2008 ?Remove Breast Lesion - 2015 ? ?  ? ?GU PMH: Flank Pain, Left, Discussed the fact that her flank pain is not secondary to the passage of a stone at this time although she may have passed a small stone previous to her CT scan but they are no ureteral stones now and her urine is completely clear. - 2020 ?Renal calculus, Nephrolithiasis - 2016 ?Ureteral calculus, Calculus of ureter - 2015 ?Overactive bladder, Overactive bladder - 2014 ?  ?   ?PMH Notes: Calculus disease: She has a history of bilateral renal calculi and in 11/15 underwent ureteroscopy and stone extraction on the left side by Dr. Jeffie Pollock. Her CT scan in 11/15 revealed bilateral small renal calculi.  ?Stone analysis: Calcium oxalate  ?Stone risk evaluation: Her serum studies revealed no abnormality. Her 24-hour urine showed a total volume of 970 cc but no other abnormalities.  ?Treatment: Increase fluid intake  ?  ? ?NON-GU PMH: Encounter for general adult medical examination without abnormal findings, Encounter for preventive health examination - 2016 ?Benign neoplasm of meninges, unspecified, Meningioma - 2015 ?Personal history of other diseases of the digestive system, History of esophageal reflux - 2015 ?Personal history of other mental and behavioral disorders, History of depression - 2015 ?Asthma ?  GERD ?  ? ?FAMILY HISTORY: 1 Daughter - Daughter ?1 son - Son ?Kidney Stones - Runs In Family ?Urinary Calculus - Mother, Barbaraann Rondo, Jon Gills  ? ?SOCIAL HISTORY: Marital Status: Married ?Preferred Language: [place]; Ethnicity: Not Hispanic Or Latino; Race: White ?Current Smoking Status: Patient has never smoked.  ? ?Tobacco Use Assessment Completed: Used Tobacco in last 30 days? ?Has never drank.  ?Drinks 2 caffeinated drinks per day. ?  ?  Notes: Caffeine Use, Never a smoker,  Occupation, Number of children, Tobacco Use, Marital History - Currently Married, Alcohol Use  ? ?REVIEW OF SYSTEMS:    ?GU Review Female:   Patient reports leakage of urine, trouble starting your stream, and have to strain to urinate. Patient denies frequent urination, hard to postpone urination, burning /pain with urination, get up at night to urinate, stream starts and stops, and being pregnant.  ?Gastrointestinal (Upper):   Patient reports nausea and vomiting. Patient denies indigestion/ heartburn.  ?Gastrointestinal (Lower):   Patient denies diarrhea and constipation.  ?Constitutional:   Patient denies fever, night sweats, weight loss, and fatigue.  ?Skin:   Patient denies skin rash/ lesion and itching.  ?Eyes:   Patient denies blurred vision and double vision.  ?Ears/ Nose/ Throat:   Patient denies sore throat and sinus problems.  ?Hematologic/Lymphatic:   Patient denies swollen glands and easy bruising.  ?Cardiovascular:   Patient denies leg swelling and chest pains.  ?Respiratory:   Patient reports cough. Patient denies shortness of breath.  ?Endocrine:   Patient denies excessive thirst.  ?Musculoskeletal:   Patient reports back pain. Patient denies joint pain.  ?Neurological:   Patient denies headaches and dizziness.  ?Psychologic:   Patient denies depression and anxiety.  ? ?Notes: kidney stone  ?  ? ?VITAL SIGNS:    ?  12/16/2021 09:32 AM  ?Weight 135 lb / 61.23 kg  ?Height 63 in / 160.02 cm  ?BP 114/76 mmHg  ?Pulse 68 /min  ?Temperature 96.9 F / 36.0 C  ?BMI 23.9 kg/m?  ? ?PAST DATA REVIEW: None  ? ?PROCEDURES:    ?     C.T. Urogram - [id number]  ?  ?  ?Patient confirmed No Neulasta OnPro Device.  ? ? ?     Urinalysis w/Scope ?Dipstick Dipstick Cont'd Micro  ?Color: Yellow Bilirubin: Neg mg/dL WBC/hpf: 0 - 5/hpf  ?Appearance: Cloudy Ketones: Trace mg/dL RBC/hpf: 20 - 40/hpf  ?Specific Gravity: 1.020 Blood: 3+ ery/uL Bacteria: Few (10-25/hpf)  ?pH: 7.0 Protein: 1+ mg/dL Cystals: Amorph Phosphates  ?Glucose:  Neg mg/dL Urobilinogen: 0.2 mg/dL Casts: NS (Not Seen)  ?  Nitrites: Neg Trichomonas: Not Present  ?  Leukocyte Esterase: Neg leu/uL Mucous: Not Present  ?    Epithelial Cells: 0 - 5/hpf  ?    Yeast: NS (Not Seen)  ?    Sperm: Not Present  ? ? ?ASSESSMENT:  ?    ICD-10 Details  ?1 GU:   Ureteral calculus - N20.1   ?  ? ?PLAN:    ? ? ?      Medications ?New Meds: Bactrim Ds 800 mg-160 mg tablet 1 tablet PO BID   #14  0 Refill(s)  ?Flomax 0.4 mg capsule 1 capsule PO Daily   #21  1 Refill(s)  ?Percocet 5 mg-325 mg tablet 1-2 tablet PO Q 6 H PRN   #14  0 Refill(s)  ?Ondansetron Hcl 4 mg tablet 1 tablet PO Q 8 H PRN   #10  0 Refill(s)  ?Pharmacy Name:  [id number] ?[address]  [address] ? [place] [address] ?Phone:  [phone] ?Fax:  [phone] ?  ? ? ?      Orders ?Labs CULTURE, URINE  ?X-Rays: C.T. Stone Protocol Without I.V. Contrast  ?X-Ray Notes: History: ? ?Hematuria: Yes/No ? ?Patient to see MD after exam: Yes/No ? ?Previous exam: CT / IVP/ US/ KUB/ None ? ?When: ? ?Where: ? ?Diabetic: Yes/ No ? ?BUN/ Creatinine: ? ?Date of last BUN Creatinine: ? ?Weight in pounds: ? ?Allergy- IV Contrast: Yes/ No ? ?Conflicting diabetic meds: Yes/ No ? ?Diabetic Meds: ? ?Prior Authorization #: SLuberta Robertson#: [id number] 12/16/2021 thru 01/14/2022 ? ?  ? ?

## 2021-12-17 NOTE — H&P (Addendum)
History and Physical Interval Note: ?  ?12/17/2021 ?4:39 PM ?  ?Deborah Ortega  has presented today for surgery, with the diagnosis of LEFT URETERAL STONE.  The various methods of treatment have been discussed with the patient and family. After consideration of risks, benefits and other options for treatment, the patient has consented to  Procedure(s): ?LEFTEXTRACORPOREAL SHOCK WAVE LITHOTRIPSY (ESWL) (Left) as a surgical intervention.  The patient's history has been reviewed, patient examined, no change in status, stable for surgery.  I have reviewed the patient's chart, imaging and labs.  Questions were answered to the patient's satisfaction. I discussed patient with Dr. Matilde Sprang. He is not available and as the on call doctor, I stepped in.  ? ? ?Add: on further review patient was seen yesterday and given a Rx for Bactrim which she has not started. Therefore, I will give her cefazolin 2 g IV.  ?

## 2021-12-17 NOTE — Interval H&P Note (Signed)
History and Physical Interval Note: ? ?12/17/2021 ?3:02 PM ? ?Deborah Ortega  has presented today for surgery, with the diagnosis of LEFT URETERAL STONE.  The various methods of treatment have been discussed with the patient and family. After consideration of risks, benefits and other options for treatment, the patient has consented to  Procedure(s): ?LEFTEXTRACORPOREAL SHOCK WAVE LITHOTRIPSY (ESWL) (Left) as a surgical intervention.  The patient's history has been reviewed, patient examined, no change in status, stable for surgery.  I have reviewed the patient's chart and labs.  Questions were answered to the patient's satisfaction.   ? ? ?Inari Shin A Kindle Strohmeier ? ? ?

## 2021-12-17 NOTE — Op Note (Signed)
8 mm Left proximal stone  ? ?LEFT ESWL ? ?Findings: the stone faded well. She may need a staged procedure should she fail to pass the stone and/or the fragments.  ?

## 2021-12-18 ENCOUNTER — Encounter (HOSPITAL_BASED_OUTPATIENT_CLINIC_OR_DEPARTMENT_OTHER): Payer: Self-pay | Admitting: Urology

## 2021-12-22 ENCOUNTER — Telehealth: Payer: Self-pay | Admitting: Internal Medicine

## 2021-12-22 NOTE — Telephone Encounter (Signed)
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   The school has told her that she has to have a note from a doctor saying that she should be allowed to drink water through out the test.  Please advise.  Patient would like that note to be emailed to here at her email address.  [email or web address] ?

## 2021-12-23 ENCOUNTER — Encounter: Payer: Self-pay | Admitting: Internal Medicine

## 2021-12-23 NOTE — Telephone Encounter (Signed)
Okay to do note, she had recent kidney stone and needs to drink frequently.  ?

## 2021-12-24 NOTE — Progress Notes (Signed)
Letter has been completed

## 2021-12-24 NOTE — Telephone Encounter (Signed)
Note has been completed on sent to the patient via mychart as requested. Patient has been made aware ?

## 2021-12-26 ENCOUNTER — Encounter: Payer: Self-pay | Admitting: Emergency Medicine

## 2021-12-26 ENCOUNTER — Ambulatory Visit
Admission: EM | Admit: 2021-12-26 | Discharge: 2021-12-26 | Disposition: A | Discharge status: Home / Self Care | Payer: BC Managed Care – PPO

## 2021-12-26 DIAGNOSIS — L03116 Cellulitis of left lower limb: Secondary | ICD-10-CM | POA: Diagnosis not present

## 2021-12-26 DIAGNOSIS — S90862A Insect bite (nonvenomous), left foot, initial encounter: Secondary | ICD-10-CM | POA: Diagnosis not present

## 2021-12-26 DIAGNOSIS — W57XXXA Bitten or stung by nonvenomous insect and other nonvenomous arthropods, initial encounter: Secondary | ICD-10-CM | POA: Diagnosis not present

## 2021-12-26 NOTE — ED Triage Notes (Addendum)
Patient c/o LFT foot swelling x 1 day.  ? ?Patient denies fall or trauma.  ? ?Patient endorses onset of symptoms began with "intense itching".  ? ?Patient endorses redness and "fluid filled" swelling.  ? ?Patient took Tylenol PM last night with some relief of symptoms.  ? ?Patient has used hydrocortisone swelling.  ? ?Patient reports she's currently taking an antibiotic and AZO due to recent kidney stone episode.  ? ? ?

## 2021-12-26 NOTE — ED Provider Notes (Signed)
?[facility] ? ? ? ?CSN: [id number] ?Arrival date & time: 12/26/21  1103 ? ? ?  ? ?History   ?Chief Complaint ?Chief Complaint  ?Patient presents with  ? Foot Swelling  ? ? ?HPI ?Deborah Ortega is a 56 y.o. female.  Patient presents with painful red swelling on her left foot since yesterday.  She has working during the day when she had itching in the area.  She does not know of any specific insect bite or sting.  The redness has improved today but it is more swollen.  She has been out runnin errands today.  No fever, red streaks, wound drainage, numbness, weakness, paresthesias, or other symptoms.  Treatment at home with hydrocortisone cream.  Patient takes Zyrtec nightly for allergies.  Patient had lithotripsy on 12/17/2021 for left ureteral stone; she is currently on Keflex and Azo.  Her medical history also includes asthma, anemia, hard of hearing, allergic rhinitis, GERD, migraine headache, thyroid nodule, depression, anxiety. ? ?The history is provided by the patient and medical records.  ? ?Past Medical History:  ?Diagnosis Date  ? ALLERGIC RHINITIS   ? Anemia   ? Anxiety   ? Asthma   ? being worked up for asthma now. Chronic cough  ? Depression   ? takes lexapro  ? GERD   ? History of kidney stones   ? HOH (hard of hearing)   ? both ears  ? MENINGIOMA   ? s/p resction 07/2009  ? MIGRAINE HEADACHE   ? PONV (postoperative nausea and vomiting)   ? RENAL CALCULUS, HX OF 06/25/2007, 02/2013  ? THYROID NODULE   ? ? ?Patient Active Problem List  ? Diagnosis Date Noted  ? Dizziness 10/02/2021  ? COVID-19 virus infection 02/05/2021  ? Anemia 03/31/2017  ? History of brain tumor 03/31/2017  ? Routine general medical examination at a health care facility 12/15/2014  ? Cough variant asthma 07/08/2014  ? 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 Upper airway cough syndrome 02/06/2013  ? THYROID NODULE 07/16/2009  ? Allergic rhinitis 07/15/2009  ? GERD 07/15/2009  ? ? ?Past Surgical History:  ?Procedure Laterality  Date  ? ANKLE SURGERY Right 04/13/2018  ? BREAST BIOPSY Right 10/29/2013  ? BREAST LUMPECTOMY WITH RADIOACTIVE SEED LOCALIZATION Right 11/19/2013  ? Procedure: BREAST LUMPECTOMY WITH RADIOACTIVE SEED LOCALIZATION;  Surgeon: Adin Hector, MD;  Location: [facility];  Service: General;  Laterality: Right;  ? CESAREAN SECTION    ? x's 2  ? EXTRACORPOREAL SHOCK WAVE LITHOTRIPSY Left 12/17/2021  ? Procedure: LEFTEXTRACORPOREAL SHOCK WAVE LITHOTRIPSY (ESWL);  Surgeon: Festus Aloe, MD;  Location: [facility];  Service: Urology;  Laterality: Left;  ? Meninogioma Resection  07/25/09  ? pariatal area head-crainiotomy-plate  ? WISDOM TOOTH EXTRACTION    ? ? ?OB History   ?No obstetric history on file. ?  ? ? ? ?Home Medications   ? ?Prior to Admission medications   ?Medication Sig Start Date End Date Taking? Authorizing Provider  ?cephALEXin (KEFLEX) 500 MG capsule Take 500 mg by mouth 3 (three) times daily. 12/23/21  Yes [provider]  ?cetirizine (ZYRTEC) 10 MG tablet Take 10 mg by mouth at bedtime.    Yes [provider]  ?diphenhydramine-acetaminophen (TYLENOL PM) 25-500 MG TABS tablet Take 1 tablet by mouth at bedtime as needed.   Yes [provider]  ?escitalopram (LEXAPRO) 20 MG tablet Take 20 mg by mouth daily.   Yes [provider]  ?  pantoprazole (PROTONIX) 40 MG tablet TAKE 1 TABLET BY MOUTH EVERY DAY 10/26/21  Yes Hoyt Koch, MD  ?acetaminophen (TYLENOL) 500 MG tablet Take 1 tablet (500 mg total) by mouth every 6 (six) hours as needed. 03/30/18   Fawze, Mina A, PA-C  ?cholecalciferol (VITAMIN D3) 25 MCG (1000 UNIT) tablet Take 1,000 Units by mouth daily.    [provider]  ?docusate sodium (COLACE) 250 MG capsule Take 250 mg by mouth daily as needed.    [provider]  ?ferrous sulfate 325 (65 FE) MG tablet Take 325 mg by mouth See admin instructions. Take 1 tablet by mouth with breakfast one to two times per week.     [provider]  ?ondansetron (ZOFRAN) 8 MG tablet Take 4 mg by mouth every 8 (eight) hours as needed for nausea or vomiting.    [provider]  ?oxyCODONE-acetaminophen (PERCOCET/ROXICET) 5-325 MG tablet Take by mouth every 4 (four) hours as needed for severe pain.    [provider]  ?sulfamethoxazole-trimethoprim (BACTRIM DS) 800-160 MG tablet Take 1 tablet by mouth 2 (two) times daily. 12/17/21   Festus Aloe, MD  ? ? ?Family History ?Family History  ?Problem Relation Age of Onset  ? Diabetes Other   ? Hyperlipidemia Other   ? Stroke Other   ? Ulcerative colitis Other   ? Asthma Cousin   ? ? ?Social History ?Social History  ? ?Tobacco Use  ? Smoking status: Never  ? Smokeless tobacco: Never  ? Tobacco comments:  ?  Married, lives with spouse and 2 kids. Pt is a Pharmacist, hospital  ?Substance Use Topics  ? Alcohol use: No  ? Drug use: No  ? ? ? ?Allergies   ?Ibuprofen, Buprenorphine hcl, Levofloxacin, Morphine and related, Tramadol, Adhesive [tape], Aleve [naproxen sodium], Latex, and Phenylephrine-dm ? ? ?Review of Systems ?Review of Systems  ?Constitutional:  Negative for chills and fever.  ?Musculoskeletal:  Negative for gait problem.  ?Skin:  Positive for color change and wound.  ?Neurological:  Negative for weakness and numbness.  ?All other systems reviewed and are negative. ? ? ?Physical Exam ?Triage Vital Signs ?ED Triage Vitals  ?Enc Vitals Group  ?   BP 12/26/21 1111 126/81  ?   Pulse Rate 12/26/21 1111 73  ?   Resp 12/26/21 1111 12  ?   Temp 12/26/21 1111 98 ?F (36.7 ?C)  ?   Temp Source 12/26/21 1111 Oral  ?   SpO2 12/26/21 1111 100 %  ?   Weight --   ?   Height --   ?   Head Circumference --   ?   Peak Flow --   ?   Pain Score 12/26/21 1115 7  ?   Pain Loc --   ?   Pain Edu? --   ?   Excl. in [place]? --   ? ?No data found. ? ?Updated Vital Signs ?BP 126/81 (BP Location: Left Arm)   Pulse 73   Temp 98 ?F (36.7 ?C) (Oral)   Resp 12   LMP  (LMP Unknown)   SpO2 100%  ? ?Visual  Acuity ?Right Eye Distance:   ?Left Eye Distance:   ?Bilateral Distance:   ? ?Right Eye Near:   ?Left Eye Near:    ?Bilateral Near:    ? ?Physical Exam ?Vitals and nursing note reviewed.  ?Constitutional:   ?   General: She is not in acute distress. ?   Appearance: Normal appearance. She is well-developed.  She is not ill-appearing.  ?HENT:  ?   Mouth/Throat:  ?   Mouth: Mucous membranes are moist.  ?Cardiovascular:  ?   Rate and Rhythm: Normal rate and regular rhythm.  ?Pulmonary:  ?   Effort: Pulmonary effort is normal. No respiratory distress.  ?Musculoskeletal:     ?   General: Swelling present. No tenderness or deformity. Normal range of motion.  ?   Cervical back: Neck supple.  ?Skin: ?   General: Skin is warm and dry.  ?   Capillary Refill: Capillary refill takes less than 2 seconds.  ?   Findings: Erythema and lesion present.  ?   Comments: Vesicle at base of left third toe with localized erythema.  No drainage.  See picture for details.  ?Neurological:  ?   General: No focal deficit present.  ?   Mental Status: She is alert and oriented to person, place, and time.  ?   Sensory: No sensory deficit.  ?   Motor: No weakness.  ?   Gait: Gait normal.  ?Psychiatric:     ?   Mood and Affect: Mood normal.     ?   Behavior: Behavior normal.  ? ? ? ? ?UC Treatments / Results  ?Labs ?(all labs ordered are listed, but only abnormal results are displayed) ?Labs Reviewed - No data to display ? ?EKG ? ? ?Radiology ?No results found. ? ?Procedures ?Procedures (including critical care time) ? ?Medications Ordered in UC ?Medications - No data to display ? ?Initial Impression / Assessment and Plan / UC Course  ?I have reviewed the triage vital signs and the nursing notes. ? ?Pertinent labs & imaging results that were available during my care of the patient were reviewed by me and considered in my medical decision making (see chart for details). ? ?  ?Cellulitis of left foot due to insect bite.  This appears to be an insect  bite or sting with localized reaction.  Based on patient's report and pictures she has in her phone, the erythema appears to be decreased today but her foot is more swollen.  She has been out running errands today

## 2021-12-26 NOTE — Discharge Instructions (Addendum)
Elevate your foot to decrease the swelling.  Take Zyrtec or Benadryl as discussed.  Continue taking cephalexin as directed.  Follow-up with your primary care provider on Monday.  Go to the emergency department if you develop worsening symptoms, including increased redness or red streaks or fever or increased swelling or other concerns.   ? ? ?

## 2021-12-28 ENCOUNTER — Ambulatory Visit: Payer: BC Managed Care – PPO | Admitting: Internal Medicine

## 2021-12-28 ENCOUNTER — Encounter: Payer: Self-pay | Admitting: Internal Medicine

## 2021-12-28 DIAGNOSIS — R42 Dizziness and giddiness: Secondary | ICD-10-CM | POA: Diagnosis not present

## 2021-12-28 DIAGNOSIS — L02619 Cutaneous abscess of unspecified foot: Secondary | ICD-10-CM | POA: Diagnosis not present

## 2021-12-28 DIAGNOSIS — L03119 Cellulitis of unspecified part of limb: Secondary | ICD-10-CM | POA: Diagnosis not present

## 2021-12-28 MED ORDER — SULFAMETHOXAZOLE-TRIMETHOPRIM 800-160 MG PO TABS: 1.0000 | ORAL_TABLET | Freq: Two times a day (BID) | ORAL | 0 refills | Status: DC

## 2021-12-28 NOTE — Progress Notes (Signed)
? ?  Subjective:  ? ?Patient ID: Deborah Ortega, female    DOB: 12-Apr-1966, 56 y.o.   MRN: [id number] ? ?HPI ?The patient is a 56 YO female coming in for urgent care follow up.  ? ?Review of Systems  ?Constitutional: Negative.   ?HENT: Negative.    ?Eyes: Negative.   ?Respiratory:  Negative for cough, chest tightness and shortness of breath.   ?Cardiovascular:  Negative for chest pain, palpitations and leg swelling.  ?Gastrointestinal:  Negative for abdominal distention, abdominal pain, constipation, diarrhea, nausea and vomiting.  ?Musculoskeletal: Negative.   ?Skin:  Positive for rash and wound.  ?Neurological: Negative.   ?Psychiatric/Behavioral: Negative.    ? ?Objective:  ?Physical Exam ?Constitutional:   ?   Appearance: She is well-developed.  ?HENT:  ?   Head: Normocephalic and atraumatic.  ?Cardiovascular:  ?   Rate and Rhythm: Normal rate and regular rhythm.  ?Pulmonary:  ?   Effort: Pulmonary effort is normal. No respiratory distress.  ?   Breath sounds: Normal breath sounds. No wheezing or rales.  ?Abdominal:  ?   General: Bowel sounds are normal. There is no distension.  ?   Palpations: Abdomen is soft.  ?   Tenderness: There is no abdominal tenderness. There is no rebound.  ?Musculoskeletal:  ?   Cervical back: Normal range of motion.  ?Skin: ?   General: Skin is warm and dry.  ?   Findings: Rash present.  ?   Comments: Left foot rash on the foot near the toes marked with black dots and appears to be receding from the dots. Small pustule/wound between 2-3rd toes without drainage, minimal tenderness  ?Neurological:  ?   Mental Status: She is alert and oriented to person, place, and time.  ?   Coordination: Coordination normal.  ? ? ?Vitals:  ? 12/28/21 1319  ?BP: 118/78  ?Pulse: 77  ?Resp: 18  ?SpO2: 98%  ?Weight: 135 lb 6.4 oz (61.4 kg)  ?Height: '5\' 3"'$  (1.6 m)  ? ? ?Assessment & Plan:  ? ?

## 2021-12-28 NOTE — Patient Instructions (Signed)
We have sent in bactrim to take if needed. If needed take 1 pill twice a day for 5 days. ? ? ?

## 2021-12-29 ENCOUNTER — Encounter: Payer: Self-pay | Admitting: Internal Medicine

## 2021-12-29 DIAGNOSIS — L03119 Cellulitis of unspecified part of limb: Secondary | ICD-10-CM | POA: Insufficient documentation

## 2021-12-29 DIAGNOSIS — L02619 Cutaneous abscess of unspecified foot: Secondary | ICD-10-CM | POA: Insufficient documentation

## 2021-12-29 NOTE — Assessment & Plan Note (Signed)
Still present and B12 shots have not improved symptoms. Given epley maneuver and if not effective will refer to vestibular therapy to help. MRI done in recent past without changes.  ?

## 2021-12-29 NOTE — Assessment & Plan Note (Signed)
She is finishing keflex course and this is getting some better but only 1 day left. She is still having some swelling and pain. Rx bactrim and she can start this 5 day course.  ?

## 2022-01-28 ENCOUNTER — Other Ambulatory Visit: Payer: Self-pay | Admitting: Obstetrics and Gynecology

## 2022-01-28 DIAGNOSIS — Z1231 Encounter for screening mammogram for malignant neoplasm of breast: Secondary | ICD-10-CM

## 2022-02-22 ENCOUNTER — Ambulatory Visit
Admission: RE | Admit: 2022-02-22 | Discharge: 2022-02-22 | Disposition: A | Discharge status: Home / Self Care | Payer: BC Managed Care – PPO | Source: Ambulatory Visit | Attending: Obstetrics and Gynecology | Admitting: Obstetrics and Gynecology

## 2022-02-22 DIAGNOSIS — Z1231 Encounter for screening mammogram for malignant neoplasm of breast: Secondary | ICD-10-CM

## 2022-03-31 ENCOUNTER — Other Ambulatory Visit: Payer: Self-pay | Admitting: Endocrinology

## 2022-03-31 ENCOUNTER — Other Ambulatory Visit: Payer: BC Managed Care – PPO

## 2022-04-15 ENCOUNTER — Ambulatory Visit: Payer: BC Managed Care – PPO | Admitting: Endocrinology

## 2022-04-15 ENCOUNTER — Encounter: Payer: Self-pay | Admitting: Endocrinology

## 2022-04-15 VITALS — BP 126/82 | HR 112 | Ht 63.5 in | Wt 139.2 lb

## 2022-04-15 DIAGNOSIS — E041 Nontoxic single thyroid nodule: Secondary | ICD-10-CM | POA: Diagnosis not present

## 2022-04-15 NOTE — Progress Notes (Signed)
Patient ID: Deborah Ortega, female   DOB: [birth year], 56 y.o.   MRN: [id number]    Reason for Appointment: Thyroid nodule, followup    History of Present Illness:   The patient's thyroid nodule was first discovered in 2003 by her gynecologist on routine exam  This has subsequently been evaluated by ultrasounds and needle aspiration biopsy as detailed below  On her last exam a year ago her nodule was felt to be about 2 to 3 cm on the left side She does not have any other nodules  She has had no difficulty with swallowing  Does not feel like she has any pressure sensation or discomfort in the neck  Thyroid levels have been consistently normal He is here for annual follow-up  Lab Results  Component Value Date   FREET4 0.91 09/29/2021   FREET4 0.78 08/29/2020   FREET4 0.75 02/13/2019   TSH 0.97 09/29/2021   TSH 1.25 08/29/2020   TSH 0.81 02/13/2019    She has had an ultrasound exam in 7/22 which showed the following:  4.3 x 2.5 x 1.5 cm left mid thyroid nodule is not significantly changed since prior study. Prior FNA was performed on 02/27/2002.  Thyroid biopsy reported on 02/28/02 as follows  Satisfactory for evaluation.   FOLLICULAR EPITHELIUM IN A FOCAL MICROFOLLICULAR PATTERN, SEE COMMENT  There are follicular epithelial cells with admixed colloid and  blood with inflammatory cells. In a few of the slides, there is  a microfollicular pattern of growth. This may represent an adenomatous nodule in the setting of a goiter (favored).  However, a follicular lesion (adenoma/follicular neoplasm)  cannot be excluded. Suggest follow-up as clinically indicated.    Allergies as of 04/15/2022       Reactions   Ibuprofen Rash   Buprenorphine Hcl Itching   Levofloxacin    Cannot sleep   Morphine And Related Itching   Tramadol    Extreme sleeplessness/vomiting- can tolerate small dose per pt   Adhesive [tape] Rash   Aleve [naproxen Sodium] Rash   Latex Rash   Phenylephrine-dm  Anxiety        Medication List        Accurate as of April 15, 2022  4:37 PM. If you have any questions, ask your nurse or doctor.          acetaminophen 500 MG tablet Commonly known as: TYLENOL Take 1 tablet (500 mg total) by mouth every 6 (six) hours as needed.   cephALEXin 500 MG capsule Commonly known as: KEFLEX Take 500 mg by mouth 3 (three) times daily.   cetirizine 10 MG tablet Commonly known as: ZYRTEC Take 10 mg by mouth at bedtime.   cholecalciferol 25 MCG (1000 UNIT) tablet Commonly known as: VITAMIN D3 Take 1,000 Units by mouth daily.   diphenhydramine-acetaminophen 25-500 MG Tabs tablet Commonly known as: TYLENOL PM Take 1 tablet by mouth at bedtime as needed.   docusate sodium 250 MG capsule Commonly known as: COLACE Take 250 mg by mouth daily as needed.   escitalopram 20 MG tablet Commonly known as: LEXAPRO Take 20 mg by mouth daily.   ferrous sulfate 325 (65 FE) MG tablet Take 325 mg by mouth See admin instructions. Take 1 tablet by mouth with breakfast one to two times per week.   ondansetron 8 MG tablet Commonly known as: ZOFRAN Take 4 mg by mouth every 8 (eight) hours as needed for nausea or vomiting.   oxyCODONE-acetaminophen 5-325 MG tablet Commonly known as: PERCOCET/ROXICET  Take by mouth every 4 (four) hours as needed for severe pain.   pantoprazole 40 MG tablet Commonly known as: PROTONIX TAKE 1 TABLET BY MOUTH EVERY DAY   sulfamethoxazole-trimethoprim 800-160 MG tablet Commonly known as: BACTRIM DS Take 1 tablet by mouth 2 (two) times daily.        Allergies:  Allergies  Allergen Reactions   Ibuprofen Rash   Buprenorphine Hcl Itching   Levofloxacin     Cannot sleep   Morphine And Related Itching   Tramadol     Extreme sleeplessness/vomiting- can tolerate small dose per pt   Adhesive [Tape] Rash   Aleve [Naproxen Sodium] Rash   Latex Rash   Phenylephrine-Dm Anxiety    Past Medical History:  Diagnosis Date    ALLERGIC RHINITIS    Anemia    Anxiety    Asthma    being worked up for asthma now. Chronic cough   Depression    takes lexapro   GERD    History of kidney stones    HOH (hard of hearing)    both ears   MENINGIOMA    s/p resction 07/2009   MIGRAINE HEADACHE    PONV (postoperative nausea and vomiting)    RENAL CALCULUS, HX OF 06/25/2007, 02/2013   THYROID NODULE     Past Surgical History:  Procedure Laterality Date   ANKLE SURGERY Right 04/13/2018   BREAST BIOPSY Right 10/29/2013   BREAST LUMPECTOMY WITH RADIOACTIVE SEED LOCALIZATION Right 11/19/2013   Procedure: BREAST LUMPECTOMY WITH RADIOACTIVE SEED LOCALIZATION;  Surgeon: Adin Hector, MD;  Location: [facility];  Service: General;  Laterality: Right;   CESAREAN SECTION     x's 2   EXTRACORPOREAL SHOCK WAVE LITHOTRIPSY Left 12/17/2021   Procedure: LEFTEXTRACORPOREAL SHOCK WAVE LITHOTRIPSY (ESWL);  Surgeon: Festus Aloe, MD;  Location: [facility];  Service: Urology;  Laterality: Left;   Meninogioma Resection  07/25/09   pariatal area head-crainiotomy-plate   WISDOM TOOTH EXTRACTION      Family History  Problem Relation Age of Onset   Diabetes Other    Hyperlipidemia Other    Stroke Other    Ulcerative colitis Other    Asthma Cousin     Social History:  reports that she has never smoked. She has never used smokeless tobacco. She reports that she does not drink alcohol and does not use drugs.   Review of Systems:  There is no history of high blood pressure.             No history of Diabetes.             Examination:   BP 126/82   Pulse (!) 112   Ht 5' 3.5" (1.613 m)   Wt 139 lb 3.2 oz (63.1 kg)   SpO2 97%   BMI 24.27 kg/m    General Appearance:  well-looking             THYROID: Thyroid nodule is palpable on the left side with somewhat indistinct margins and is about 3 cm across, smooth fleshy texture No other nodules, tender thyroid  There is no lymphadenopathy     Assessment/Plan:  Thyroid nodule: She has a biopsy-proven benign nodule since 2003 Also has been very stable on ultrasound exams including in 2022 Clinically on exam her nodule is unchanged and is smooth and relatively soft Ultrasound will not be needed unless there is a change in her clinical exam  She will continue annual clinical follow-up  Elayne Snare 04/15/2022  Total visit time for evaluation and management, review of all relevant past records, labs, counseling = 30 minutes

## 2022-05-21 IMAGING — MG DIGITAL SCREENING BILAT W/ TOMO W/ CAD
8 series · 9 of 24 positions shown · non-contrast
Comparison: Previous exam(s).

CLINICAL DATA: Screening.

EXAM:
DIGITAL SCREENING BILATERAL MAMMOGRAM WITH TOMO AND CAD

[L CC synth-2D]
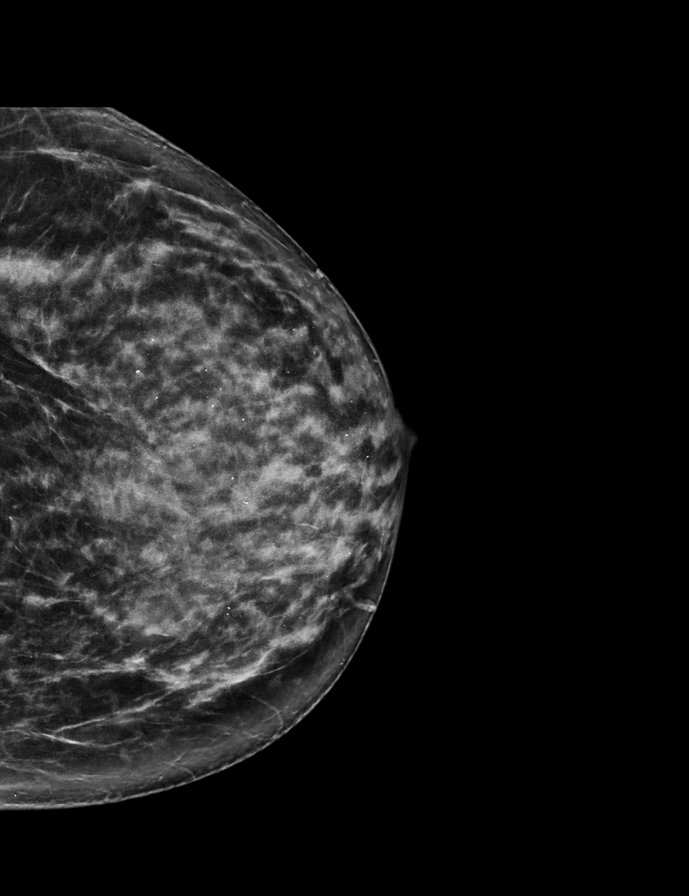

[L MLO synth-2D]
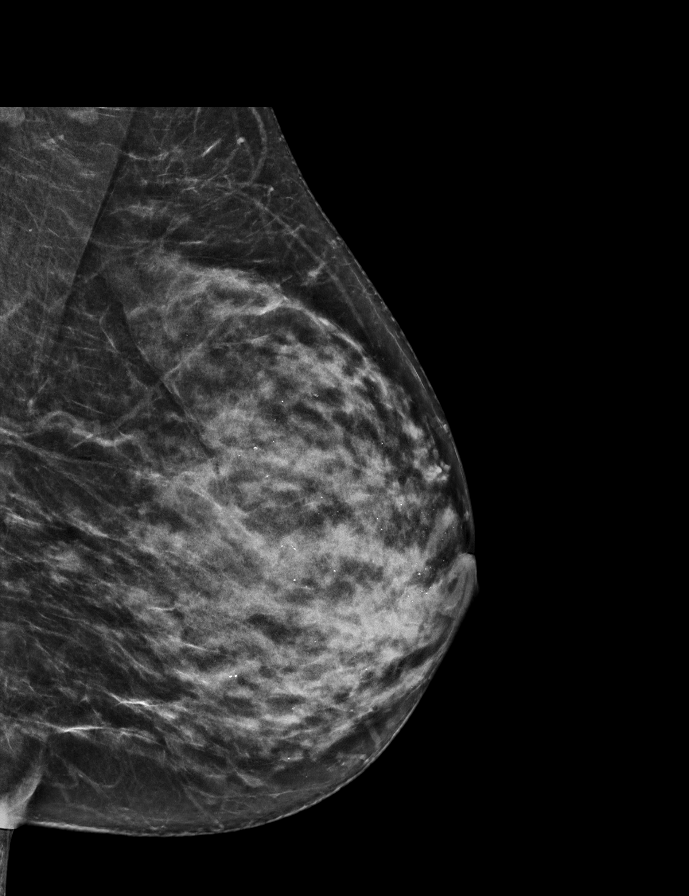

[R MLO synth-2D]
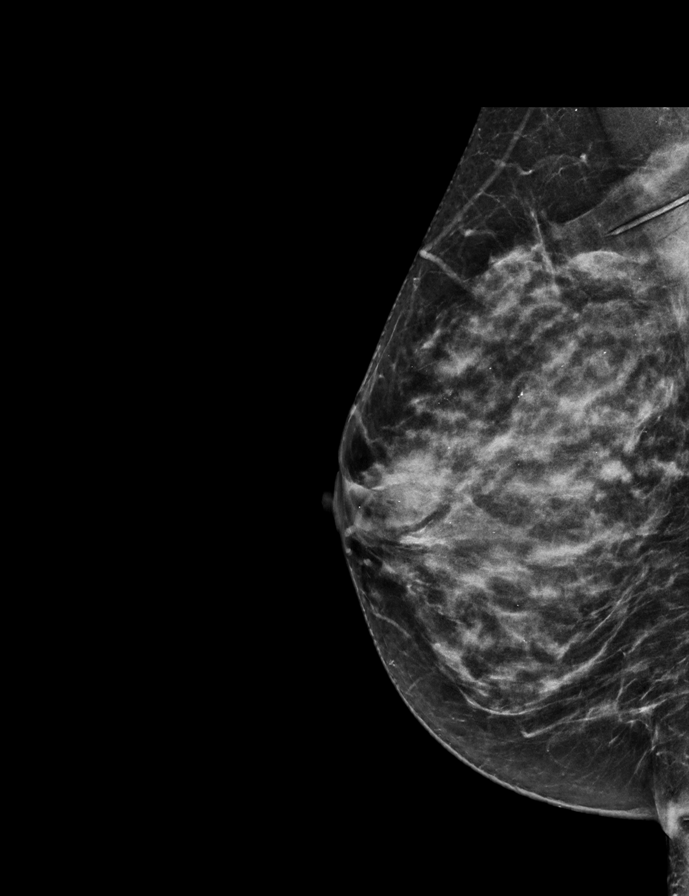

[R CC synth-2D]
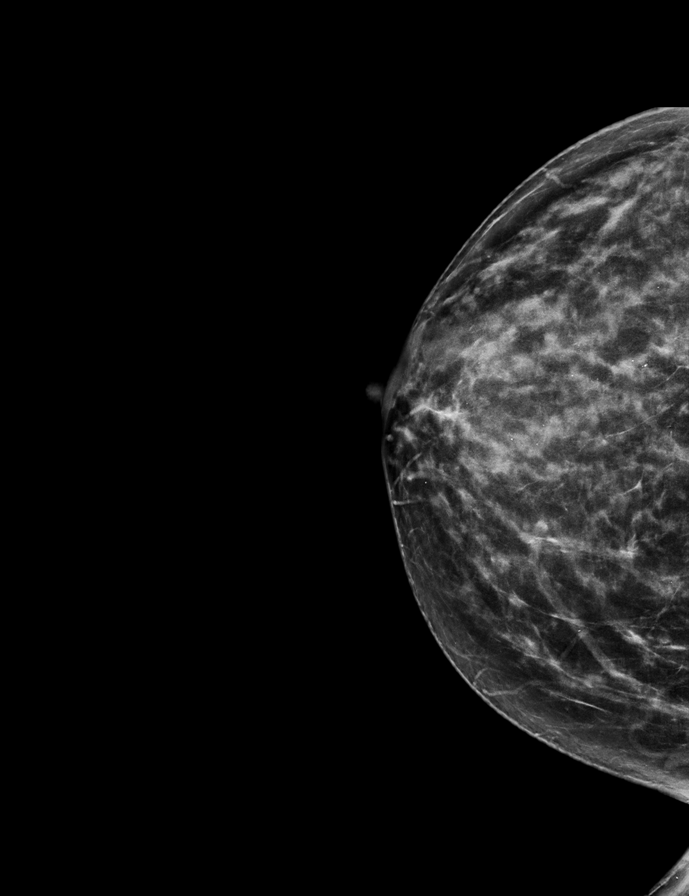

[L MLO tomo · 2 of 61 frames shown]
[frame 20/61]
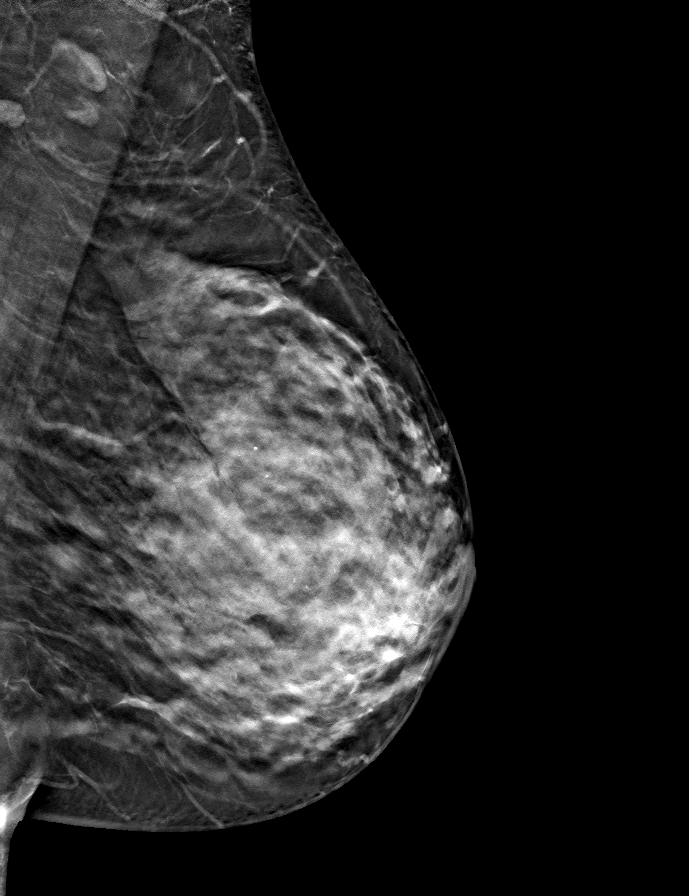
[frame 31/61]
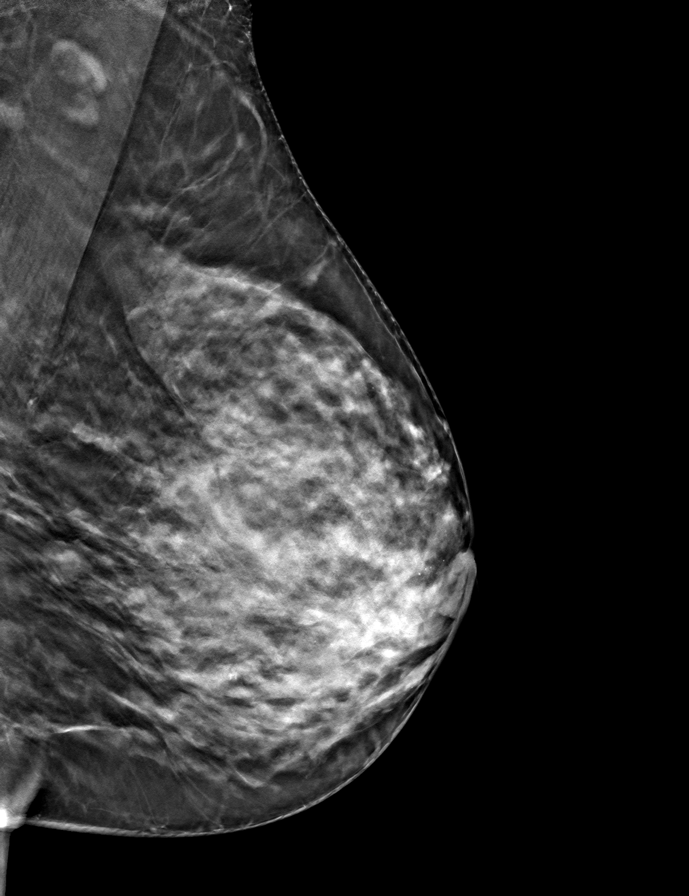

[L CC tomo · tomo slice 33/66.0]
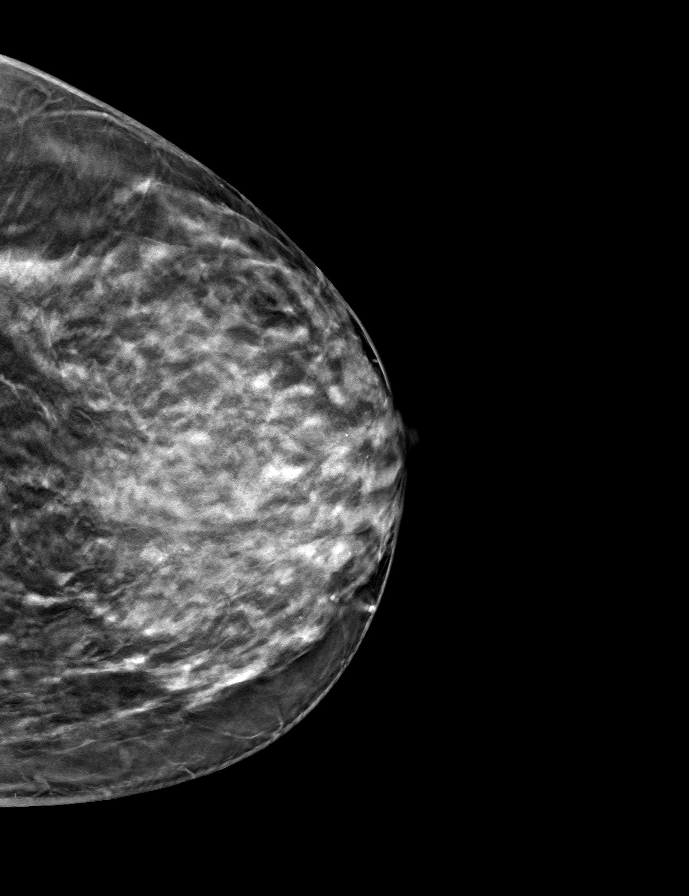

[R CC tomo · tomo slice 31/60.0]
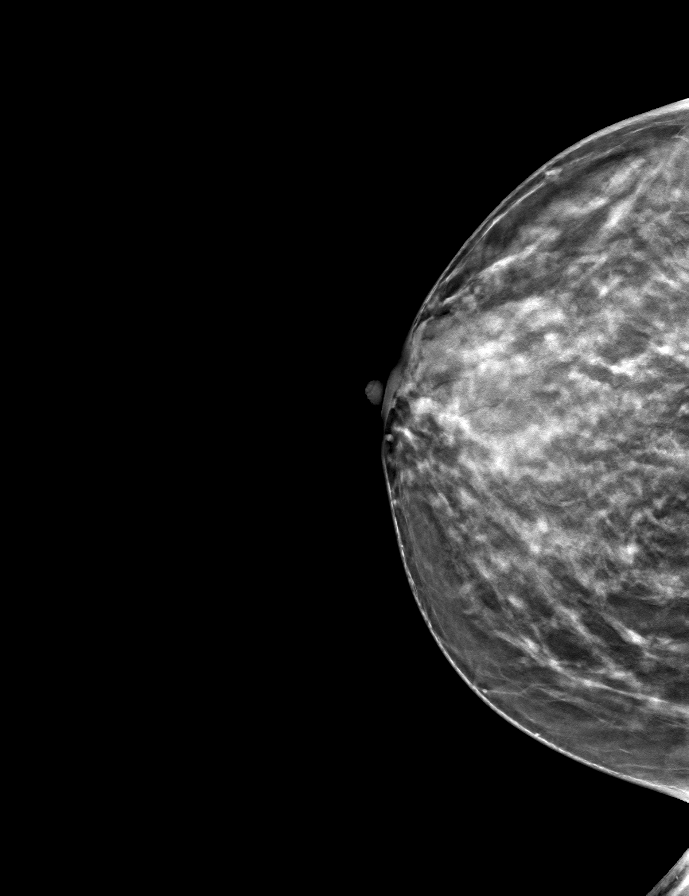

[R MLO tomo · tomo slice 31/61.0]
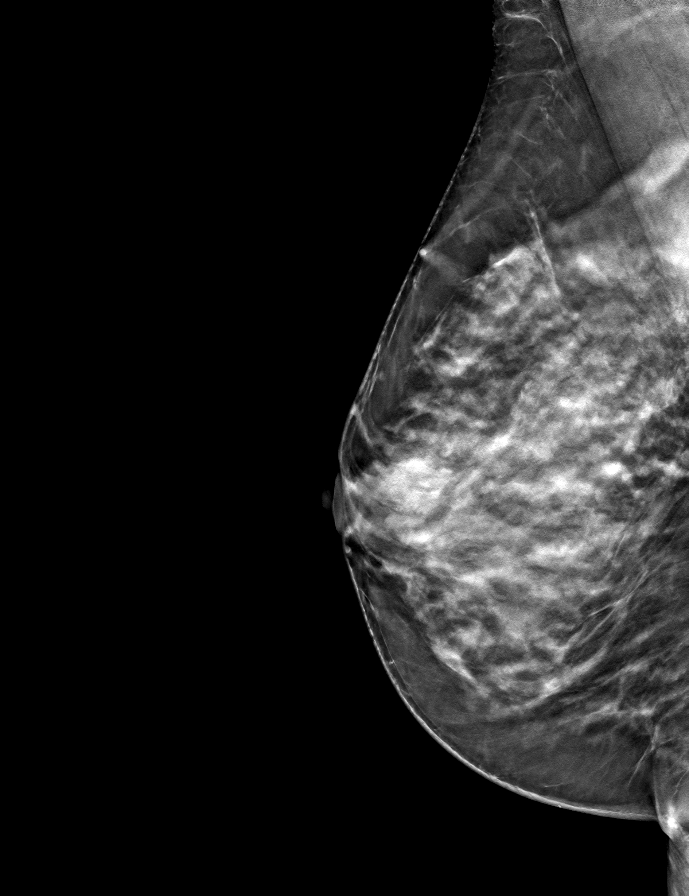

[9 of 24 positions shown; findings below may reference images not displayed]

ACR Breast Density Category d: The breast tissue is extremely dense,
which lowers the sensitivity of mammography
FINDINGS: There are no findings suspicious for malignancy. Images were
processed with CAD.
IMPRESSION: No mammographic evidence of malignancy. A result letter of this
screening mammogram will be mailed directly to the patient.

RECOMMENDATION:
Screening mammogram in one year. (Code:WO-0-ZI0)

BI-RADS CATEGORY  1: Negative.

## 2022-06-17 ENCOUNTER — Other Ambulatory Visit: Payer: Self-pay | Admitting: Internal Medicine

## 2022-07-12 ENCOUNTER — Ambulatory Visit: Payer: BC Managed Care – PPO | Admitting: Neurology

## 2022-07-12 ENCOUNTER — Encounter: Payer: Self-pay | Admitting: Neurology

## 2022-07-12 VITALS — BP 145/96 | HR 84 | Ht 63.6 in | Wt 136.0 lb

## 2022-07-12 DIAGNOSIS — R404 Transient alteration of awareness: Secondary | ICD-10-CM | POA: Diagnosis not present

## 2022-07-12 DIAGNOSIS — R2681 Unsteadiness on feet: Secondary | ICD-10-CM | POA: Diagnosis not present

## 2022-07-12 MED ORDER — MECLIZINE HCL 12.5 MG PO TABS: 12.5000 mg | ORAL_TABLET | Freq: Three times a day (TID) | ORAL | 0 refills | Status: AC | PRN

## 2022-07-12 NOTE — Progress Notes (Signed)
[facility]  PATIENT: Deborah Ortega DOB: [birth year]  REQUESTING CLINICIAN: Arvella Nigh, MD HISTORY FROM: Patient  REASON FOR VISIT: Dizziness, unsteadiness    HISTORICAL  CHIEF COMPLAINT:  Chief Complaint  Patient presents with   New Patient (Initial Visit)    Rm 13. Alone. NP/Paper/[facility]/John McComb MD/vertigo.    HISTORY OF PRESENT ILLNESS:  This is a 56 year old woman past medical history of meningioma status post resection in 2010, anxiety, menopause who is presenting with complaint of dizziness described as feeling unsteadiness.  Patient reports the symptoms started about a year ago, denies any room spinning sensation and states the symptoms are worse with movement or walking.  She denies any nausea or vomiting associated with the unsteadiness, denies any falls.  She has not started any medications for the symptoms.    OTHER MEDICAL CONDITIONS: History of meningioma s/p resection in 2010, menopause, anxiety    REVIEW OF SYSTEMS: Full 14 system review of systems performed and negative with exception of: as noted in the HPI  ALLERGIES: Allergies  Allergen Reactions   Ibuprofen Rash   Buprenorphine Hcl Itching   Dust Mite Extract Itching   Gramineae Pollens Itching   Levofloxacin     Cannot sleep   Morphine And Related Itching   Tramadol     Extreme sleeplessness/vomiting- can tolerate small dose per pt   Adhesive [Tape] Rash   Aleve [Naproxen Sodium] Rash   Latex Rash   Phenylephrine-Dm Anxiety    HOME MEDICATIONS: Outpatient Medications Prior to Visit  Medication Sig Dispense Refill   acetaminophen (TYLENOL) 500 MG tablet Take 1 tablet (500 mg total) by mouth every 6 (six) hours as needed. 30 tablet 0   busPIRone (BUSPAR) 10 MG tablet Take by mouth daily.     cetirizine (ZYRTEC) 10 MG tablet Take 10 mg by mouth at bedtime.      cholecalciferol (VITAMIN D3) 25 MCG (1000 UNIT) tablet Take 1,000 Units by mouth daily.      diphenhydramine-acetaminophen (TYLENOL PM) 25-500 MG TABS tablet Take 1 tablet by mouth at bedtime as needed.     docusate sodium (COLACE) 250 MG capsule Take 250 mg by mouth daily as needed.     escitalopram (LEXAPRO) 20 MG tablet Take 20 mg by mouth daily.     ferrous sulfate 325 (65 FE) MG tablet Take 325 mg by mouth See admin instructions. Take 1 tablet by mouth with breakfast one to two times per week.     pantoprazole (PROTONIX) 40 MG tablet TAKE 1 TABLET BY MOUTH EVERY DAY 90 tablet 1   cephALEXin (KEFLEX) 500 MG capsule Take 500 mg by mouth 3 (three) times daily. (Patient not taking: Reported on 04/15/2022)     ondansetron (ZOFRAN) 8 MG tablet Take 4 mg by mouth every 8 (eight) hours as needed for nausea or vomiting. (Patient not taking: Reported on 04/15/2022)     oxyCODONE-acetaminophen (PERCOCET/ROXICET) 5-325 MG tablet Take by mouth every 4 (four) hours as needed for severe pain. (Patient not taking: Reported on 04/15/2022)     sulfamethoxazole-trimethoprim (BACTRIM DS) 800-160 MG tablet Take 1 tablet by mouth 2 (two) times daily. 10 tablet 0   No facility-administered medications prior to visit.    PAST MEDICAL HISTORY: Past Medical History:  Diagnosis Date   ALLERGIC RHINITIS    Anemia    Anxiety    Asthma    being worked up for asthma now. Chronic cough   Depression    takes  lexapro   GERD    History of kidney stones    HOH (hard of hearing)    both ears   MENINGIOMA    s/p resction 07/2009   MIGRAINE HEADACHE    PONV (postoperative nausea and vomiting)    RENAL CALCULUS, HX OF 06/25/2007, 02/2013   THYROID NODULE     PAST SURGICAL HISTORY: Past Surgical History:  Procedure Laterality Date   ANKLE SURGERY Right 04/13/2018   BREAST BIOPSY Right 10/29/2013   BREAST LUMPECTOMY WITH RADIOACTIVE SEED LOCALIZATION Right 11/19/2013   Procedure: BREAST LUMPECTOMY WITH RADIOACTIVE SEED LOCALIZATION;  Surgeon: Adin Hector, MD;  Location: [place];   Service: General;  Laterality: Right;   CESAREAN SECTION     x's 2   EXTRACORPOREAL SHOCK WAVE LITHOTRIPSY Left 12/17/2021   Procedure: LEFTEXTRACORPOREAL SHOCK WAVE LITHOTRIPSY (ESWL);  Surgeon: Festus Aloe, MD;  Location: [facility];  Service: Urology;  Laterality: Left;   Meninogioma Resection  07/25/09   pariatal area head-crainiotomy-plate   WISDOM TOOTH EXTRACTION      FAMILY HISTORY: Family History  Problem Relation Age of Onset   Diabetes Other    Hyperlipidemia Other    Stroke Other    Ulcerative colitis Other    Asthma Cousin     SOCIAL HISTORY: Social History   Socioeconomic History   Marital status: Married    Spouse name: Not on file   Number of children: Not on file   Years of education: Not on file   Highest education level: Not on file  Occupational History   Not on file  Tobacco Use   Smoking status: Never   Smokeless tobacco: Never   Tobacco comments:    Married, lives with spouse and 2 kids. Pt is a Pharmacist, hospital  Substance and Sexual Activity   Alcohol use: No   Drug use: No   Sexual activity: Not on file  Other Topics Concern   Not on file  Social History Narrative   Not on file   Social Determinants of Health   Financial Resource Strain: Not on file  Food Insecurity: Not on file  Transportation Needs: Not on file  Physical Activity: Not on file  Stress: Not on file  Social Connections: Not on file  Intimate Partner Violence: Not on file    PHYSICAL EXAM  GENERAL EXAM/CONSTITUTIONAL: Vitals:  Vitals:   07/12/22 0832 07/12/22 0835  BP: (!) 137/92 (!) 145/96  Pulse: 88 84  Weight: 136 lb (61.7 kg)   Height: 5' 3.6" (1.615 m)    Body mass index is 23.64 kg/m. Wt Readings from Last 3 Encounters:  07/12/22 136 lb (61.7 kg)  04/15/22 139 lb 3.2 oz (63.1 kg)  12/28/21 135 lb 6.4 oz (61.4 kg)   Patient is in no distress; well developed, nourished and groomed; neck is supple  EYES: Visual fields full to  confrontation, Extraocular movements intacts,   MUSCULOSKELETAL: Gait, strength, tone, movements noted in Neurologic exam below  NEUROLOGIC: MENTAL STATUS:      No data to display         awake, alert, oriented to person, place and time recent and remote memory intact normal attention and concentration language fluent, comprehension intact, naming intact fund of knowledge appropriate  CRANIAL NERVE:  2nd, 3rd, 4th, 6th - Visual fields full to confrontation, extraocular muscles intact, no nystagmus 5th - facial sensation symmetric 7th - facial strength symmetric 8th - hearing intact 9th - palate elevates symmetrically, uvula  midline 11th - shoulder shrug symmetric 12th - tongue protrusion midline  MOTOR:  normal bulk and tone, full strength in the BUE, BLE  SENSORY:  normal and symmetric to light touch  COORDINATION:  finger-nose-finger, fine finger movements normal  GAIT/STATION:  normal     DIAGNOSTIC DATA (LABS, IMAGING, TESTING) - I reviewed patient records, labs, notes, testing and imaging myself where available.  Lab Results  Component Value Date   WBC 7.8 09/29/2021   HGB 14.3 09/29/2021   HCT 43.0 09/29/2021   MCV 85.0 09/29/2021   PLT 287.0 09/29/2021      Component Value Date/Time   NA 143 09/29/2021 1624   K 5.3 (H) 09/29/2021 1624   CL 105 09/29/2021 1624   CO2 24 09/29/2021 1624   GLUCOSE 94 09/29/2021 1624   BUN 11 09/29/2021 1624   CREATININE 0.83 09/29/2021 1624   CALCIUM 9.8 09/29/2021 1624   PROT 8.0 09/29/2021 1624   ALBUMIN 4.5 09/29/2021 1624   AST 19 09/29/2021 1624   ALT 12 09/29/2021 1624   ALKPHOS 66 09/29/2021 1624   BILITOT 0.3 09/29/2021 1624   GFRNONAA >90 11/14/2013 1510   GFRAA >90 11/14/2013 1510   Lab Results  Component Value Date   CHOL 220 (H) 09/29/2021   HDL 55.40 09/29/2021   LDLCALC 129 (H) 09/29/2021   LDLDIRECT 142.6 02/26/2013   TRIG 178.0 (H) 09/29/2021   CHOLHDL 4 09/29/2021   Lab Results   Component Value Date   HGBA1C 5.5 09/29/2021   Lab Results  Component Value Date   VITAMINB12 206 (L) 09/29/2021   Lab Results  Component Value Date   TSH 0.97 09/29/2021    MRI Brain 2010 1.  Solitary, large predominately cystic mass in the right parietal lobe has an intensely enhancing  spiculated soft tissue component which abuts the posterior dura and is in close association with adjacent cortical veins.  There is mild T2 prolongation along the anterior margin of the lesion which could represent nonenhancing tumor.  Appearance strongly favors primary brain tumor.  Differential considerations include PXA, hemangioblastoma, pilocytic astrocytoma, atypical high-grade astrocytoma, and less likely  ganglioglioma or DNET.    ASSESSMENT AND PLAN  56 y.o. year old female with history of meningioma resection in 2010, anxiety, menopause who is presenting with episodes of unsteadiness and dizziness.  Denies any room spinning sensation.  Patient reports the symptoms started about a year ago when she started undergoing menopause, she denies any falls associated with the dizziness, no nausea, no vomiting, no other symptoms.  Due to a previous history of meningioma s/p brain surgery and previous seizure, I will obtain an EEG to rule out seizures, focal aware, and I will also try her on meclizine.  I will contact the patient after completion of the EEG.  Today her orthostatic vitals were normal.  Continue to follow with PCP and return as needed.   1. Transient alteration of awareness   2. Unsteadiness      Patient Instructions  Routine EEG, I will contact you to go over the results Trial of Meclizine  Increase hydration  Return if worse   Orders Placed This Encounter  Procedures   EEG adult    Meds ordered this encounter  Medications   meclizine (ANTIVERT) 12.5 MG tablet    Sig: Take 1 tablet (12.5 mg total) by mouth 3 (three) times daily as needed for dizziness.    Dispense:  30  tablet    Refill:  0  Return if symptoms worsen or fail to improve.    Alric Ran, MD 07/12/2022, 1:44 PM  [facility] [address], [place], [place] [address] [phone]

## 2022-07-12 NOTE — Patient Instructions (Signed)
Routine EEG, I will contact you to go over the results Trial of Meclizine  Increase hydration  Return if worse

## 2022-08-13 ENCOUNTER — Ambulatory Visit: Payer: BC Managed Care – PPO | Admitting: Internal Medicine

## 2022-08-13 ENCOUNTER — Encounter: Payer: Self-pay | Admitting: Internal Medicine

## 2022-08-13 VITALS — BP 122/78 | HR 75 | Temp 97.9°F | Ht 63.0 in | Wt 137.0 lb

## 2022-08-13 DIAGNOSIS — J019 Acute sinusitis, unspecified: Secondary | ICD-10-CM | POA: Insufficient documentation

## 2022-08-13 DIAGNOSIS — J0100 Acute maxillary sinusitis, unspecified: Secondary | ICD-10-CM | POA: Diagnosis not present

## 2022-08-13 DIAGNOSIS — R87619 Unspecified abnormal cytological findings in specimens from cervix uteri: Secondary | ICD-10-CM | POA: Insufficient documentation

## 2022-08-13 DIAGNOSIS — R490 Dysphonia: Secondary | ICD-10-CM | POA: Insufficient documentation

## 2022-08-13 MED ORDER — AZITHROMYCIN 250 MG PO TABS: ORAL_TABLET | ORAL | 0 refills | Status: DC

## 2022-08-13 MED ORDER — PROMETHAZINE-DM 6.25-15 MG/5ML PO SYRP: 5.0000 mL | ORAL_SOLUTION | Freq: Four times a day (QID) | ORAL | 0 refills | Status: DC | PRN

## 2022-08-13 NOTE — Progress Notes (Signed)
Subjective:  Patient ID: Deborah Ortega, female    DOB: Sep 04, 1965  Age: 56 y.o. MRN: [id number]  CC: Nasal Congestion (Head congestion started dec7th, had fever , joints aching , runny nose with discolored mucus )   HPI Deborah Ortega presents for sick since Dec 7th w/URI sx's. C/o fever, achy feeling, nasal d/c  Outpatient Medications Prior to Visit  Medication Sig Dispense Refill   acetaminophen (TYLENOL) 500 MG tablet Take 1 tablet (500 mg total) by mouth every 6 (six) hours as needed. 30 tablet 0   busPIRone (BUSPAR) 10 MG tablet Take by mouth daily.     cetirizine (ZYRTEC) 10 MG tablet Take 10 mg by mouth at bedtime.      cholecalciferol (VITAMIN D3) 25 MCG (1000 UNIT) tablet Take 1,000 Units by mouth daily.     diphenhydramine-acetaminophen (TYLENOL PM) 25-500 MG TABS tablet Take 1 tablet by mouth at bedtime as needed.     docusate sodium (COLACE) 250 MG capsule Take 250 mg by mouth daily as needed.     escitalopram (LEXAPRO) 20 MG tablet Take 20 mg by mouth daily.     ferrous sulfate 325 (65 FE) MG tablet Take 325 mg by mouth See admin instructions. Take 1 tablet by mouth with breakfast one to two times per week.     meclizine (ANTIVERT) 12.5 MG tablet Take 1 tablet (12.5 mg total) by mouth 3 (three) times daily as needed for dizziness. 30 tablet 0   pantoprazole (PROTONIX) 40 MG tablet TAKE 1 TABLET BY MOUTH EVERY DAY 90 tablet 1   triamcinolone (KENALOG) 0.1 % paste APPLY TO ULCERS AFTER MEALS AND AT BEDTIME     No facility-administered medications prior to visit.    ROS: Review of Systems  Constitutional:  Positive for fatigue. Negative for activity change, appetite change, chills and unexpected weight change.  HENT:  Positive for congestion, postnasal drip and rhinorrhea. Negative for mouth sores and sinus pressure.   Eyes:  Negative for visual disturbance.  Respiratory:  Positive for cough. Negative for chest tightness.   Gastrointestinal:  Negative for abdominal pain  and nausea.  Genitourinary:  Negative for difficulty urinating, frequency and vaginal pain.  Musculoskeletal:  Negative for back pain and gait problem.  Skin:  Negative for pallor and rash.  Neurological:  Negative for dizziness, tremors, weakness, numbness and headaches.  Psychiatric/Behavioral:  Negative for confusion and sleep disturbance.     Objective:  BP 122/78 (BP Location: Left Arm, Patient Position: Sitting, Cuff Size: Normal)   Pulse 75   Temp 97.9 F (36.6 C) (Oral)   Ht '5\' 3"'$  (1.6 m)   Wt 137 lb (62.1 kg)   SpO2 96%   BMI 24.27 kg/m   BP Readings from Last 3 Encounters:  08/13/22 122/78  07/12/22 (!) 145/96  04/15/22 126/82    Wt Readings from Last 3 Encounters:  08/13/22 137 lb (62.1 kg)  07/12/22 136 lb (61.7 kg)  04/15/22 139 lb 3.2 oz (63.1 kg)    Physical Exam Constitutional:      General: She is not in acute distress.    Appearance: Normal appearance. She is well-developed.  HENT:     Head: Normocephalic.     Right Ear: External ear normal.     Left Ear: External ear normal.     Nose: Congestion and rhinorrhea present.  Eyes:     General:        Right eye: No discharge.  Left eye: No discharge.     Conjunctiva/sclera: Conjunctivae normal.     Pupils: Pupils are equal, round, and reactive to light.  Neck:     Thyroid: No thyromegaly.     Vascular: No JVD.     Trachea: No tracheal deviation.  Cardiovascular:     Rate and Rhythm: Normal rate and regular rhythm.     Heart sounds: Normal heart sounds.  Pulmonary:     Effort: No respiratory distress.     Breath sounds: No stridor. No wheezing.  Abdominal:     General: Bowel sounds are normal. There is no distension.     Palpations: Abdomen is soft. There is no mass.     Tenderness: There is no abdominal tenderness. There is no guarding or rebound.  Musculoskeletal:        General: No tenderness.     Cervical back: Normal range of motion and neck supple. No rigidity.  Lymphadenopathy:      Cervical: No cervical adenopathy.  Skin:    Findings: No erythema or rash.  Neurological:     Cranial Nerves: No cranial nerve deficit.     Motor: No abnormal muscle tone.     Coordination: Coordination normal.     Deep Tendon Reflexes: Reflexes normal.  Psychiatric:        Behavior: Behavior normal.        Thought Content: Thought content normal.        Judgment: Judgment normal.     Lab Results  Component Value Date   WBC 7.8 09/29/2021   HGB 14.3 09/29/2021   HCT 43.0 09/29/2021   PLT 287.0 09/29/2021   GLUCOSE 94 09/29/2021   CHOL 220 (H) 09/29/2021   TRIG 178.0 (H) 09/29/2021   HDL 55.40 09/29/2021   LDLDIRECT 142.6 02/26/2013   LDLCALC 129 (H) 09/29/2021   ALT 12 09/29/2021   AST 19 09/29/2021   NA 143 09/29/2021   K 5.3 (H) 09/29/2021   CL 105 09/29/2021   CREATININE 0.83 09/29/2021   BUN 11 09/29/2021   CO2 24 09/29/2021   TSH 0.97 09/29/2021   HGBA1C 5.5 09/29/2021    MM 3D SCREEN BREAST BILATERAL  Result Date: 02/22/2022 CLINICAL DATA:  Screening. EXAM: DIGITAL SCREENING BILATERAL MAMMOGRAM WITH TOMOSYNTHESIS AND CAD TECHNIQUE: Bilateral screening digital craniocaudal and mediolateral oblique mammograms were obtained. Bilateral screening digital breast tomosynthesis was performed. The images were evaluated with computer-aided detection. COMPARISON:  Previous exam(s). ACR Breast Density Category c: The breast tissue is heterogeneously dense, which may obscure small masses. FINDINGS: There are no findings suspicious for malignancy. IMPRESSION: No mammographic evidence of malignancy. A result letter of this screening mammogram will be mailed directly to the patient. RECOMMENDATION: Screening mammogram in one year. (Code:SM-B-01Y) BI-RADS CATEGORY  1: Negative. Electronically Signed   By: Margarette Canada M.D.   On: 02/22/2022 13:52    Assessment & Plan:   Problem List Items Addressed This Visit     Acute sinusitis - Primary    Acute Start a Zpac Prom cough  syrup      Relevant Medications   azithromycin (ZITHROMAX Z-PAK) 250 MG tablet   promethazine-dextromethorphan (PROMETHAZINE-DM) 6.25-15 MG/5ML syrup      Meds ordered this encounter  Medications   azithromycin (ZITHROMAX Z-PAK) 250 MG tablet    Sig: As directed    Dispense:  6 tablet    Refill:  0   promethazine-dextromethorphan (PROMETHAZINE-DM) 6.25-15 MG/5ML syrup    Sig: Take 5 mLs by mouth  4 (four) times daily as needed for cough.    Dispense:  240 mL    Refill:  0      Follow-up: No follow-ups on file.  Walker Kehr, MD

## 2022-08-13 NOTE — Assessment & Plan Note (Signed)
Acute Start a Zpac Prom cough syrup

## 2022-08-17 ENCOUNTER — Ambulatory Visit: Payer: BC Managed Care – PPO | Admitting: Neurology

## 2022-08-17 DIAGNOSIS — R4182 Altered mental status, unspecified: Secondary | ICD-10-CM | POA: Diagnosis not present

## 2022-08-17 DIAGNOSIS — R404 Transient alteration of awareness: Secondary | ICD-10-CM

## 2022-08-24 NOTE — Procedures (Signed)
    History:  57 year old woman with history of meningioma surgery presenting with episodic dizziness and alteration of awareness  EEG classification: Awake and drowsy  Description of the recording: The background rhythms of this recording consists of a fairly well modulated medium amplitude alpha rhythm of 10 Hz that is reactive to eye opening and closure. Present in the anterior head region is a 15-20 Hz beta activity. Photic stimulation was performed, did not show any abnormalities. Hyperventilation was also performed, did not show any abnormalities. Drowsiness was manifested by background fragmentation. No abnormal epileptiform discharges seen during this recording. There was no focal slowing. There were no electrographic seizure identified. There was presence of breach rhythm in the right hemisphere  Abnormality: None   Impression: This is a normal EEG recorded while drowsy and awake. No evidence of interictal epileptiform discharges. Normal EEGs, however, do not rule out epilepsy.    Alric Ran, MD [facility]

## 2022-09-09 ENCOUNTER — Telehealth: Payer: Self-pay | Admitting: Internal Medicine

## 2022-09-09 NOTE — Telephone Encounter (Signed)
Caller & Relationship to patient: self    Call back [phone]   Date of last office visit:  10/2021   Date of next office visit:  not scheduled   Medication(s) to be refilled:   pantoprazole        Preferred Pharmacy:   [facility] [address]

## 2022-09-10 ENCOUNTER — Other Ambulatory Visit: Payer: Self-pay

## 2022-09-10 MED ORDER — PANTOPRAZOLE SODIUM 40 MG PO TBEC: 40.0000 mg | DELAYED_RELEASE_TABLET | Freq: Every day | ORAL | 1 refills | Status: DC

## 2022-09-10 NOTE — Telephone Encounter (Signed)
I was able to send in Erx.

## 2022-10-03 ENCOUNTER — Other Ambulatory Visit: Payer: Self-pay

## 2022-10-03 ENCOUNTER — Emergency Department (HOSPITAL_COMMUNITY): Payer: BC Managed Care – PPO

## 2022-10-03 ENCOUNTER — Encounter (HOSPITAL_COMMUNITY): Payer: Self-pay

## 2022-10-03 ENCOUNTER — Emergency Department (HOSPITAL_COMMUNITY)
Admission: EM | Admit: 2022-10-03 | Discharge: 2022-10-03 | Disposition: A | Discharge status: Home / Self Care | Payer: BC Managed Care – PPO | Attending: Emergency Medicine | Admitting: Emergency Medicine

## 2022-10-03 DIAGNOSIS — J45909 Unspecified asthma, uncomplicated: Secondary | ICD-10-CM | POA: Diagnosis not present

## 2022-10-03 DIAGNOSIS — M542 Cervicalgia: Secondary | ICD-10-CM | POA: Diagnosis present

## 2022-10-03 DIAGNOSIS — Z91040 Latex allergy status: Secondary | ICD-10-CM | POA: Insufficient documentation

## 2022-10-03 DIAGNOSIS — Y92410 Unspecified street and highway as the place of occurrence of the external cause: Secondary | ICD-10-CM | POA: Insufficient documentation

## 2022-10-03 MED ORDER — METHOCARBAMOL 500 MG PO TABS
750.0000 mg | ORAL_TABLET | Freq: Once | ORAL | Status: AC
  Administered 2022-10-03: 750 mg via ORAL
  Filled 2022-10-03: qty 2

## 2022-10-03 MED ORDER — METHOCARBAMOL 500 MG PO TABS: 500.0000 mg | ORAL_TABLET | Freq: Three times a day (TID) | ORAL | 0 refills | Status: AC | PRN

## 2022-10-03 MED ORDER — ACETAMINOPHEN 325 MG PO TABS
650.0000 mg | ORAL_TABLET | Freq: Once | ORAL | Status: AC
  Administered 2022-10-03: 650 mg via ORAL
  Filled 2022-10-03: qty 2

## 2022-10-03 NOTE — Discharge Instructions (Signed)
Take Tylenol for pain and soreness.  A prescription for muscle relaxer was sent to your pharmacy.  Take this as needed.  Return to the emergency department for any new or worsening symptoms of concern.
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
## 2022-10-03 NOTE — ED Notes (Signed)
Patient transported to CT 

## 2022-12-21 ENCOUNTER — Telehealth: Payer: Self-pay

## 2022-12-21 NOTE — Telephone Encounter (Signed)
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 Pt states she was told she needs a note from her PCP stating she is needing to have water at all times for health reasons.  Pt asked that letter be sent via MyChart so she can access it and send to her boss.

## 2022-12-22 ENCOUNTER — Encounter: Payer: Self-pay | Admitting: Internal Medicine

## 2022-12-23 NOTE — Telephone Encounter (Signed)
Ok for letter but overdue for physical needs to schedule.

## 2022-12-23 NOTE — Telephone Encounter (Signed)
Sent pt a my chart message letting her know that her letter will be ready and to give our office a call today or soon because she is overdue for a physical.

## 2023-01-07 ENCOUNTER — Encounter: Payer: Self-pay | Admitting: Internal Medicine

## 2023-01-07 ENCOUNTER — Ambulatory Visit (INDEPENDENT_AMBULATORY_CARE_PROVIDER_SITE_OTHER): Payer: BC Managed Care – PPO | Admitting: Internal Medicine

## 2023-01-07 VITALS — BP 120/86 | HR 87 | Temp 98.3°F | Ht 63.0 in | Wt 137.0 lb

## 2023-01-07 DIAGNOSIS — Z0000 Encounter for general adult medical examination without abnormal findings: Secondary | ICD-10-CM

## 2023-01-07 DIAGNOSIS — Z780 Asymptomatic menopausal state: Secondary | ICD-10-CM | POA: Insufficient documentation

## 2023-01-07 DIAGNOSIS — R42 Dizziness and giddiness: Secondary | ICD-10-CM

## 2023-01-07 DIAGNOSIS — D500 Iron deficiency anemia secondary to blood loss (chronic): Secondary | ICD-10-CM | POA: Diagnosis not present

## 2023-01-07 DIAGNOSIS — K219 Gastro-esophageal reflux disease without esophagitis: Secondary | ICD-10-CM

## 2023-01-07 LAB — LIPID PANEL
Cholesterol: 225 mg/dL — ABNORMAL HIGH (ref 0–200)
HDL: 48 mg/dL (ref >39.00)
LDL Cholesterol: 148 mg/dL — ABNORMAL HIGH (ref 0–99)
NonHDL: 177.48
Total CHOL/HDL Ratio: 5
Triglycerides: 146 mg/dL (ref 0.0–149.0)
VLDL: 29.2 mg/dL (ref 0.0–40.0)

## 2023-01-07 LAB — COMPREHENSIVE METABOLIC PANEL
ALT: 9 U/L (ref 0–35)
AST: 12 U/L (ref 0–37)
Albumin: 4.4 g/dL (ref 3.5–5.2)
Alkaline Phosphatase: 78 U/L (ref 39–117)
BUN: 12 mg/dL (ref 6–23)
CO2: 28 mEq/L (ref 19–32)
Calcium: 9.5 mg/dL (ref 8.4–10.5)
Chloride: 103 mEq/L (ref 96–112)
Creatinine, Ser: 0.63 mg/dL (ref 0.40–1.20)
GFR: 100.07 mL/min (ref >60.00)
Glucose, Bld: 77 mg/dL (ref 70–99)
Potassium: 4.1 mEq/L (ref 3.5–5.1)
Sodium: 140 mEq/L (ref 135–145)
Total Bilirubin: 0.4 mg/dL (ref 0.2–1.2)
Total Protein: 7 g/dL (ref 6.0–8.3)

## 2023-01-07 LAB — CBC
HCT: 43.8 % (ref 36.0–46.0)
Hemoglobin: 14.4 g/dL (ref 12.0–15.0)
MCHC: 32.9 g/dL (ref 30.0–36.0)
MCV: 86.2 fl (ref 78.0–100.0)
Platelets: 323 10*3/uL (ref 150.0–400.0)
RBC: 5.07 Mil/uL (ref 3.87–5.11)
RDW: 13.4 % (ref 11.5–15.5)
WBC: 8.5 10*3/uL (ref 4.0–10.5)

## 2023-01-07 NOTE — Assessment & Plan Note (Signed)
Refer to vestibular therapy to help assess and treat if this is related to vestibular symptoms. We discussed that her prior parietal mass resection (not recurrent CT 2024 no regrowth) should not cause dizziness.

## 2023-01-07 NOTE — Assessment & Plan Note (Signed)
Checking CBC and adjust as needed. Now in menopause may be resolved.

## 2023-01-07 NOTE — Progress Notes (Signed)
   Subjective:   Patient ID: Deborah Ortega, female    DOB: [birth year], 57 y.o.   MRN: [id number]  HPI The patient is here for physical. Having more menopause symptoms and still dizziness.   PMH, [facility], social history reviewed and updated  Review of Systems  Constitutional: Negative.   HENT: Negative.    Eyes: Negative.   Respiratory:  Negative for cough, chest tightness and shortness of breath.   Cardiovascular:  Negative for chest pain, palpitations and leg swelling.  Gastrointestinal:  Negative for abdominal distention, abdominal pain, constipation, diarrhea, nausea and vomiting.  Endocrine: Positive for heat intolerance.  Musculoskeletal:  Positive for arthralgias.  Skin: Negative.   Neurological:  Positive for dizziness.  Psychiatric/Behavioral: Negative.      Objective:  Physical Exam Constitutional:      Appearance: She is well-developed.  HENT:     Head: Normocephalic and atraumatic.  Cardiovascular:     Rate and Rhythm: Normal rate and regular rhythm.  Pulmonary:     Effort: Pulmonary effort is normal. No respiratory distress.     Breath sounds: Normal breath sounds. No wheezing or rales.  Abdominal:     General: Bowel sounds are normal. There is no distension.     Palpations: Abdomen is soft.     Tenderness: There is no abdominal tenderness. There is no rebound.  Musculoskeletal:     Cervical back: Normal range of motion.  Skin:    General: Skin is warm and dry.  Neurological:     Mental Status: She is alert and oriented to person, place, and time.     Coordination: Coordination normal.     Vitals:   01/07/23 1534  BP: 120/86  Pulse: 87  Temp: 98.3 F (36.8 C)  TempSrc: Oral  SpO2: 97%  Weight: 137 lb (62.1 kg)  Height: 5\' 3"  (1.6 m)    Assessment & Plan:

## 2023-01-07 NOTE — Assessment & Plan Note (Signed)
Having some hot flashes and mood changes and some joint discomfort. Taking lexapro 20 mg daily and buspar which have helped. Discussed hormone treatment and she wishes to wait on that for now. Discussed she would need estrogen/progesterone if she desires treatment.

## 2023-01-07 NOTE — Assessment & Plan Note (Signed)
Taking protonix 40 mg daily and well controlled.

## 2023-01-24 ENCOUNTER — Ambulatory Visit: Payer: BC Managed Care – PPO | Admitting: Physical Therapy

## 2023-01-26 ENCOUNTER — Other Ambulatory Visit: Payer: Self-pay | Admitting: Obstetrics and Gynecology

## 2023-01-26 DIAGNOSIS — Z1231 Encounter for screening mammogram for malignant neoplasm of breast: Secondary | ICD-10-CM

## 2023-01-28 ENCOUNTER — Encounter: Payer: Self-pay | Admitting: Physical Therapy

## 2023-01-28 ENCOUNTER — Ambulatory Visit: Payer: BC Managed Care – PPO | Admitting: Physical Therapy

## 2023-01-28 ENCOUNTER — Ambulatory Visit: Payer: BC Managed Care – PPO | Attending: Internal Medicine | Admitting: Physical Therapy

## 2023-01-28 DIAGNOSIS — R2681 Unsteadiness on feet: Secondary | ICD-10-CM | POA: Diagnosis present

## 2023-01-28 DIAGNOSIS — R42 Dizziness and giddiness: Secondary | ICD-10-CM | POA: Insufficient documentation

## 2023-01-28 NOTE — Therapy (Signed)
OUTPATIENT PHYSICAL THERAPY VESTIBULAR EVALUATION     Patient Name: Deborah Ortega MRN: [id number] DOB:1965/09/10, 57 y.o., female Today's Date: 01/28/2023  END OF SESSION:  PT End of Session - 01/28/23 1552     Visit Number 1    Number of Visits 9    Date for PT Re-Evaluation 02/27/23    Authorization Type BCBS    PT Start Time 1318    PT Stop Time 1400    PT Time Calculation (min) 42 min    Activity Tolerance Patient tolerated treatment well    Behavior During Therapy WFL for tasks assessed/performed             Past Medical History:  Diagnosis Date   ALLERGIC RHINITIS    Anemia    Anxiety    Asthma    being worked up for asthma now. Chronic cough   Depression    takes lexapro   GERD    History of kidney stones    HOH (hard of hearing)    both ears   MENINGIOMA    s/p resction 07/2009   MIGRAINE HEADACHE    PONV (postoperative nausea and vomiting)    RENAL CALCULUS, HX OF 06/25/2007, 02/2013   THYROID NODULE    Past Surgical History:  Procedure Laterality Date   ANKLE SURGERY Right 04/13/2018   BREAST BIOPSY Right 10/29/2013   BREAST LUMPECTOMY WITH RADIOACTIVE SEED LOCALIZATION Right 11/19/2013   Procedure: BREAST LUMPECTOMY WITH RADIOACTIVE SEED LOCALIZATION;  Surgeon: Ernestene Mention, MD;  Location: [facility];  Service: General;  Laterality: Right;   CESAREAN SECTION     x's 2   EXTRACORPOREAL SHOCK WAVE LITHOTRIPSY Left 12/17/2021   Procedure: LEFTEXTRACORPOREAL SHOCK WAVE LITHOTRIPSY (ESWL);  Surgeon: Jerilee Field, MD;  Location: [facility];  Service: Urology;  Laterality: Left;   Meninogioma Resection  07/25/09   pariatal area head-crainiotomy-plate   WISDOM TOOTH EXTRACTION     Patient Active Problem List   Diagnosis Date Noted   Menopause 01/07/2023   Abnormal cervical Papanicolaou smear 08/13/2022   Dysphonia 08/13/2022   Dizziness 10/02/2021   Sensorineural hearing loss (SNHL), bilateral 01/30/2019    Anemia 03/31/2017   History of brain tumor 03/31/2017   Chronic cough 09/15/2015   LPRD (laryngopharyngeal reflux disease) 09/15/2015   Routine general medical examination at a health care facility 12/15/2014   Intracranial tumor (HCC) 11/18/2014   Cough variant asthma 07/08/2014   Fibroadenoma of right breast 01/14/2014   THYROID NODULE 07/16/2009   Allergic rhinitis 07/15/2009   GERD 07/15/2009    PCP:  Myrlene Broker, MD REFERRING PROVIDER:  Myrlene Broker, MD  REFERRING DIAG: R42 (ICD-10-CM) - Dizziness   THERAPY DIAG:  Unsteadiness on feet  ONSET DATE: 01/07/2023  Rationale for Evaluation and Treatment: Rehabilitation  SUBJECTIVE:   SUBJECTIVE STATEMENT: Reports feeling tipsy all the time - does not drink. Does not feel steady on her feet. Feels like if she took a sobriety test then she would fail. This has been going on for over a year. Unsure where it came from. Was wondering if it was hormonal, but her ob/gyn did not think it was. Loves to roller skate and is petrified to [employer] - last time was in the fall. Denies spinning, but reports proprioception does not feel solid or grounded. Feels unsteady. No falls, but reports you could make her fall. Will stumble around or fall up against a wall. Denies feeling lightheaded or faint. Has not  been able to exercise, feels like she has lost a lot of muscle mass in her legs.Was worked up to see if the brain tumor returned, but that was negative.   Pt accompanied by: self  PERTINENT HISTORY: PMH: anemia, anxiety, asthma, depression, GERD, migraine, meningioma s/p resection 07/2009   PAIN:  Are you having pain? No  PRECAUTIONS: None  WEIGHT BEARING RESTRICTIONS: No  FALLS: Has patient fallen in last 6 months? No   PLOF: Independent, Vocation/Vocational requirements: Retired , and Leisure: Health visitor, yoga   PATIENT GOALS: Wants to get to the root of why she is feeling the way she is, not feel swimmy  headed.   OBJECTIVE:   DIAGNOSTIC FINDINGS: CT head 10/03/22  IMPRESSION: 1. No acute intracranial process. 2. 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 No evidence of recurrent mass or mass effect on this unenhanced exam  COGNITION: Overall cognitive status: Within functional limits for tasks assessed  GAIT: Gait pattern: {gait characteristics:25376} Distance walked: *** Assistive device utilized: {Assistive devices:23999} Level of assistance: {Levels of assistance:24026} Comments: ***   VESTIBULAR ASSESSMENT:  GENERAL OBSERVATION: Ambulates in with no AD.    SYMPTOM BEHAVIOR:  Subjective history: See above.   Non-Vestibular symptoms:  N/A  Type of dizziness: Imbalance (Disequilibrium)  Frequency: Daily  Duration: Constant, always there  Aggravating factors:  Closing eyes, turning head   Relieving factors: no known relieving factors  Progression of symptoms: unchanged  OCULOMOTOR EXAM:  Ocular Alignment: normal  Ocular ROM: No Limitations  Spontaneous Nystagmus: absent  Gaze-Induced Nystagmus: absent  Smooth Pursuits: intact  Saccades: intact   VESTIBULAR - OCULAR REFLEX:   Slow VOR: Normal  VOR Cancellation: Normal, a little weird feeling, but nothing crazy   Head-Impulse Test: HIT Right: negative HIT Left: positive Very slight, felt a little off afterwards   Dynamic Visual Acuity: Static: Line 10 Dynamic: Line 7 Feeling off afterwards, 3 line difference       M-CTSIB  Condition 1: Firm Surface, EO 30 Sec, Normal Sway  Condition 2: Firm Surface, EC 30 Sec, Mild Sway  Condition 3: Foam Surface, EO 30 Sec, Mild Sway  Condition 4: Foam Surface, EC 2.5 Sec      [facility] PT Assessment - 01/28/23 1355       Functional Gait  Assessment   Gait assessed  Yes    Gait Level Surface Walks 20 ft in less than 5.5 sec, no assistive devices, good speed, no evidence for imbalance, normal gait pattern,  deviates no more than 6 in outside of the 12 in walkway width.    Change in Gait Speed Able to smoothly change walking speed without loss of balance or gait deviation. Deviate no more than 6 in outside of the 12 in walkway width.    Gait with Horizontal Head Turns Performs head turns smoothly with slight change in gait velocity (eg, minor disruption to smooth gait path), deviates 6-10 in outside 12 in walkway width, or uses an assistive device.    Gait with Vertical Head Turns Performs task with slight change in gait velocity (eg, minor disruption to smooth gait path), deviates 6 - 10 in outside 12 in walkway width or uses assistive device    Gait and Pivot Turn Pivot turns safely within 3 sec and stops quickly with no loss of balance.    Step Over Obstacle Is able to step over 2 stacked shoe boxes taped together (9 in total height) without  changing gait speed. No evidence of imbalance.    Gait with Narrow Base of Support Ambulates 7-9 steps.    Gait with Eyes Closed Cannot walk 20 ft without assistance, severe gait deviations or imbalance, deviates greater than 15 in outside 12 in walkway width or will not attempt task.    Ambulating Backwards Walks 20 ft, uses assistive device, slower speed, mild gait deviations, deviates 6-10 in outside 12 in walkway width.    Steps Alternating feet, no rail.    Total Score 23    FGA comment: 23/30              VESTIBULAR TREATMENT:                                                                                                   N/A during eval   PATIENT EDUCATION: Education details: *** Person educated: {Person educated:25204} Education method: {Education Method:25205} Education comprehension: {Education Comprehension:25206}  HOME EXERCISE PROGRAM:  GOALS: Goals reviewed with patient? {yes/no:20286}  SHORT TERM GOALS: Target date: ***  *** Baseline: Goal status: {GOALSTATUS:25110}  2.  *** Baseline:  Goal status: {GOALSTATUS:25110}  3.   *** Baseline:  Goal status: {GOALSTATUS:25110}  4.  *** Baseline:  Goal status: {GOALSTATUS:25110}  5.  *** Baseline:  Goal status: {GOALSTATUS:25110}  6.  *** Baseline:  Goal status: {GOALSTATUS:25110}  LONG TERM GOALS: Target date: ***  *** Baseline:  Goal status: {GOALSTATUS:25110}  2.  *** Baseline:  Goal status: {GOALSTATUS:25110}  3.  *** Baseline:  Goal status: {GOALSTATUS:25110}  4.  *** Baseline:  Goal status: {GOALSTATUS:25110}  5.  *** Baseline:  Goal status: {GOALSTATUS:25110}  6.  *** Baseline:  Goal status: {GOALSTATUS:25110}  ASSESSMENT:  CLINICAL IMPRESSION: Patient is a *** y.o. *** who was seen today for physical therapy evaluation and treatment for ***.   OBJECTIVE IMPAIRMENTS: {opptimpairments:25111}.   ACTIVITY LIMITATIONS: {activitylimitations:27494}  PARTICIPATION LIMITATIONS: {participationrestrictions:25113}  PERSONAL FACTORS: {Personal factors:25162} are also affecting patient's functional outcome.   REHAB POTENTIAL: {rehabpotential:25112}  CLINICAL DECISION MAKING: {clinical decision making:25114}  EVALUATION COMPLEXITY: {Evaluation complexity:25115}   PLAN:  PT FREQUENCY: {rehab frequency:25116}  PT DURATION: {rehab duration:25117}  PLANNED INTERVENTIONS: {rehab planned interventions:25118::"Therapeutic exercises","Therapeutic activity","Neuromuscular re-education","Balance training","Gait training","Patient/Family education","Self Care","Joint mobilization"}  PLAN FOR NEXT SESSION: ***   Drake Leach, PT, DPT  01/28/2023, 3:53 PM

## 2023-02-08 ENCOUNTER — Ambulatory Visit: Payer: BC Managed Care – PPO | Admitting: Physical Therapy

## 2023-02-09 ENCOUNTER — Ambulatory Visit: Payer: BC Managed Care – PPO | Admitting: Physical Therapy

## 2023-02-09 ENCOUNTER — Encounter: Payer: Self-pay | Admitting: Physical Therapy

## 2023-02-09 DIAGNOSIS — R2681 Unsteadiness on feet: Secondary | ICD-10-CM

## 2023-02-09 NOTE — Therapy (Signed)
OUTPATIENT PHYSICAL THERAPY VESTIBULAR TREATMENT     Patient Name: Deborah Ortega MRN: [id number] DOB:[birth year], 57 y.o., female Today's Date: 02/09/2023  END OF SESSION:  PT End of Session - 02/09/23 0803     Visit Number 2    Number of Visits 9    Date for PT Re-Evaluation 02/27/23    Authorization Type BCBS    PT Start Time 0802    PT Stop Time 0843    PT Time Calculation (min) 41 min    Activity Tolerance Patient tolerated treatment well    Behavior During Therapy WFL for tasks assessed/performed             Past Medical History:  Diagnosis Date   ALLERGIC RHINITIS    Anemia    Anxiety    Asthma    being worked up for asthma now. Chronic cough   Depression    takes lexapro   GERD    History of kidney stones    HOH (hard of hearing)    both ears   MENINGIOMA    s/p resction 07/2009   MIGRAINE HEADACHE    PONV (postoperative nausea and vomiting)    RENAL CALCULUS, HX OF 06/25/2007, 02/2013   THYROID NODULE    Past Surgical History:  Procedure Laterality Date   ANKLE SURGERY Right 04/13/2018   BREAST BIOPSY Right 10/29/2013   BREAST LUMPECTOMY WITH RADIOACTIVE SEED LOCALIZATION Right 11/19/2013   Procedure: BREAST LUMPECTOMY WITH RADIOACTIVE SEED LOCALIZATION;  Surgeon: Ernestene Mention, MD;  Location: [facility];  Service: General;  Laterality: Right;   CESAREAN SECTION     x's 2   EXTRACORPOREAL SHOCK WAVE LITHOTRIPSY Left 12/17/2021   Procedure: LEFTEXTRACORPOREAL SHOCK WAVE LITHOTRIPSY (ESWL);  Surgeon: Jerilee Field, MD;  Location: [facility];  Service: Urology;  Laterality: Left;   Meninogioma Resection  07/25/09   pariatal area head-crainiotomy-plate   WISDOM TOOTH EXTRACTION     Patient Active Problem List   Diagnosis Date Noted   Menopause 01/07/2023   Abnormal cervical Papanicolaou smear 08/13/2022   Dysphonia 08/13/2022   Dizziness 10/02/2021   Sensorineural hearing loss (SNHL), bilateral 01/30/2019    Anemia 03/31/2017   History of brain tumor 03/31/2017   Chronic cough 09/15/2015   LPRD (laryngopharyngeal reflux disease) 09/15/2015   Routine general medical examination at a health care facility 12/15/2014   Intracranial tumor (HCC) 11/18/2014   Cough variant asthma 07/08/2014   Fibroadenoma of right breast 01/14/2014   THYROID NODULE 07/16/2009   Allergic rhinitis 07/15/2009   GERD 07/15/2009    PCP:  Myrlene Broker, MD REFERRING PROVIDER:  Myrlene Broker, MD  REFERRING DIAG: R42 (ICD-10-CM) - Dizziness   THERAPY DIAG:  Unsteadiness on feet  ONSET DATE: 01/07/2023  Rationale for Evaluation and Treatment: Rehabilitation  SUBJECTIVE:   SUBJECTIVE STATEMENT: Got back from her vacation last night. Had a really good time. Balance was tough on the [place] rocks.   Pt accompanied by: self  PERTINENT HISTORY: PMH: anemia, anxiety, asthma, depression, GERD, migraine, meningioma s/p resection 07/2009   PAIN:  Are you having pain? No  PRECAUTIONS: None  WEIGHT BEARING RESTRICTIONS: No  FALLS: Has patient fallen in last 6 months? No   PLOF: Independent, Vocation/Vocational requirements: Retired , and Leisure: Health visitor, yoga   PATIENT GOALS: Wants to get to the root of why she is feeling the way she is, not feel swimmy headed.   OBJECTIVE:   DIAGNOSTIC FINDINGS: CT  head 10/03/22  IMPRESSION: 1. No acute intracranial process. 2. 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 No evidence of recurrent mass or mass effect on this unenhanced exam    VESTIBULAR TREATMENT:                                                                                                    Gaze Adaptation: x1 Viewing Horizontal: Position: Seated, Time: 30, Reps: 2, and Comment: harder to keep in focus, cued to slow down to make sure X stayed in focused  x1 Viewing Vertical:  Position: Seated, Time: 30  seconds then 15-20 seconds , Reps: 3, and Comment: 8/10 dizziness during, pt more symptomatic with this one, easier to keep it in focus, pt with improvements in dizziness and rating as 5/10 when cued to perform more slowly and for only 15-20 seconds vs. 30 seconds    Access Code: [email or web address] URL: [email or web address] Date: 02/09/2023 Prepared by: Sherlie Ban  Initiated HEP, see MedBridge for details  Exercises - Wide Stance with Eyes Closed on Foam Pad  - 2 x daily - 5-7 x weekly - 3 sets - 30 hold - pt with incr postural sway, needing intermittent taps for balance  - Romberg Stance on Foam Pad with Head Rotation  - 2 x daily - 5-7 x weekly - 2 sets - 10 reps - EO with slight space between feet x10 reps head nods, x10 reps head turns. Pt more challenged with nods  - Tandem Walking with Counter Support  - 2 x daily - 5-7 x weekly - 3 sets  On rockerboard in A/P direction: Weight shifting EO x10 reps  Balloon taps with student volunteer x20 reps with PT providing min guard  Self ball toss x10 reps with no symptoms  Ball toss to R/L to Teacher, English as a foreign language and therapist x10 reps each side, mild symptoms   With feet apart EC 2 x 5 reps head turns, 2 x 5 reps head nods, pt improved with incr reps   PATIENT EDUCATION: Education details: Initial HEP, see MedBridge for more details, scheduling more appts  Person educated: Patient Education method: Explanation, Demonstration, and Handouts Education comprehension: verbalized understanding and returned demonstration  HOME EXERCISE PROGRAM: Seated VOR x30 seconds vertical and horizontal direction   Access Code: [email or web address] URL: [email or web address] Date: 02/09/2023 Prepared by: Sherlie Ban  Exercises - Wide Stance with Eyes Closed on Foam Pad  - 2 x daily - 5-7 x weekly - 3 sets - 30 hold - Romberg Stance on Foam Pad with Head Rotation  - 2 x daily - 5-7 x weekly - 2 sets - 10 reps - Tandem Walking with  Counter Support  - 2 x daily - 5-7 x weekly - 3 sets  GOALS: Goals reviewed with patient? Yes  SHORT TERM GOALS: ALL STGS = LTGS  LONG TERM GOALS: Target date: 02/28/2023  Pt will be independent with final HEP for vestibular related deficits in order to build upon functional gains made in therapy. Baseline:  Goal status: INITIAL  2.  Pt will  improve FGA to at least a 26/30 in order to demo decr fall risk. Baseline: 23/30 Goal status: INITIAL  3.   Pt will improve DVA to a 1 line difference or less in order to demo improved VOR  Baseline: 3 line difference  Goal status: INITIAL  4.  Pt will improve condition 4 of mCTSIB to at least 10 seconds in order to demo improved vestibular input for balance.  Baseline: 2.5 seconds  Goal status: INITIAL   ASSESSMENT:  CLINICAL IMPRESSION: Today's skilled session focused on initiating HEP for VOR and balance with narrow BOS/incr vestibular input with head motions and EC. Pt more symptomatic with VOR in the vertical direction, able to perform for 15-20 seconds at this time (otherwise 30 seconds incr pt symptoms to an 8/10). Pt also more challenged with balance and demonstrates more postural sway with head nods. With EC and head motions, pt did demo less postural sway with practice. Will continue per POC.   OBJECTIVE IMPAIRMENTS: Abnormal gait, decreased activity tolerance, decreased balance, difficulty walking, and dizziness.   ACTIVITY LIMITATIONS: locomotion level  PARTICIPATION LIMITATIONS: community activity, roller skating   PERSONAL FACTORS: Past/current experiences, Time since onset of injury/illness/exacerbation, and 3+ comorbidities: anemia, anxiety, asthma, depression, GERD, migraine, meningioma s/p resection 07/2009   are also affecting patient's functional outcome.   REHAB POTENTIAL: Good  CLINICAL DECISION MAKING: Stable/uncomplicated  EVALUATION COMPLEXITY: Low   PLAN:  PT FREQUENCY: 2x/week  PT DURATION: 4  weeks  PLANNED INTERVENTIONS: Therapeutic exercises, Therapeutic activity, Neuromuscular re-education, Balance training, Gait training, Patient/Family education, Self Care, Vestibular training, and Re-evaluation  PLAN FOR NEXT SESSION: progress VOR as able, work on eyes closed, head motions, tandems. High level balance for vestibular input    Drake Leach, PT, DPT  02/09/2023, 12:06 PM

## 2023-02-09 NOTE — Patient Instructions (Signed)
Gaze Stabilization: Sitting    Keeping eyes on target on wall a few feet away, tilt head down 15-30 and move head side to side for __30__ seconds. Repeat while moving head up and down for __15-20__ seconds. Do __2__ sessions per day.   Gaze Stabilization: Tip Card  1.Target must remain in focus, not blurry, and appear stationary while head is in motion. 2.Perform exercises with small head movements (45 to either side of midline). 3.Increase speed of head motion so long as target is in focus. 4.If you wear eyeglasses, be sure you can see target through lens (therapist will give specific instructions for bifocal / progressive lenses). 5.These exercises may provoke dizziness or nausea. Work through these symptoms. If too dizzy, slow head movement slightly. Rest between each exercise. 6.Exercises demand concentration; avoid distractions. 7.For safety, perform standing exercises close to a counter, wall, corner, or next to someone.    Copyright  VHI. All rights reserved.

## 2023-02-10 ENCOUNTER — Ambulatory Visit: Payer: BC Managed Care – PPO | Admitting: Physical Therapy

## 2023-02-10 ENCOUNTER — Encounter: Payer: Self-pay | Admitting: Physical Therapy

## 2023-02-10 VITALS — BP 115/83 | HR 72

## 2023-02-10 DIAGNOSIS — R2681 Unsteadiness on feet: Secondary | ICD-10-CM

## 2023-02-10 NOTE — Therapy (Signed)
OUTPATIENT PHYSICAL THERAPY VESTIBULAR TREATMENT     Patient Name: Deborah Ortega MRN: [id number] DOB:04/15/1966, 57 y.o., female Today's Date: 02/10/2023  END OF SESSION:  PT End of Session - 02/10/23 0807     Visit Number 3    Number of Visits 9    Date for PT Re-Evaluation 02/27/23    Authorization Type BCBS    PT Start Time 0806    PT Stop Time 0853    PT Time Calculation (min) 47 min    Equipment Utilized During Treatment Gait belt    Activity Tolerance Patient tolerated treatment well    Behavior During Therapy WFL for tasks assessed/performed             Past Medical History:  Diagnosis Date   ALLERGIC RHINITIS    Anemia    Anxiety    Asthma    being worked up for asthma now. Chronic cough   Depression    takes lexapro   GERD    History of kidney stones    HOH (hard of hearing)    both ears   MENINGIOMA    s/p resction 07/2009   MIGRAINE HEADACHE    PONV (postoperative nausea and vomiting)    RENAL CALCULUS, HX OF 06/25/2007, 02/2013   THYROID NODULE    Past Surgical History:  Procedure Laterality Date   ANKLE SURGERY Right 04/13/2018   BREAST BIOPSY Right 10/29/2013   BREAST LUMPECTOMY WITH RADIOACTIVE SEED LOCALIZATION Right 11/19/2013   Procedure: BREAST LUMPECTOMY WITH RADIOACTIVE SEED LOCALIZATION;  Surgeon: Ernestene Mention, MD;  Location: [facility];  Service: General;  Laterality: Right;   CESAREAN SECTION     x's 2   EXTRACORPOREAL SHOCK WAVE LITHOTRIPSY Left 12/17/2021   Procedure: LEFTEXTRACORPOREAL SHOCK WAVE LITHOTRIPSY (ESWL);  Surgeon: Jerilee Field, MD;  Location: [facility];  Service: Urology;  Laterality: Left;   Meninogioma Resection  07/25/09   pariatal area head-crainiotomy-plate   WISDOM TOOTH EXTRACTION     Patient Active Problem List   Diagnosis Date Noted   Menopause 01/07/2023   Abnormal cervical Papanicolaou smear 08/13/2022   Dysphonia 08/13/2022   Dizziness 10/02/2021   Sensorineural  hearing loss (SNHL), bilateral 01/30/2019   Anemia 03/31/2017   History of brain tumor 03/31/2017   Chronic cough 09/15/2015   LPRD (laryngopharyngeal reflux disease) 09/15/2015   Routine general medical examination at a health care facility 12/15/2014   Intracranial tumor (HCC) 11/18/2014   Cough variant asthma 07/08/2014   Fibroadenoma of right breast 01/14/2014   THYROID NODULE 07/16/2009   Allergic rhinitis 07/15/2009   GERD 07/15/2009    PCP:  Myrlene Broker, MD REFERRING PROVIDER:  Myrlene Broker, MD  REFERRING DIAG: R42 (ICD-10-CM) - Dizziness   THERAPY DIAG:  Unsteadiness on feet  ONSET DATE: 01/07/2023  Rationale for Evaluation and Treatment: Rehabilitation  SUBJECTIVE:   SUBJECTIVE STATEMENT: Patient reports that she could use a new printout of her exercises. She feels like her symptoms are about 3-4/10 currently. She did not feel worse than normal after last time. She denies any falls but states she continues to have some "near falls" and "stumbles."   Pt accompanied by: self  PERTINENT HISTORY: PMH: anemia, anxiety, asthma, depression, GERD, migraine, meningioma s/p resection 07/2009   PAIN:  Are you having pain? No  PRECAUTIONS: None  WEIGHT BEARING RESTRICTIONS: No  FALLS: Has patient fallen in last 6 months? No   PLOF: Independent, Vocation/Vocational requirements: Retired , and  Leisure: likes roller skating, yoga   PATIENT GOALS: Wants to get to the root of why she is feeling the way she is, not feel swimmy headed.   OBJECTIVE:   DIAGNOSTIC FINDINGS: CT head 10/03/22  IMPRESSION: 1. No acute intracranial process. 2. 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 No evidence of recurrent mass or mass effect on this unenhanced exam    VESTIBULAR TREATMENT:                                                                                                    Vitals:    02/10/23 0811  BP: 115/83  Pulse: 72     Gaze Adaptation: x1 Viewing Horizontal: Position: Seated, Time: 30, Reps: 2, and Comment: reports mild dizziness; requires cueing to not stop in the middle, eyes intermittently jump off target  x1 Viewing Vertical:  Position: Seated, Time:  25 seconds, 25 seconds, 28 secons Reps: 3, and Comment: reports symptoms to 5/10, eyes intermittently jump of target with initial few attempts  Overground habituation: Hallway walking slow with head turns to sticky notes 6 x 15 feet reports reduction in symptoms with reps overall (SBA-CGA) Forward ambulation with clockwise/counterclockwise head turns with ball 2 x 30 feet each (SBA) Reports greater difficulty with clockwise  Corner balance EC NBOS on foam 2 x 5-10" (minA) EC WBOS on foam 4 x 15-30" (SBA, CGA with LOB) EC NBOS on firm 3 x 30" (reports increased ease with repetition)  PATIENT EDUCATION: Education details: Reprinted HEP + progress towards goal Person educated: Patient Education method: Explanation, Demonstration, and Handouts Education comprehension: verbalized understanding and returned demonstration  HOME EXERCISE PROGRAM:   GOALS: Goals reviewed with patient? Yes  SHORT TERM GOALS: ALL STGS = LTGS  LONG TERM GOALS: Target date: 02/28/2023  Pt will be independent with final HEP for vestibular related deficits in order to build upon functional gains made in therapy. Baseline:  Goal status: INITIAL  2.  Pt will improve FGA to at least a 26/30 in order to demo decr fall risk. Baseline: 23/30 Goal status: INITIAL  3.   Pt will improve DVA to a 1 line difference or less in order to demo improved VOR  Baseline: 3 line difference  Goal status: INITIAL  4.  Pt will improve condition 4 of mCTSIB to at least 10 seconds in order to demo improved vestibular input for balance.  Baseline: 2.5 seconds  Goal status: INITIAL   ASSESSMENT:  CLINICAL IMPRESSION: Session focused on  continued adaptation/habituation. Patient demonstrated increased tolerance to VOR x 1 viewing with vertical head turns to 29-25 seconds in today's session. Demonstrates need for minA intermittently with EC on foam with NBOS so regressed to [facility] at this time. Will continue per POC.   OBJECTIVE IMPAIRMENTS: Abnormal gait, decreased activity tolerance, decreased balance, difficulty walking, and dizziness.   ACTIVITY LIMITATIONS: locomotion level  PARTICIPATION LIMITATIONS: community activity, roller skating   PERSONAL FACTORS: Past/current experiences, Time since onset of injury/illness/exacerbation, and 3+ comorbidities: anemia, anxiety, asthma, depression, GERD, migraine, meningioma s/p resection  07/2009   are also affecting patient's functional outcome.   REHAB POTENTIAL: Good  CLINICAL DECISION MAKING: Stable/uncomplicated  EVALUATION COMPLEXITY: Low   PLAN:  PT FREQUENCY: 2x/week  PT DURATION: 4 weeks  PLANNED INTERVENTIONS: Therapeutic exercises, Therapeutic activity, Neuromuscular re-education, Balance training, Gait training, Patient/Family education, Self Care, Vestibular training, and Re-evaluation  PLAN FOR NEXT SESSION: progress VOR as able, work on eyes closed, head motions, tandems. High level balance for vestibular input, item pickups with balance tasks on compliant surfaces    Carmelia Bake, PT, DPT  02/10/2023, 9:19 AM

## 2023-02-11 ENCOUNTER — Ambulatory Visit: Payer: BC Managed Care – PPO | Admitting: Physical Therapy

## 2023-02-22 ENCOUNTER — Ambulatory Visit: Payer: BC Managed Care – PPO | Attending: Internal Medicine | Admitting: Physical Therapy

## 2023-02-22 ENCOUNTER — Encounter: Payer: Self-pay | Admitting: Physical Therapy

## 2023-02-22 VITALS — BP 109/67 | HR 76

## 2023-02-22 DIAGNOSIS — R2681 Unsteadiness on feet: Secondary | ICD-10-CM | POA: Insufficient documentation

## 2023-02-22 NOTE — Therapy (Signed)
OUTPATIENT PHYSICAL THERAPY VESTIBULAR TREATMENT     Patient Name: Deborah Ortega MRN: [id number] DOB:01/11/1966, 57 y.o., female Today's Date: 02/22/2023  END OF SESSION:  PT End of Session - 02/22/23 1237     Visit Number 4    Number of Visits 9    Date for PT Re-Evaluation 02/27/23    Authorization Type BCBS    PT Start Time 1236    PT Stop Time 1316    PT Time Calculation (min) 40 min    Equipment Utilized During Treatment Gait belt    Activity Tolerance Patient tolerated treatment well    Behavior During Therapy WFL for tasks assessed/performed             Past Medical History:  Diagnosis Date   ALLERGIC RHINITIS    Anemia    Anxiety    Asthma    being worked up for asthma now. Chronic cough   Depression    takes lexapro   GERD    History of kidney stones    HOH (hard of hearing)    both ears   MENINGIOMA    s/p resction 07/2009   MIGRAINE HEADACHE    PONV (postoperative nausea and vomiting)    RENAL CALCULUS, HX OF 06/25/2007, 02/2013   THYROID NODULE    Past Surgical History:  Procedure Laterality Date   ANKLE SURGERY Right 04/13/2018   BREAST BIOPSY Right 10/29/2013   BREAST LUMPECTOMY WITH RADIOACTIVE SEED LOCALIZATION Right 11/19/2013   Procedure: BREAST LUMPECTOMY WITH RADIOACTIVE SEED LOCALIZATION;  Surgeon: Ernestene Mention, MD;  Location: [facility];  Service: General;  Laterality: Right;   CESAREAN SECTION     x's 2   EXTRACORPOREAL SHOCK WAVE LITHOTRIPSY Left 12/17/2021   Procedure: LEFTEXTRACORPOREAL SHOCK WAVE LITHOTRIPSY (ESWL);  Surgeon: Jerilee Field, MD;  Location[facility];  Service: Urology;  Laterality: Left;   Meninogioma Resection  07/25/09   pariatal area head-crainiotomy-plate   WISDOM TOOTH EXTRACTION     Patient Active Problem List   Diagnosis Date Noted   Menopause 01/07/2023   Abnormal cervical Papanicolaou smear 08/13/2022   Dysphonia 08/13/2022   Dizziness 10/02/2021   Sensorineural  hearing loss (SNHL), bilateral 01/30/2019   Anemia 03/31/2017   History of brain tumor 03/31/2017   Chronic cough 09/15/2015   LPRD (laryngopharyngeal reflux disease) 09/15/2015   Routine general medical examination at a health care facility 12/15/2014   Intracranial tumor (HCC) 11/18/2014   Cough variant asthma 07/08/2014   Fibroadenoma of right breast 01/14/2014   THYROID NODULE 07/16/2009   Allergic rhinitis 07/15/2009   GERD 07/15/2009    PCP:  Myrlene Broker, MD REFERRING PROVIDER:  Myrlene Broker, MD  REFERRING DIAG: R42 (ICD-10-CM) - Dizziness   THERAPY DIAG:  Unsteadiness on feet  ONSET DATE: 01/07/2023  Rationale for Evaluation and Treatment: Rehabilitation  SUBJECTIVE:   SUBJECTIVE STATEMENT: Patient reports that she was able to stand with her eyes closed on the bathroom sink and felt like she didn't need to rock. Patient otherwise feels about the same. Denies falls/near falls. Reports baseline symptoms as 5/10.   Pt accompanied by: self  PERTINENT HISTORY: PMH: anemia, anxiety, asthma, depression, GERD, migraine, meningioma s/p resection 07/2009   PAIN:  Are you having pain? No  PRECAUTIONS: None  WEIGHT BEARING RESTRICTIONS: No  FALLS: Has patient fallen in last 6 months? No   PLOF: Independent, Vocation/Vocational requirements: Retired , and Leisure: Health visitor, yoga   PATIENT  GOALS: Wants to get to the root of why she is feeling the way she is, not feel swimmy headed.   OBJECTIVE:   DIAGNOSTIC FINDINGS: CT head 10/03/22  IMPRESSION: 1. No acute intracranial process. 2. 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 No evidence of recurrent mass or mass effect on this unenhanced exam    VESTIBULAR TREATMENT:                                                                                                    Vitals:   02/22/23 1241  BP: 109/67  Pulse: 76      Gaze Adaptation Plain Background: x1 Viewing Horizontal: Position: Standing, Time: 45, Reps: 2, and Comment: reports mild dizziness, mild increase in dizziness on second set x1 Viewing Vertical:  Position: Standing, Time:  30 seconds, 40 seconds Reps: 2, and Comment: reports first set feels more similar to second set of horizontal, felt similar on the second set  Trampoline habituation 3 X 20" each:  Small bounces with feet planted with gaze fixation on X with UE support (SBA) Weight shifts side to side with gaze fixation on X with UE support (SBA) EC WBOS with min-CGA  Reports mild increase in ease on the second/third round  Rocker Board balance: 3 x 10 rocker board EO rocks to BOS forward/back  3 x 20 sec EC on rocker board maintaining balance   EC Between // Bars: NBOS 1-2 seconds for x 5 trials with CGA-minA WBOS 1 x 30" min-mod sway Shoulder width apart BOS 1 x 30" mod sway  PATIENT EDUCATION: Education details: Reprinted HEP + progress towards goal Person educated: Patient Education method: Explanation, Demonstration, and Handouts Education comprehension: verbalized understanding and returned demonstration  HOME EXERCISE PROGRAM:  Access Code: [email or web address] URL: [email or web address] Date: 02/09/2023 Prepared by: Sherlie Ban   Initiated HEP, see MedBridge for details   Exercises - Wide Stance with Eyes Closed on Foam Pad  - 2 x daily - 5-7 x weekly - 3 sets - 30 hold - pt with incr postural sway, needing intermittent taps for balance  - Romberg Stance on Foam Pad with Head Rotation  - 2 x daily - 5-7 x weekly - 2 sets - 10 reps - EO with slight space between feet x10 reps head nods, x10 reps head turns. Pt more challenged with nods  - Tandem Walking with Counter Support  - 2 x daily - 5-7 x weekly - 3 sets - VOR x 1 viewing goal of 6x a day as tolerated duration ~30 seconds  GOALS: Goals reviewed with patient? Yes  SHORT TERM GOALS: ALL STGS =  LTGS  LONG TERM GOALS: Target date: 02/28/2023  Pt will be independent with final HEP for vestibular related deficits in order to build upon functional gains made in therapy. Baseline:  Goal status: INITIAL  2.  Pt will improve FGA to at least a 26/30 in order to demo decr fall risk. Baseline: 23/30 Goal status: INITIAL  3.   Pt will improve DVA to a 1 line  difference or less in order to demo improved VOR  Baseline: 3 line difference  Goal status: INITIAL  4.  Pt will improve condition 4 of mCTSIB to at least 10 seconds in order to demo improved vestibular input for balance.  Baseline: 2.5 seconds  Goal status: INITIAL   ASSESSMENT:  CLINICAL IMPRESSION: Session continued to work on adaptation/habituation with emphasis on activities with EC on compliant surface in addition to continued progression of gaze stabilization tasks. Patient able to tolerate up to 40 seconds vertically and 45 seconds horizontally in today's session. Continues to lose balance anterior/posterior with EC on compliant surface but increased ease noted with wider BOS. Will continue per POC.   OBJECTIVE IMPAIRMENTS: Abnormal gait, decreased activity tolerance, decreased balance, difficulty walking, and dizziness.   ACTIVITY LIMITATIONS: locomotion level  PARTICIPATION LIMITATIONS: community activity, roller skating   PERSONAL FACTORS: Past/current experiences, Time since onset of injury/illness/exacerbation, and 3+ comorbidities: anemia, anxiety, asthma, depression, GERD, migraine, meningioma s/p resection 07/2009   are also affecting patient's functional outcome.   REHAB POTENTIAL: Good  CLINICAL DECISION MAKING: Stable/uncomplicated  EVALUATION COMPLEXITY: Low   PLAN:  PT FREQUENCY: 2x/week  PT DURATION: 4 weeks  PLANNED INTERVENTIONS: Therapeutic exercises, Therapeutic activity, Neuromuscular re-education, Balance training, Gait training, Patient/Family education, Self Care, Vestibular training,  and Re-evaluation  PLAN FOR NEXT SESSION: progress VOR as able, work on eyes closed, head motions, tandems. High level balance for vestibular input, item pickups with balance tasks on compliant surfaces, work on dynamic on compliant surfaces with head turns, RECERT + assess goals on 7/15  Carmelia Bake, PT, DPT  02/22/2023, 1:51 PM

## 2023-02-24 ENCOUNTER — Ambulatory Visit: Payer: BC Managed Care – PPO

## 2023-02-24 DIAGNOSIS — Z1231 Encounter for screening mammogram for malignant neoplasm of breast: Secondary | ICD-10-CM

## 2023-02-25 ENCOUNTER — Ambulatory Visit: Payer: BC Managed Care – PPO | Admitting: Physical Therapy

## 2023-02-25 ENCOUNTER — Encounter: Payer: Self-pay | Admitting: Physical Therapy

## 2023-02-25 DIAGNOSIS — R2681 Unsteadiness on feet: Secondary | ICD-10-CM | POA: Diagnosis not present

## 2023-02-25 NOTE — Therapy (Signed)
OUTPATIENT PHYSICAL THERAPY VESTIBULAR TREATMENT     Patient Name: Deborah Ortega MRN: [id number] DOB:March 14, 1966, 57 y.o., female Today's Date: 02/25/2023  END OF SESSION:  PT End of Session - 02/25/23 0849     Visit Number 5    Number of Visits 9    Date for PT Re-Evaluation 02/27/23    Authorization Type BCBS    PT Start Time 0848    PT Stop Time 0929    PT Time Calculation (min) 41 min    Equipment Utilized During Treatment Gait belt    Activity Tolerance Patient tolerated treatment well    Behavior During Therapy WFL for tasks assessed/performed             Past Medical History:  Diagnosis Date   ALLERGIC RHINITIS    Anemia    Anxiety    Asthma    being worked up for asthma now. Chronic cough   Depression    takes lexapro   GERD    History of kidney stones    HOH (hard of hearing)    both ears   MENINGIOMA    s/p resction 07/2009   MIGRAINE HEADACHE    PONV (postoperative nausea and vomiting)    RENAL CALCULUS, HX OF 06/25/2007, 02/2013   THYROID NODULE    Past Surgical History:  Procedure Laterality Date   ANKLE SURGERY Right 04/13/2018   BREAST BIOPSY Right 10/29/2013   BREAST LUMPECTOMY WITH RADIOACTIVE SEED LOCALIZATION Right 11/19/2013   Procedure: BREAST LUMPECTOMY WITH RADIOACTIVE SEED LOCALIZATION;  Surgeon: Ernestene Mention, MD;  Location: [facility];  Service: General;  Laterality: Right;   CESAREAN SECTION     x's 2   EXTRACORPOREAL SHOCK WAVE LITHOTRIPSY Left 12/17/2021   Procedure: LEFTEXTRACORPOREAL SHOCK WAVE LITHOTRIPSY (ESWL);  Surgeon: Jerilee Field, MD;  Location: [facility];  Service: Urology;  Laterality: Left;   Meninogioma Resection  07/25/09   pariatal area head-crainiotomy-plate   WISDOM TOOTH EXTRACTION     Patient Active Problem List   Diagnosis Date Noted   Menopause 01/07/2023   Abnormal cervical Papanicolaou smear 08/13/2022   Dysphonia 08/13/2022   Dizziness 10/02/2021   Sensorineural  hearing loss (SNHL), bilateral 01/30/2019   Anemia 03/31/2017   History of brain tumor 03/31/2017   Chronic cough 09/15/2015   LPRD (laryngopharyngeal reflux disease) 09/15/2015   Routine general medical examination at a health care facility 12/15/2014   Intracranial tumor (HCC) 11/18/2014   Cough variant asthma 07/08/2014   Fibroadenoma of right breast 01/14/2014   THYROID NODULE 07/16/2009   Allergic rhinitis 07/15/2009   GERD 07/15/2009    PCP:  Myrlene Broker, MD REFERRING PROVIDER:  Myrlene Broker, MD  REFERRING DIAG: R42 (ICD-10-CM) - Dizziness   THERAPY DIAG:  Unsteadiness on feet  ONSET DATE: 01/07/2023  Rationale for Evaluation and Treatment: Rehabilitation  SUBJECTIVE:   SUBJECTIVE STATEMENT: Feeling more balanced when closing her eyes to brush her teeth. Felt a little dizzy when looking up from her phone in the waiting room. Reports current symptoms as 5/10. Working on getting her feet closer together with EC   Pt accompanied by: self  PERTINENT HISTORY: PMH: anemia, anxiety, asthma, depression, GERD, migraine, meningioma s/p resection 07/2009   PAIN:  Are you having pain? No  PRECAUTIONS: None  WEIGHT BEARING RESTRICTIONS: No  FALLS: Has patient fallen in last 6 months? No   PLOF: Independent, Vocation/Vocational requirements: Retired , and Leisure: likes Surveyor, minerals, yoga  PATIENT GOALS: Wants to get to the root of why she is feeling the way she is, not feel swimmy headed.   OBJECTIVE:   DIAGNOSTIC FINDINGS: CT head 10/03/22  IMPRESSION: 1. No acute intracranial process. 2. 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 No evidence of recurrent mass or mass effect on this unenhanced exam    VESTIBULAR TREATMENT:                                                                                                    There were no vitals filed for this visit.    Gaze  Adaptation Plain Background: x1 Viewing Horizontal: Position: Standing, Time: 45 seconds and 60 seconds, Reps: 2, and Comment:No symptoms with 45 seconds, pt reporting mild symptoms for 60 seconds  x1 Viewing Vertical:  Position: Standing, Time:  45 seconds Reps: 2, and Comment: reports feeling similar to horizontal for 60 seconds, mild dizziness   On air ex:  Feet apart > slightly closer together holding ball and making diagonals for gaze stabilization/visual tracking, 2 x 10 reps each side, pt with more unsteadiness with feet closer together and needing to tap wall intermittently for balance   On blue compliant mat:  Forward gait with EC x6 reps, with intermittent taps to countertop for balance, pt unsteady with this  Static marching for SLS, then adding in head turns x10 reps each side Dynamic marching and adding in head turns, down and back x3 reps, pt more challenged with this    Gait 115' with head turns and nods, pt more unsteady with head turns  Gait down hallway with head turns x3 reps, pt improving with incr reps  Tandem stance on level ground 2 x 30 seconds bilat with intermittent taps to walls   PATIENT EDUCATION: Education details: Gait with head turns to HEP, pt asking about returning to yoga - discussed starting with some YouTube chair yoga classes and gentle classes where she has a chair nearby first and see how she does with this before returning to an in person gentle yoga class  Person educated: Patient Education method: Explanation and Demonstration Education comprehension: verbalized understanding and returned demonstration  HOME EXERCISE PROGRAM: Gait with head turns to HEP   Access Code: [email or web address] URL: [email or web address] Date: 02/09/2023 Prepared by: Sherlie Ban   Initiated HEP, see MedBridge for details   Exercises - Wide Stance with Eyes Closed on Foam Pad  - 2 x daily - 5-7 x weekly - 3 sets - 30 hold - pt with incr postural sway,  needing intermittent taps for balance  - Romberg Stance on Foam Pad with Head Rotation  - 2 x daily - 5-7 x weekly - 2 sets - 10 reps - EO with slight space between feet x10 reps head nods, x10 reps head turns. Pt more challenged with nods  - Tandem Walking with Counter Support  - 2 x daily - 5-7 x weekly - 3 sets - VOR x 1 viewing goal of 6x a day as tolerated duration ~30 seconds  GOALS: Goals  reviewed with patient? Yes  SHORT TERM GOALS: ALL STGS = LTGS  LONG TERM GOALS: Target date: 02/28/2023  Pt will be independent with final HEP for vestibular related deficits in order to build upon functional gains made in therapy. Baseline:  Goal status: INITIAL  2.  Pt will improve FGA to at least a 26/30 in order to demo decr fall risk. Baseline: 23/30 Goal status: INITIAL  3.   Pt will improve DVA to a 1 line difference or less in order to demo improved VOR  Baseline: 3 line difference  Goal status: INITIAL  4.  Pt will improve condition 4 of mCTSIB to at least 10 seconds in order to demo improved vestibular input for balance.  Baseline: 2.5 seconds  Goal status: INITIAL   ASSESSMENT:  CLINICAL IMPRESSION: Today's skilled session continued to focus on EC tasks, gaze stabilization, narrow BOS, and head motion tasks. Able to progress standing horizontal VOR to 60 seconds with minimal symptoms. Pt able to tolerate up to 45 seconds in there vertical direction today. With gait with head motions, pt more challenged and off balance with head turns. Added to pt's HEP for dynamic gait. Will continue per POC.   OBJECTIVE IMPAIRMENTS: Abnormal gait, decreased activity tolerance, decreased balance, difficulty walking, and dizziness.   ACTIVITY LIMITATIONS: locomotion level  PARTICIPATION LIMITATIONS: community activity, roller skating   PERSONAL FACTORS: Past/current experiences, Time since onset of injury/illness/exacerbation, and 3+ comorbidities: anemia, anxiety, asthma, depression, GERD,  migraine, meningioma s/p resection 07/2009   are also affecting patient's functional outcome.   REHAB POTENTIAL: Good  CLINICAL DECISION MAKING: Stable/uncomplicated  EVALUATION COMPLEXITY: Low   PLAN:  PT FREQUENCY: 2x/week  PT DURATION: 4 weeks  PLANNED INTERVENTIONS: Therapeutic exercises, Therapeutic activity, Neuromuscular re-education, Balance training, Gait training, Patient/Family education, Self Care, Vestibular training, and Re-evaluation  PLAN FOR NEXT SESSION:  RECERT + assess goals on 7/15, add more appts   progress VOR as able, work on eyes closed, head motions, tandems. High level balance for vestibular input, item pickups with balance tasks on compliant surfaces, work on dynamic on compliant surfaces with head turns  Drake Leach, PT, DPT  02/25/2023, 9:48 AM

## 2023-02-28 ENCOUNTER — Encounter: Payer: Self-pay | Admitting: Physical Therapy

## 2023-02-28 ENCOUNTER — Ambulatory Visit: Payer: BC Managed Care – PPO | Admitting: Physical Therapy

## 2023-02-28 VITALS — BP 141/92 | HR 78

## 2023-02-28 DIAGNOSIS — R2681 Unsteadiness on feet: Secondary | ICD-10-CM

## 2023-02-28 NOTE — Therapy (Signed)
OUTPATIENT PHYSICAL THERAPY VESTIBULAR TREATMENT / RECERT     Patient Name: Deborah Ortega MRN: [id number] DOB:09-29-1965, 57 y.o., female Today's Date: 02/28/2023  END OF SESSION:  PT End of Session - 02/28/23 1234     Visit Number 6    Number of Visits 14    Date for PT Re-Evaluation 04/11/23    Authorization Type BCBS    PT Start Time 1233    PT Stop Time 1315    PT Time Calculation (min) 42 min    Equipment Utilized During Treatment Gait belt    Activity Tolerance Patient tolerated treatment well    Behavior During Therapy WFL for tasks assessed/performed             Past Medical History:  Diagnosis Date   ALLERGIC RHINITIS    Anemia    Anxiety    Asthma    being worked up for asthma now. Chronic cough   Depression    takes lexapro   GERD    History of kidney stones    HOH (hard of hearing)    both ears   MENINGIOMA    s/p resction 07/2009   MIGRAINE HEADACHE    PONV (postoperative nausea and vomiting)    RENAL CALCULUS, HX OF 06/25/2007, 02/2013   THYROID NODULE    Past Surgical History:  Procedure Laterality Date   ANKLE SURGERY Right 04/13/2018   BREAST BIOPSY Right 10/29/2013   BREAST LUMPECTOMY WITH RADIOACTIVE SEED LOCALIZATION Right 11/19/2013   Procedure: BREAST LUMPECTOMY WITH RADIOACTIVE SEED LOCALIZATION;  Surgeon: Ernestene Mention, MD;  Location: [facility];  Service: General;  Laterality: Right;   CESAREAN SECTION     x's 2   EXTRACORPOREAL SHOCK WAVE LITHOTRIPSY Left 12/17/2021   Procedure: LEFTEXTRACORPOREAL SHOCK WAVE LITHOTRIPSY (ESWL);  Surgeon: Jerilee Field, MD;  Location: [facility];  Service: Urology;  Laterality: Left;   Meninogioma Resection  07/25/09   pariatal area head-crainiotomy-plate   WISDOM TOOTH EXTRACTION     Patient Active Problem List   Diagnosis Date Noted   Menopause 01/07/2023   Abnormal cervical Papanicolaou smear 08/13/2022   Dysphonia 08/13/2022   Dizziness 10/02/2021    Sensorineural hearing loss (SNHL), bilateral 01/30/2019   Anemia 03/31/2017   History of brain tumor 03/31/2017   Chronic cough 09/15/2015   LPRD (laryngopharyngeal reflux disease) 09/15/2015   Routine general medical examination at a health care facility 12/15/2014   Intracranial tumor (HCC) 11/18/2014   Cough variant asthma 07/08/2014   Fibroadenoma of right breast 01/14/2014   THYROID NODULE 07/16/2009   Allergic rhinitis 07/15/2009   GERD 07/15/2009    PCP:  Myrlene Broker, MD REFERRING PROVIDER:  Myrlene Broker, MD  REFERRING DIAG: R42 (ICD-10-CM) - Dizziness   THERAPY DIAG:  Unsteadiness on feet  ONSET DATE: 01/07/2023  Rationale for Evaluation and Treatment: Rehabilitation  SUBJECTIVE:   SUBJECTIVE STATEMENT: Reports HEP is continuing to go well. Reports symptoms as 7/10. States she has been looking at phone which can aggravate her symptoms. Denies falls/near falls.  Pt accompanied by: self  PERTINENT HISTORY: PMH: anemia, anxiety, asthma, depression, GERD, migraine, meningioma s/p resection 07/2009   PAIN:  Are you having pain? No  PRECAUTIONS: None  WEIGHT BEARING RESTRICTIONS: No  FALLS: Has patient fallen in last 6 months? No   PLOF: Independent, Vocation/Vocational requirements: Retired , and Leisure: Health visitor, yoga   PATIENT GOALS: Wants to get to the root of why she is  feeling the way she is, not feel swimmy headed.   OBJECTIVE:   DIAGNOSTIC FINDINGS: CT head 10/03/22  IMPRESSION: 1. No acute intracranial process. 2. 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 No evidence of recurrent mass or mass effect on this unenhanced exam  VESTIBULAR TREATMENT:                                                                                                    Vitals:   02/28/23 1236  BP: (!) 141/92  Pulse: 78   TherAct: Assessment of Goals   M-CTSIB   Condition 1: Firm Surface, EO 30 Sec, Normal Sway  Condition 2: Firm Surface, EC 30 Sec, Mod Sway (tends towards the L)  Condition 3: Foam Surface, EO 30 Sec, Normal Sway  Condition 4: Foam Surface, EC 5 Sec, Max sway and lean to L      Dynamic Visual Acuity: Static: Line 9 Dynamic: Line 7 Difference: 2 lines   [facility] PT Assessment - 02/28/23 0001       Functional Gait  Assessment   Gait assessed  Yes    Gait Level Surface Walks 20 ft in less than 5.5 sec, no assistive devices, good speed, no evidence for imbalance, normal gait pattern, deviates no more than 6 in outside of the 12 in walkway width.    Change in Gait Speed Able to smoothly change walking speed without loss of balance or gait deviation. Deviate no more than 6 in outside of the 12 in walkway width.    Gait with Horizontal Head Turns Performs head turns smoothly with slight change in gait velocity (eg, minor disruption to smooth gait path), deviates 6-10 in outside 12 in walkway width, or uses an assistive device.    Gait with Vertical Head Turns Performs task with slight change in gait velocity (eg, minor disruption to smooth gait path), deviates 6 - 10 in outside 12 in walkway width or uses assistive device    Gait and Pivot Turn Pivot turns safely within 3 sec and stops quickly with no loss of balance.    Step Over Obstacle Is able to step over 2 stacked shoe boxes taped together (9 in total height) without changing gait speed. No evidence of imbalance.    Gait with Narrow Base of Support Is able to ambulate for 10 steps heel to toe with no staggering.    Gait with Eyes Closed Cannot walk 20 ft without assistance, severe gait deviations or imbalance, deviates greater than 15 in outside 12 in walkway width or will not attempt task.    Ambulating Backwards Walks 20 ft, uses assistive device, slower speed, mild gait deviations, deviates 6-10 in outside 12 in walkway width.    Steps Alternating feet, must use rail.    Total  Score 23    FGA comment: 23/30             NMR (CGA-minA): EC on foam WBOS progressing towards NBOS 4-6 trials each with 5-30" success, NBOS best score of 11 seconds Tall kneel EC 4  x 15-30 seconds Tall kneel on foam EC 4 x 10-20 seconds Half kneel on foam EC 4 x 5-15 seconds   PATIENT EDUCATION: Education details: Continue POC Person educated: Patient Education method: Medical illustrator Education comprehension: verbalized understanding and returned demonstration  HOME EXERCISE PROGRAM: Gait with head turns to HEP   Access Code: [email or web address] URL: [email or web address] Date: 02/09/2023 Prepared by: Sherlie Ban   Initiated HEP, see MedBridge for details   Exercises - Wide Stance with Eyes Closed on Foam Pad  - 2 x daily - 5-7 x weekly - 3 sets - 30 hold - pt with incr postural sway, needing intermittent taps for balance  - Romberg Stance on Foam Pad with Head Rotation  - 2 x daily - 5-7 x weekly - 2 sets - 10 reps - EO with slight space between feet x10 reps head nods, x10 reps head turns. Pt more challenged with nods  - Tandem Walking with Counter Support  - 2 x daily - 5-7 x weekly - 3 sets - VOR x 1 viewing goal of 6x a day as tolerated duration ~30 seconds  GOALS: Goals reviewed with patient? Yes  SHORT TERM GOALS: ALL STGS = LTGS  LONG TERM GOALS: Target date: 03/28/2023 (Updated date given progress/remaining deficits noted in today's session)   Pt will be independent with final HEP for vestibular related deficits in order to build upon functional gains made in therapy. Baseline: Reports compliance thus far Goal status: IN PROGRESS  2.  Pt will improve FGA to at least a 26/30 in order to demo decr fall risk. Baseline: 23/30; 23/30 (improved NBOS tandem but increased difficulty on stairs)  Goal status: IN PROGRESS  3.   Pt will improve DVA to a 1 line difference or less in order to demo improved VOR  Baseline: 3 line difference; 2 line  difference  Goal status: IN PROGRESS  4.  Pt will improve condition 4 of mCTSIB to at least 10 seconds in order to demo improved vestibular input for balance.  Baseline: 2.5 seconds; improved to 5 seconds  Goal status: IN PROGRESS   ASSESSMENT:  CLINICAL IMPRESSION: Session emphasized assessment of patient's functional progress towards goals. Patient demonstrated  progress towards 4/4 goals; however, given remaining deficits recommend ongoing POC. Patient EC balance continues to demonstrate notable impairment. Recommend incorporation of stepping/reactive strategies progressing forward to improve safety.  Will update and continue POC.   OBJECTIVE IMPAIRMENTS: Abnormal gait, decreased activity tolerance, decreased balance, difficulty walking, and dizziness.   ACTIVITY LIMITATIONS: locomotion level  PARTICIPATION LIMITATIONS: community activity, roller skating   PERSONAL FACTORS: Past/current experiences, Time since onset of injury/illness/exacerbation, and 3+ comorbidities: anemia, anxiety, asthma, depression, GERD, migraine, meningioma s/p resection 07/2009   are also affecting patient's functional outcome.   REHAB POTENTIAL: Good  CLINICAL DECISION MAKING: Stable/uncomplicated  EVALUATION COMPLEXITY: Low   PLAN:  PT FREQUENCY: 1-2x/week (Recommending additional 1-2x week for 4 weeks)   PT DURATION: 4 weeks  PLANNED INTERVENTIONS: Therapeutic exercises, Therapeutic activity, Neuromuscular re-education, Balance training, Gait training, Patient/Family education, Self Care, Vestibular training, and Re-evaluation  PLAN FOR NEXT SESSION:  work on stepping reaction/compensation strategies particular when she loses her balance with less visual feedback progress VOR as able, work on eyes closed, head motions, tandems. High level balance for vestibular input, item pickups with balance tasks on compliant surfaces, work on dynamic on compliant surfaces with head turns  Carmelia Bake, PT,  DPT  02/28/2023, 5:19 PM

## 2023-03-02 ENCOUNTER — Encounter: Payer: Self-pay | Admitting: Physical Therapy

## 2023-03-02 ENCOUNTER — Ambulatory Visit: Payer: BC Managed Care – PPO | Admitting: Physical Therapy

## 2023-03-02 DIAGNOSIS — R2681 Unsteadiness on feet: Secondary | ICD-10-CM

## 2023-03-02 NOTE — Therapy (Signed)
OUTPATIENT PHYSICAL THERAPY VESTIBULAR TREATMENT     Patient Name: Deborah Ortega MRN: [id number] DOB:10/26/1965, 57 y.o., female Today's Date: 03/02/2023  END OF SESSION:  PT End of Session - 03/02/23 0936     Visit Number 7    Number of Visits 14    Date for PT Re-Evaluation 04/11/23    Authorization Type BCBS    PT Start Time [phone]    PT Stop Time 1015    PT Time Calculation (min) 41 min    Equipment Utilized During Treatment Gait belt    Activity Tolerance Patient tolerated treatment well    Behavior During Therapy WFL for tasks assessed/performed             Past Medical History:  Diagnosis Date   ALLERGIC RHINITIS    Anemia    Anxiety    Asthma    being worked up for asthma now. Chronic cough   Depression    takes lexapro   GERD    History of kidney stones    HOH (hard of hearing)    both ears   MENINGIOMA    s/p resction 07/2009   MIGRAINE HEADACHE    PONV (postoperative nausea and vomiting)    RENAL CALCULUS, HX OF 06/25/2007, 02/2013   THYROID NODULE    Past Surgical History:  Procedure Laterality Date   ANKLE SURGERY Right 04/13/2018   BREAST BIOPSY Right 10/29/2013   BREAST LUMPECTOMY WITH RADIOACTIVE SEED LOCALIZATION Right 11/19/2013   Procedure: BREAST LUMPECTOMY WITH RADIOACTIVE SEED LOCALIZATION;  Surgeon: Ernestene Mention, MD;  Location: [facility];  Service: General;  Laterality: Right;   CESAREAN SECTION     x's 2   EXTRACORPOREAL SHOCK WAVE LITHOTRIPSY Left 12/17/2021   Procedure: LEFTEXTRACORPOREAL SHOCK WAVE LITHOTRIPSY (ESWL);  Surgeon: Jerilee Field, MD;  Location: [facility];  Service: Urology;  Laterality: Left;   Meninogioma Resection  07/25/09   pariatal area head-crainiotomy-plate   WISDOM TOOTH EXTRACTION     Patient Active Problem List   Diagnosis Date Noted   Menopause 01/07/2023   Abnormal cervical Papanicolaou smear 08/13/2022   Dysphonia 08/13/2022   Dizziness 10/02/2021   Sensorineural  hearing loss (SNHL), bilateral 01/30/2019   Anemia 03/31/2017   History of brain tumor 03/31/2017   Chronic cough 09/15/2015   LPRD (laryngopharyngeal reflux disease) 09/15/2015   Routine general medical examination at a health care facility 12/15/2014   Intracranial tumor (HCC) 11/18/2014   Cough variant asthma 07/08/2014   Fibroadenoma of right breast 01/14/2014   THYROID NODULE 07/16/2009   Allergic rhinitis 07/15/2009   GERD 07/15/2009    PCP:  Myrlene Broker, MD REFERRING PROVIDER:  Myrlene Broker, MD  REFERRING DIAG: R42 (ICD-10-CM) - Dizziness   THERAPY DIAG:  Unsteadiness on feet  ONSET DATE: 01/07/2023  Rationale for Evaluation and Treatment: Rehabilitation  SUBJECTIVE:   SUBJECTIVE STATEMENT: Reports nothing new since she was last here. Symptoms are feeling about the same. Still having trouble with her balance with eyes closed.   Pt accompanied by: self  PERTINENT HISTORY: PMH: anemia, anxiety, asthma, depression, GERD, migraine, meningioma s/p resection 07/2009   PAIN:  Are you having pain? No  PRECAUTIONS: None  WEIGHT BEARING RESTRICTIONS: No  FALLS: Has patient fallen in last 6 months? No   PLOF: Independent, Vocation/Vocational requirements: Retired , and Leisure: Health visitor, yoga   PATIENT GOALS: Wants to get to the root of why she is feeling the way she  is, not feel swimmy headed.   OBJECTIVE:   DIAGNOSTIC FINDINGS: CT head 10/03/22  IMPRESSION: 1. No acute intracranial process. 2. 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 No evidence of recurrent mass or mass effect on this unenhanced exam  VESTIBULAR TREATMENT:                                                                                                    There were no vitals filed for this visit.    NMR: EC on level ground with wide BOS > feet hip width distance 3 x 5 reps sit <> stands EC  on air ex with wide BOS > feet hip width 2 x 5 reps sit <> stands, during 2nd set, pt needing chair anteriorly for balance and CGA/min A On green physioball: Steady bouncing with feet closer together x30 seconds, then 2x5 reps head turns, 2x5 reps head nods with feet hip width > close together Gentle bouncing EC 4 x 30 seconds with wide BOS> hip width > narrow BOS, with narrow BOS pt needing CGA/min A and intermittently holding onto the chair for balance  On air ex: Alternating forward stepping strategy x10 reps, then adding in head turns x10 reps each side, and x10 reps head nods when stepping on/off, pt more challenged with head nods In corner: 3/4 tandem > full tandem bilaterally with ball toss with 2nd PT and primary PT providing min guard for balance, approx. 20-25 tosses each side in different directions. Most challenged when tossing up. Pt able to use stepping strategy at times to maintain balance   PATIENT EDUCATION: Education details: Continue POC, HEP  Person educated: Patient Education method: Medical illustrator Education comprehension: verbalized understanding and returned demonstration  HOME EXERCISE PROGRAM: Gait with head turns to HEP   Access Code: [email or web address] URL: [email or web address] Date: 02/09/2023 Prepared by: Sherlie Ban   Initiated HEP, see MedBridge for details   Exercises - Wide Stance with Eyes Closed on Foam Pad  - 2 x daily - 5-7 x weekly - 3 sets - 30 hold - pt with incr postural sway, needing intermittent taps for balance  - Romberg Stance on Foam Pad with Head Rotation  - 2 x daily - 5-7 x weekly - 2 sets - 10 reps - EO with slight space between feet x10 reps head nods, x10 reps head turns. Pt more challenged with nods  - Tandem Walking with Counter Support  - 2 x daily - 5-7 x weekly - 3 sets - VOR x 1 viewing goal of 6x a day as tolerated duration ~30 seconds  GOALS: Goals reviewed with patient? Yes  SHORT TERM GOALS: ALL  STGS = LTGS  LONG TERM GOALS: Target date: 03/28/2023 (Updated date given progress/remaining deficits noted in today's session)   Pt will be independent with final HEP for vestibular related deficits in order to build upon functional gains made in therapy. Baseline: Reports compliance thus far Goal status: IN PROGRESS  2.  Pt will improve FGA to at least a 26/30 in order to  demo decr fall risk. Baseline: 23/30; 23/30 (improved NBOS tandem but increased difficulty on stairs)  Goal status: IN PROGRESS  3.   Pt will improve DVA to a 1 line difference or less in order to demo improved VOR  Baseline: 3 line difference; 2 line difference  Goal status: IN PROGRESS  4.  Pt will improve condition 4 of mCTSIB to at least 10 seconds in order to demo improved vestibular input for balance.  Baseline: 2.5 seconds; improved to 5 seconds  Goal status: IN PROGRESS   ASSESSMENT:  CLINICAL IMPRESSION: Today's skilled session focused on balance strategies on compliant surfaces with head motions, vision removed, and narrow BOS. Pt continues to be challenged most by balance with narrow BOS, esp with EC. With incr reps, pt does demo improved balance and is less unsteady. CGA/min A needed at times for balance tasks with EC for balance. Will continue to progress towards LTGs.     OBJECTIVE IMPAIRMENTS: Abnormal gait, decreased activity tolerance, decreased balance, difficulty walking, and dizziness.   ACTIVITY LIMITATIONS: locomotion level  PARTICIPATION LIMITATIONS: community activity, roller skating   PERSONAL FACTORS: Past/current experiences, Time since onset of injury/illness/exacerbation, and 3+ comorbidities: anemia, anxiety, asthma, depression, GERD, migraine, meningioma s/p resection 07/2009   are also affecting patient's functional outcome.   REHAB POTENTIAL: Good  CLINICAL DECISION MAKING: Stable/uncomplicated  EVALUATION COMPLEXITY: Low   PLAN:  PT FREQUENCY: 1-2x/week (Recommending  additional 1-2x week for 4 weeks)   PT DURATION: 4 weeks  PLANNED INTERVENTIONS: Therapeutic exercises, Therapeutic activity, Neuromuscular re-education, Balance training, Gait training, Patient/Family education, Self Care, Vestibular training, and Re-evaluation  PLAN FOR NEXT SESSION:  work on stepping reaction/compensation strategies particular when she loses her balance with less visual feedback progress VOR as able, work on eyes closed, head motions, tandems. High level balance for vestibular input, item pickups with balance tasks on compliant surfaces, work on dynamic on compliant surfaces with head turns  Drake Leach, PT, DPT  03/02/2023, 10:15 AM

## 2023-03-07 ENCOUNTER — Encounter: Payer: Self-pay | Admitting: Physical Therapy

## 2023-03-07 ENCOUNTER — Ambulatory Visit: Payer: BC Managed Care – PPO | Admitting: Physical Therapy

## 2023-03-07 DIAGNOSIS — R2681 Unsteadiness on feet: Secondary | ICD-10-CM

## 2023-03-07 NOTE — Therapy (Signed)
OUTPATIENT PHYSICAL THERAPY VESTIBULAR TREATMENT     Patient Name: Deborah Ortega MRN: [id number] DOB:1966-02-18, 57 y.o., female Today's Date: 03/07/2023  END OF SESSION:  PT End of Session - 03/07/23 1233     Visit Number 8    Number of Visits 14    Date for PT Re-Evaluation 04/11/23    Authorization Type BCBS    PT Start Time 1232    PT Stop Time 1311    PT Time Calculation (min) 39 min    Equipment Utilized During Treatment Gait belt    Activity Tolerance Patient tolerated treatment well    Behavior During Therapy WFL for tasks assessed/performed             Past Medical History:  Diagnosis Date   ALLERGIC RHINITIS    Anemia    Anxiety    Asthma    being worked up for asthma now. Chronic cough   Depression    takes lexapro   GERD    History of kidney stones    HOH (hard of hearing)    both ears   MENINGIOMA    s/p resction 07/2009   MIGRAINE HEADACHE    PONV (postoperative nausea and vomiting)    RENAL CALCULUS, HX OF 06/25/2007, 02/2013   THYROID NODULE    Past Surgical History:  Procedure Laterality Date   ANKLE SURGERY Right 04/13/2018   BREAST BIOPSY Right 10/29/2013   BREAST LUMPECTOMY WITH RADIOACTIVE SEED LOCALIZATION Right 11/19/2013   Procedure: BREAST LUMPECTOMY WITH RADIOACTIVE SEED LOCALIZATION;  Surgeon: Ernestene Mention, MD;  Location: [facility];  Service: General;  Laterality: Right;   CESAREAN SECTION     x's 2   EXTRACORPOREAL SHOCK WAVE LITHOTRIPSY Left 12/17/2021   Procedure: LEFTEXTRACORPOREAL SHOCK WAVE LITHOTRIPSY (ESWL);  Surgeon: Jerilee Field, MD;  Location: [facility];  Service: Urology;  Laterality: Left;   Meninogioma Resection  07/25/09   pariatal area head-crainiotomy-plate   WISDOM TOOTH EXTRACTION     Patient Active Problem List   Diagnosis Date Noted   Menopause 01/07/2023   Abnormal cervical Papanicolaou smear 08/13/2022   Dysphonia 08/13/2022   Dizziness 10/02/2021   Sensorineural  hearing loss (SNHL), bilateral 01/30/2019   Anemia 03/31/2017   History of brain tumor 03/31/2017   Chronic cough 09/15/2015   LPRD (laryngopharyngeal reflux disease) 09/15/2015   Routine general medical examination at a health care facility 12/15/2014   Intracranial tumor (HCC) 11/18/2014   Cough variant asthma 07/08/2014   Fibroadenoma of right breast 01/14/2014   THYROID NODULE 07/16/2009   Allergic rhinitis 07/15/2009   GERD 07/15/2009    PCP:  Myrlene Broker, MD REFERRING PROVIDER:  Myrlene Broker, MD  REFERRING DIAG: R42 (ICD-10-CM) - Dizziness   THERAPY DIAG:  Unsteadiness on feet  ONSET DATE: 01/07/2023  Rationale for Evaluation and Treatment: Rehabilitation  SUBJECTIVE:   SUBJECTIVE STATEMENT: Tried a balance video online this morning   Pt accompanied by: self  PERTINENT HISTORY: PMH: anemia, anxiety, asthma, depression, GERD, migraine, meningioma s/p resection 07/2009   PAIN:  Are you having pain? No  PRECAUTIONS: None  WEIGHT BEARING RESTRICTIONS: No  FALLS: Has patient fallen in last 6 months? No   PLOF: Independent, Vocation/Vocational requirements: Retired , and Leisure: Health visitor, yoga   PATIENT GOALS: Wants to get to the root of why she is feeling the way she is, not feel swimmy headed.   OBJECTIVE:   DIAGNOSTIC FINDINGS: CT head 10/03/22  IMPRESSION: 1. No acute intracranial process. 2. 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 No evidence of recurrent mass or mass effect on this unenhanced exam  VESTIBULAR TREATMENT:                                                                                                    There were no vitals filed for this visit.    NMR: Gaze Adaptation: x1 Viewing Horizontal: Position: standing with feet together, Time: 30 seconds, Reps: 2, and Comment: with busy background  x1 Viewing Vertical:  Position: standing  with feet together, Time: 30 seconds, Reps: 2, and Comment: with busy background No dizziness, just some off balance with feet together   Forward gait with turning to R/L to pass ball x230' to therapist and student volunteer to work on turning, head motions. No dizziness or off balance   In corner:  On level ground with EC: 2 x 10 reps head turns, 2 x 10 reps head nods, pt initially needing to use walls for balance, progressing to no UE support  On air ex with feet apart: EC trunk rotations reaching to tap wall 3 x 10 reps, pt improvements with incr reps, but needing to tap walls and chair and min A at times from therapist for balance   In // bars: Modified SLS with one foot on green disc, EO x10 reps head turns with each leg as stance leg, x10 reps head nods with each leg as stance leg Tandem gait with ball toss, forwards and backwards x3 reps, pt needing UE support for balance   EC, alternating forward stepping strategy 2 x 10 reps, then 10 reps alternating posterior stepping strategy, pt needing intermittent UE support for balance, more difficulty in the posterior direction   PATIENT EDUCATION: Education details: Reviewed HEP - esp balance tasks with EC (focusing more on these at home and doing them daily to see if it will help with EC as pt not these as frequently at home), progressing standing VOR x1 to feet together  Person educated: Patient Education method: Explanation and Demonstration Education comprehension: verbalized understanding and returned demonstration  HOME EXERCISE PROGRAM: Gait with head turns to HEP Standing VOR x1 with feet together and busy background x30 seconds   Access Code: [email or web address] URL: [email or web address] Date: 03/07/2023 Prepared by: Sherlie Ban  Exercises - Wide Stance with Eyes Closed on Foam Pad  - 2 x daily - 5-7 x weekly - 3 sets - 30 hold - Romberg Stance on Foam Pad with Head Rotation  - 2 x daily - 5-7 x weekly - 2 sets - 10  reps - Tandem Walking with Counter Support  - 2 x daily - 5-7 x weekly - 3 sets - Romberg Stance with Eyes Closed  - 1 x daily - 5 x weekly - 2 sets - 10 reps - with head turns and head nods   GOALS: Goals reviewed with patient? Yes  SHORT TERM GOALS: ALL STGS = LTGS  LONG TERM GOALS: Target date: 03/28/2023 (Updated date given progress/remaining  deficits noted in today's session)   Pt will be independent with final HEP for vestibular related deficits in order to build upon functional gains made in therapy. Baseline: Reports compliance thus far Goal status: IN PROGRESS  2.  Pt will improve FGA to at least a 26/30 in order to demo decr fall risk. Baseline: 23/30; 23/30 (improved NBOS tandem but increased difficulty on stairs)  Goal status: IN PROGRESS  3.   Pt will improve DVA to a 1 line difference or less in order to demo improved VOR  Baseline: 3 line difference; 2 line difference  Goal status: IN PROGRESS  4.  Pt will improve condition 4 of mCTSIB to at least 10 seconds in order to demo improved vestibular input for balance.  Baseline: 2.5 seconds; improved to 5 seconds  Goal status: IN PROGRESS   ASSESSMENT:  CLINICAL IMPRESSION:   Today's skilled session continued to focus on balance with EC, unlevel surfaces, narrow BOS, and head motions. Pt continues to be challenged most by tasks with EC, esp when on compliant surfaces with pt needing min A at times for balance and taps to walls/chairs. Reviewed HEP with discussion to try to focus on EC exercises 1x a day to see if incr repetition will help. Will continue to progress towards LTGs.     OBJECTIVE IMPAIRMENTS: Abnormal gait, decreased activity tolerance, decreased balance, difficulty walking, and dizziness.   ACTIVITY LIMITATIONS: locomotion level  PARTICIPATION LIMITATIONS: community activity, roller skating   PERSONAL FACTORS: Past/current experiences, Time since onset of injury/illness/exacerbation, and 3+  comorbidities: anemia, anxiety, asthma, depression, GERD, migraine, meningioma s/p resection 07/2009   are also affecting patient's functional outcome.   REHAB POTENTIAL: Good  CLINICAL DECISION MAKING: Stable/uncomplicated  EVALUATION COMPLEXITY: Low   PLAN:  PT FREQUENCY: 1-2x/week (Recommending additional 1-2x week for 4 weeks)   PT DURATION: 4 weeks  PLANNED INTERVENTIONS: Therapeutic exercises, Therapeutic activity, Neuromuscular re-education, Balance training, Gait training, Patient/Family education, Self Care, Vestibular training, and Re-evaluation  PLAN FOR NEXT SESSION:  work on stepping reaction/compensation strategies particular when she loses her balance with less visual feedback progress VOR as able, work on eyes closed, head motions, tandems. High level balance for vestibular input, item pickups with balance tasks on compliant surfaces, work on dynamic on compliant surfaces with head turns  Drake Leach, PT, DPT  03/07/2023, 1:26 PM

## 2023-03-09 ENCOUNTER — Ambulatory Visit: Payer: BC Managed Care – PPO | Admitting: Physical Therapy

## 2023-03-09 ENCOUNTER — Encounter: Payer: Self-pay | Admitting: Physical Therapy

## 2023-03-09 DIAGNOSIS — R2681 Unsteadiness on feet: Secondary | ICD-10-CM

## 2023-03-09 NOTE — Therapy (Signed)
OUTPATIENT PHYSICAL THERAPY VESTIBULAR TREATMENT     Patient Name: Deborah Ortega MRN: [id number] DOB:[birth year], 57 y.o., female Today's Date: 03/09/2023  END OF SESSION:  PT End of Session - 03/09/23 0934     Visit Number 9    Number of Visits 14    Date for PT Re-Evaluation 04/11/23    Authorization Type BCBS    PT Start Time 0933    PT Stop Time 1013    PT Time Calculation (min) 40 min    Equipment Utilized During Treatment Gait belt    Activity Tolerance Patient tolerated treatment well    Behavior During Therapy WFL for tasks assessed/performed             Past Medical History:  Diagnosis Date   ALLERGIC RHINITIS    Anemia    Anxiety    Asthma    being worked up for asthma now. Chronic cough   Depression    takes lexapro   GERD    History of kidney stones    HOH (hard of hearing)    both ears   MENINGIOMA    s/p resction 07/2009   MIGRAINE HEADACHE    PONV (postoperative nausea and vomiting)    RENAL CALCULUS, HX OF 06/25/2007, 02/2013   THYROID NODULE    Past Surgical History:  Procedure Laterality Date   ANKLE SURGERY Right 04/13/2018   BREAST BIOPSY Right 10/29/2013   BREAST LUMPECTOMY WITH RADIOACTIVE SEED LOCALIZATION Right 11/19/2013   Procedure: BREAST LUMPECTOMY WITH RADIOACTIVE SEED LOCALIZATION;  Surgeon: Ernestene Mention, MD;  Location: [facility];  Service: General;  Laterality: Right;   CESAREAN SECTION     x's 2   EXTRACORPOREAL SHOCK WAVE LITHOTRIPSY Left 12/17/2021   Procedure: LEFTEXTRACORPOREAL SHOCK WAVE LITHOTRIPSY (ESWL);  Surgeon: Jerilee Field, MD;  Location: [facility];  Service: Urology;  Laterality: Left;   Meninogioma Resection  07/25/09   pariatal area head-crainiotomy-plate   WISDOM TOOTH EXTRACTION     Patient Active Problem List   Diagnosis Date Noted   Menopause 01/07/2023   Abnormal cervical Papanicolaou smear 08/13/2022   Dysphonia 08/13/2022   Dizziness 10/02/2021   Sensorineural  hearing loss (SNHL), bilateral 01/30/2019   Anemia 03/31/2017   History of brain tumor 03/31/2017   Chronic cough 09/15/2015   LPRD (laryngopharyngeal reflux disease) 09/15/2015   Routine general medical examination at a health care facility 12/15/2014   Intracranial tumor (HCC) 11/18/2014   Cough variant asthma 07/08/2014   Fibroadenoma of right breast 01/14/2014   THYROID NODULE 07/16/2009   Allergic rhinitis 07/15/2009   GERD 07/15/2009    PCP:  Myrlene Broker, MD REFERRING PROVIDER:  Myrlene Broker, MD  REFERRING DIAG: R42 (ICD-10-CM) - Dizziness   THERAPY DIAG:  Unsteadiness on feet  ONSET DATE: 01/07/2023  Rationale for Evaluation and Treatment: Rehabilitation  SUBJECTIVE:   SUBJECTIVE STATEMENT: Nothing new since she was last here.  Pt accompanied by: self  PERTINENT HISTORY: PMH: anemia, anxiety, asthma, depression, GERD, migraine, meningioma s/p resection 07/2009   PAIN:  Are you having pain? No  PRECAUTIONS: None  WEIGHT BEARING RESTRICTIONS: No  FALLS: Has patient fallen in last 6 months? No   PLOF: Independent, Vocation/Vocational requirements: Retired , and Leisure: Health visitor, yoga   PATIENT GOALS: Wants to get to the root of why she is feeling the way she is, not feel swimmy headed.   OBJECTIVE:   DIAGNOSTIC FINDINGS: CT head 10/03/22  IMPRESSION:  1. No acute intracranial process. 2. 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 No evidence of recurrent mass or mass effect on this unenhanced exam  VESTIBULAR TREATMENT:                                                                                                    There were no vitals filed for this visit.   NMR: On rockerboard in A/P direction:  Alternating SLS taps to 2 cones x10 reps then forward and cross body tap x10 reps Alternating step up with contralateral march for dynamic SLS x10 reps,  then adding in head turns when stepping up x10 reps, pt more unsteady with head turns   On air ex: Tall kneeling:  EO x10 reps head turns, nods, and diagonals each direction EC 3 x 30 seconds 2 x 5 reps sit <> stands with EC, then 2 x 5 reps mini squats with EC, pt doing better with 2nd set   On blue mat: Half kneeling with holding 2 kg yellow ball and performing trunk rotations looking with head/eyes 2 x 10 reps with each leg posteriorly, pt more unsteady with RLE anteriorly   On single pillow: EC wide BOS > feet hip width 2 x30 seconds, then feet together 2 x 30 seconds (pt only needing to tap wall once for balance) EC wide BOS > feet hip width 2 x 10 reps head turns, 2 x 10 reps head nods   PATIENT EDUCATION: Education details: Continue HEP  Person educated: Patient Education method: Medical illustrator Education comprehension: verbalized understanding and returned demonstration  HOME EXERCISE PROGRAM: Gait with head turns to HEP Standing VOR x1 with feet together and busy background x30 seconds   Access Code: [email or web address] URL: [email or web address] Date: 03/07/2023 Prepared by: Sherlie Ban  Exercises - Wide Stance with Eyes Closed on Foam Pad  - 2 x daily - 5-7 x weekly - 3 sets - 30 hold - Romberg Stance on Foam Pad with Head Rotation  - 2 x daily - 5-7 x weekly - 2 sets - 10 reps - Tandem Walking with Counter Support  - 2 x daily - 5-7 x weekly - 3 sets - Romberg Stance with Eyes Closed  - 1 x daily - 5 x weekly - 2 sets - 10 reps - with head turns and head nods   GOALS: Goals reviewed with patient? Yes  SHORT TERM GOALS: ALL STGS = LTGS  LONG TERM GOALS: Target date: 03/28/2023 (Updated date given progress/remaining deficits noted in today's session)   Pt will be independent with final HEP for vestibular related deficits in order to build upon functional gains made in therapy. Baseline: Reports compliance thus far Goal status: IN  PROGRESS  2.  Pt will improve FGA to at least a 26/30 in order to demo decr fall risk. Baseline: 23/30; 23/30 (improved NBOS tandem but increased difficulty on stairs)  Goal status: IN PROGRESS  3.   Pt will improve DVA to a 1 line difference or less in order to demo improved VOR  Baseline: 3 line difference; 2 line difference  Goal status: IN PROGRESS  4.  Pt will improve condition 4 of mCTSIB to at least 10 seconds in order to demo improved vestibular input for balance.  Baseline: 2.5 seconds; improved to 5 seconds  Goal status: IN PROGRESS   ASSESSMENT:  CLINICAL IMPRESSION:   Today's skilled session continued to focus on balance with EC, unlevel surfaces, narrow BOS, and head motions. Pt continues to be most challenged with tasks with EC. When performing EC tasks on a single pillow, pt more steady today and able to hold without as many taps to the wall or losing her balance. Encouraged pt to perform EC balance tasks at home from HEP. Will continue to progress towards LTGs.     OBJECTIVE IMPAIRMENTS: Abnormal gait, decreased activity tolerance, decreased balance, difficulty walking, and dizziness.   ACTIVITY LIMITATIONS: locomotion level  PARTICIPATION LIMITATIONS: community activity, roller skating   PERSONAL FACTORS: Past/current experiences, Time since onset of injury/illness/exacerbation, and 3+ comorbidities: anemia, anxiety, asthma, depression, GERD, migraine, meningioma s/p resection 07/2009   are also affecting patient's functional outcome.   REHAB POTENTIAL: Good  CLINICAL DECISION MAKING: Stable/uncomplicated  EVALUATION COMPLEXITY: Low   PLAN:  PT FREQUENCY: 1-2x/week (Recommending additional 1-2x week for 4 weeks)   PT DURATION: 4 weeks  PLANNED INTERVENTIONS: Therapeutic exercises, Therapeutic activity, Neuromuscular re-education, Balance training, Gait training, Patient/Family education, Self Care, Vestibular training, and Re-evaluation  PLAN FOR NEXT  SESSION:  work on stepping reaction/compensation strategies particular when she loses her balance with less visual feedback progress VOR as able, work on eyes closed, head motions, tandems. High level balance for vestibular input, item pickups with balance tasks on compliant surfaces, work on dynamic on compliant surfaces with head turns  Drake Leach, PT, DPT  03/09/2023, 10:14 AM

## 2023-03-16 ENCOUNTER — Ambulatory Visit: Payer: BC Managed Care – PPO | Admitting: Physical Therapy

## 2023-03-16 ENCOUNTER — Encounter: Payer: Self-pay | Admitting: Physical Therapy

## 2023-03-16 DIAGNOSIS — R2681 Unsteadiness on feet: Secondary | ICD-10-CM

## 2023-03-16 NOTE — Therapy (Signed)
OUTPATIENT PHYSICAL THERAPY VESTIBULAR TREATMENT     Patient Name: Deborah Ortega MRN: [id number] DOB:Jun 30, 1966, 57 y.o., female Today's Date: 03/16/2023  END OF SESSION:  PT End of Session - 03/16/23 1617     Visit Number 10    Number of Visits 14    Date for PT Re-Evaluation 04/11/23    Authorization Type BCBS    PT Start Time 1616    PT Stop Time 1657    PT Time Calculation (min) 41 min    Equipment Utilized During Treatment Gait belt    Activity Tolerance Patient tolerated treatment well    Behavior During Therapy WFL for tasks assessed/performed             Past Medical History:  Diagnosis Date   ALLERGIC RHINITIS    Anemia    Anxiety    Asthma    being worked up for asthma now. Chronic cough   Depression    takes lexapro   GERD    History of kidney stones    HOH (hard of hearing)    both ears   MENINGIOMA    s/p resction 07/2009   MIGRAINE HEADACHE    PONV (postoperative nausea and vomiting)    RENAL CALCULUS, HX OF 06/25/2007, 02/2013   THYROID NODULE    Past Surgical History:  Procedure Laterality Date   ANKLE SURGERY Right 04/13/2018   BREAST BIOPSY Right 10/29/2013   BREAST LUMPECTOMY WITH RADIOACTIVE SEED LOCALIZATION Right 11/19/2013   Procedure: BREAST LUMPECTOMY WITH RADIOACTIVE SEED LOCALIZATION;  Surgeon: Ernestene Mention, MD;  Location: [facility];  Service: General;  Laterality: Right;   CESAREAN SECTION     x's 2   EXTRACORPOREAL SHOCK WAVE LITHOTRIPSY Left 12/17/2021   Procedure: LEFTEXTRACORPOREAL SHOCK WAVE LITHOTRIPSY (ESWL);  Surgeon: Jerilee Field, MD;  Location: [facility];  Service: Urology;  Laterality: Left;   Meninogioma Resection  07/25/09   pariatal area head-crainiotomy-plate   WISDOM TOOTH EXTRACTION     Patient Active Problem List   Diagnosis Date Noted   Menopause 01/07/2023   Abnormal cervical Papanicolaou smear 08/13/2022   Dysphonia 08/13/2022   Dizziness 10/02/2021    Sensorineural hearing loss (SNHL), bilateral 01/30/2019   Anemia 03/31/2017   History of brain tumor 03/31/2017   Chronic cough 09/15/2015   LPRD (laryngopharyngeal reflux disease) 09/15/2015   Routine general medical examination at a health care facility 12/15/2014   Intracranial tumor (HCC) 11/18/2014   Cough variant asthma 07/08/2014   Fibroadenoma of right breast 01/14/2014   THYROID NODULE 07/16/2009   Allergic rhinitis 07/15/2009   GERD 07/15/2009    PCP:  Myrlene Broker, MD REFERRING PROVIDER:  Myrlene Broker, MD  REFERRING DIAG: R42 (ICD-10-CM) - Dizziness   THERAPY DIAG:  Unsteadiness on feet  ONSET DATE: 01/07/2023  Rationale for Evaluation and Treatment: Rehabilitation  SUBJECTIVE:   SUBJECTIVE STATEMENT: Nothing changes. Feeling a little more steady when eyes are closed and brushing her teeth. Having difficulties with obstacles in the home, with stepping over things or going around things. Will bump into the walls at times, but have not fallen. Busy when doing something else at the same time.   Pt accompanied by: self  PERTINENT HISTORY: PMH: anemia, anxiety, asthma, depression, GERD, migraine, meningioma s/p resection 07/2009   PAIN:  Are you having pain? No  PRECAUTIONS: None  WEIGHT BEARING RESTRICTIONS: No  FALLS: Has patient fallen in last 6 months? No   PLOF: Independent, Vocation/Vocational  requirements: Retired , and Leisure: Health visitor, yoga   PATIENT GOALS: Wants to get to the root of why she is feeling the way she is, not feel swimmy headed.   OBJECTIVE:   DIAGNOSTIC FINDINGS: CT head 10/03/22  IMPRESSION: 1. No acute intracranial process. 2. 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 No evidence of recurrent mass or mass effect on this unenhanced exam  VESTIBULAR TREATMENT:                                                                                                     There were no vitals filed for this visit.   NMR: On blue foam beam in // bars for obstacle negotiation, SLS stability, and dual tasking stepping over 2 smaller orange and 2 larger orange obstacles: Forward step overs down and back x3 reps with focus on slowed pace with intermittent taps to bars for balance, added cognitive challenge by pt naming foods in alphabetical order with continued obstacle negotiation down and back x3 reps, pt most challenged by dual tasking  Lateral step overs down and back x2 reps then adding in manual dual task with ball toss with therapist for more upright gaze, down and back x2 reps, intermittent taps for balance  Forward gait around track tossing ball in R/L directions and naming animals in alphabetical order for dual tasking x230', no issues with balance, initially needing cues to ambulate and toss ball at the same time  Forward gait with steady resistance and progressing to varying resistance 345' x 1 and adding in cognitive dual task with naming cities/states in alphabetical order, pt able to use appropriate stepping strategy to stay balanced.  1/2 tandem > 3/4 tandem and beginning with wider BOS and then bringing feet closer together 4 x 5 reps each side tossing 4# medicine ball on floor and then catching it, pt more steady with LLE posteriorly On air ex: Narrow BOS and ball toss/catch to center and then R/L to reactive balance, performed approx. 20 reps total with pt able to use ankle strategy for balance and one time used stepping strategies  Wide BOS EC x30 seconds, with feet hip width distance 3 x 15 seconds and x30 seconds on last rep   PATIENT EDUCATION: Education details: Continue HEP, education on how dual tasking an affect balance  Person educated: Patient Education method: Medical illustrator Education comprehension: verbalized understanding and returned demonstration  HOME EXERCISE PROGRAM: Gait with head turns to  HEP Standing VOR x1 with feet together and busy background x30 seconds   Access Code: [email or web address] URL: [email or web address] Date: 03/07/2023 Prepared by: Sherlie Ban  Exercises - Wide Stance with Eyes Closed on Foam Pad  - 2 x daily - 5-7 x weekly - 3 sets - 30 hold - Romberg Stance on Foam Pad with Head Rotation  - 2 x daily - 5-7 x weekly - 2 sets - 10 reps - Tandem Walking with Counter Support  - 2 x daily - 5-7 x weekly - 3 sets - Romberg Stance with Eyes  Closed  - 1 x daily - 5 x weekly - 2 sets - 10 reps - with head turns and head nods   GOALS: Goals reviewed with patient? Yes  SHORT TERM GOALS: ALL STGS = LTGS  LONG TERM GOALS: Target date: 03/28/2023 (Updated date given progress/remaining deficits noted in today's session)   Pt will be independent with final HEP for vestibular related deficits in order to build upon functional gains made in therapy. Baseline: Reports compliance thus far Goal status: IN PROGRESS  2.  Pt will improve FGA to at least a 26/30 in order to demo decr fall risk. Baseline: 23/30; 23/30 (improved NBOS tandem but increased difficulty on stairs)  Goal status: IN PROGRESS  3.   Pt will improve DVA to a 1 line difference or less in order to demo improved VOR  Baseline: 3 line difference; 2 line difference  Goal status: IN PROGRESS  4.  Pt will improve condition 4 of mCTSIB to at least 10 seconds in order to demo improved vestibular input for balance.  Baseline: 2.5 seconds; improved to 5 seconds  Goal status: IN PROGRESS   ASSESSMENT:  CLINICAL IMPRESSION:   Pt reporting feeling off balance at home with obstacle negotiation. Worked on obstacles on compliant surfaces and SLS with addition of dual tasking. Pt challenged with this esp with SLS obstacles. Also worked on random perturbations with gait with dual tasking with pt able to maintain balance with appropriate stepping strategies. While holding medicine ball during narrow BOS  tasks, pt able to utilize ankle strategy more to keep balance instead of grabbing out to chairs or walls initially.  Will continue to progress towards LTGs.     OBJECTIVE IMPAIRMENTS: Abnormal gait, decreased activity tolerance, decreased balance, difficulty walking, and dizziness.   ACTIVITY LIMITATIONS: locomotion level  PARTICIPATION LIMITATIONS: community activity, roller skating   PERSONAL FACTORS: Past/current experiences, Time since onset of injury/illness/exacerbation, and 3+ comorbidities: anemia, anxiety, asthma, depression, GERD, migraine, meningioma s/p resection 07/2009   are also affecting patient's functional outcome.   REHAB POTENTIAL: Good  CLINICAL DECISION MAKING: Stable/uncomplicated  EVALUATION COMPLEXITY: Low   PLAN:  PT FREQUENCY: 1-2x/week (Recommending additional 1-2x week for 4 weeks)   PT DURATION: 4 weeks  PLANNED INTERVENTIONS: Therapeutic exercises, Therapeutic activity, Neuromuscular re-education, Balance training, Gait training, Patient/Family education, Self Care, Vestibular training, and Re-evaluation  PLAN FOR NEXT SESSION:  work on stepping reaction/compensation strategies particular when she loses her balance with less visual feedback progress VOR as able, work on eyes closed, head motions, tandems. High level balance for vestibular input, item pickups with balance tasks on compliant surfaces, work on dynamic on compliant surfaces with head turns  Drake Leach, PT, DPT  03/16/2023, 5:11 PM

## 2023-03-17 ENCOUNTER — Ambulatory Visit: Payer: BC Managed Care – PPO | Attending: Internal Medicine | Admitting: Physical Therapy

## 2023-03-17 ENCOUNTER — Encounter: Payer: Self-pay | Admitting: Physical Therapy

## 2023-03-17 VITALS — BP 120/68 | HR 77

## 2023-03-17 DIAGNOSIS — R2681 Unsteadiness on feet: Secondary | ICD-10-CM | POA: Diagnosis present

## 2023-03-17 NOTE — Therapy (Signed)
OUTPATIENT PHYSICAL THERAPY VESTIBULAR TREATMENT / PROGRESS NOTE     Patient Name: Deborah Ortega MRN: [id number] DOB:[birth year], 57 y.o., female Today's Date: 03/17/2023  END OF SESSION:  PT End of Session - 03/17/23 1457     Visit Number 11    Number of Visits 14    Date for PT Re-Evaluation 04/11/23    Authorization Type BCBS    PT Start Time 1455    PT Stop Time 1533    PT Time Calculation (min) 38 min    Equipment Utilized During Treatment Gait belt    Activity Tolerance Patient tolerated treatment well    Behavior During Therapy WFL for tasks assessed/performed             Past Medical History:  Diagnosis Date   ALLERGIC RHINITIS    Anemia    Anxiety    Asthma    being worked up for asthma now. Chronic cough   Depression    takes lexapro   GERD    History of kidney stones    HOH (hard of hearing)    both ears   MENINGIOMA    s/p resction 07/2009   MIGRAINE HEADACHE    PONV (postoperative nausea and vomiting)    RENAL CALCULUS, HX OF 06/25/2007, 02/2013   THYROID NODULE    Past Surgical History:  Procedure Laterality Date   ANKLE SURGERY Right 04/13/2018   BREAST BIOPSY Right 10/29/2013   BREAST LUMPECTOMY WITH RADIOACTIVE SEED LOCALIZATION Right 11/19/2013   Procedure: BREAST LUMPECTOMY WITH RADIOACTIVE SEED LOCALIZATION;  Surgeon: Ernestene Mention, MD;  Location: [facility];  Service: General;  Laterality: Right;   CESAREAN SECTION     x's 2   EXTRACORPOREAL SHOCK WAVE LITHOTRIPSY Left 12/17/2021   Procedure: LEFTEXTRACORPOREAL SHOCK WAVE LITHOTRIPSY (ESWL);  Surgeon: Jerilee Field, MD;  Location: [facility];  Service: Urology;  Laterality: Left;   Meninogioma Resection  07/25/09   pariatal area head-crainiotomy-plate   WISDOM TOOTH EXTRACTION     Patient Active Problem List   Diagnosis Date Noted   Menopause 01/07/2023   Abnormal cervical Papanicolaou smear 08/13/2022   Dysphonia 08/13/2022   Dizziness 10/02/2021    Sensorineural hearing loss (SNHL), bilateral 01/30/2019   Anemia 03/31/2017   History of brain tumor 03/31/2017   Chronic cough 09/15/2015   LPRD (laryngopharyngeal reflux disease) 09/15/2015   Routine general medical examination at a health care facility 12/15/2014   Intracranial tumor (HCC) 11/18/2014   Cough variant asthma 07/08/2014   Fibroadenoma of right breast 01/14/2014   THYROID NODULE 07/16/2009   Allergic rhinitis 07/15/2009   GERD 07/15/2009    PCP:  Myrlene Broker, MD REFERRING PROVIDER:  Myrlene Broker, MD  REFERRING DIAG: R42 (ICD-10-CM) - Dizziness   THERAPY DIAG:  Unsteadiness on feet  ONSET DATE: 01/07/2023  Rationale for Evaluation and Treatment: Rehabilitation  SUBJECTIVE:   SUBJECTIVE STATEMENT: Patient reports that new objects in house with remodeling has been greatest challenge. Patient feels like eyes closed continues to improve. Patient states she is continuing to see improvements. Reports some near falls but compensating well.   Pt accompanied by: self  PERTINENT HISTORY: PMH: anemia, anxiety, asthma, depression, GERD, migraine, meningioma s/p resection 07/2009   PAIN:  Are you having pain? No  PRECAUTIONS: None  WEIGHT BEARING RESTRICTIONS: No  FALLS: Has patient fallen in last 6 months? No  PLOF: Independent, Vocation/Vocational requirements: Retired , and Leisure: Health visitor, yoga   PATIENT  GOALS: Wants to get to the root of why she is feeling the way she is, not feel swimmy headed.   OBJECTIVE:   DIAGNOSTIC FINDINGS: CT head 10/03/22  IMPRESSION: 1. No acute intracranial process. 2. 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 No evidence of recurrent mass or mass effect on this unenhanced exam  VESTIBULAR TREATMENT:                                                                                                    Vitals:   03/17/23 1503   BP: 120/68  Pulse: 77   NMR:  Dynamic Visual Acuity: - Static: 10 - Dynamic: 8 - Overall: 2 Line Difference (WNL)  M-CTSIB  Condition 1: Firm Surface, EO 30 Sec, Normal Sway  Condition 2: Firm Surface, EC 30 Sec, Min Sway  Condition 3: Foam Surface, EO 30 Sec, Normal Sway  Condition 4: Foam Surface, EC 13 Sec, Max Sway   BOSU ball reactive steping back for stepping reaction without UE support 2 x 10 bil BOSU ball reactive steping laterally for stepping reaction without UE support 2 x 10 bil BOSU ball feet shoulder width apart EC balance 5 x 5-20" with CGA-minA  Reactive balance Testing (Scored as would on Mini[employer]): Backward EO: 2/2 Backwards EC: 1-2/2 (1.5 steps progressing to 1 step) Forward EO: 2/2 Forward EC: 2/2 Laterally L EO: 2/2 Laterally L EC: 2/2   Laterally R  EO: 2/2 Laterally R EC: 2/2    PATIENT EDUCATION: Education details: Continue HEP, education on how dual tasking an affect balance  Person educated: Patient Education method: Medical illustrator Education comprehension: verbalized understanding and returned demonstration  HOME EXERCISE PROGRAM: Gait with head turns to HEP Standing VOR x1 with feet together and busy background x30 seconds   Access Code: [email or web address] URL: [email or web address] Date: 03/07/2023 Prepared by: Sherlie Ban  Exercises - Wide Stance with Eyes Closed on Foam Pad  - 2 x daily - 5-7 x weekly - 3 sets - 30 hold - Romberg Stance on Foam Pad with Head Rotation  - 2 x daily - 5-7 x weekly - 2 sets - 10 reps - Tandem Walking with Counter Support  - 2 x daily - 5-7 x weekly - 3 sets - Romberg Stance with Eyes Closed  - 1 x daily - 5 x weekly - 2 sets - 10 reps - with head turns and head nods   GOALS: Goals reviewed with patient? Yes  SHORT TERM GOALS: ALL STGS = LTGS  LONG TERM GOALS: Target date: 03/28/2023 (Updated date given progress/remaining deficits noted in today's session)   Pt will be  independent with final HEP for vestibular related deficits in order to build upon functional gains made in therapy. Baseline: Reports compliance thus far, continues to report improvements in exercises Goal status: IN PROGRESS  2.  Pt will improve FGA to at least a 26/30 in order to demo decr fall risk. Baseline: 23/30; 23/30 (improved NBOS tandem but increased difficulty on stairs)  Goal status: IN PROGRESS  3.  Pt will improve DVA to a 1 line difference or less in order to demo improved VOR  Baseline: 3 line difference; 2 line difference; continues to be at a 2 line difference  Goal status: IN PROGRESS  4.  Pt will improve condition 4 of mCTSIB to at least 15 seconds in order to demo improved vestibular input for balance.  Baseline: 2.5 seconds; improved to 5 seconds; 13 seconds  Goal status: REVISED   ASSESSMENT:  CLINICAL IMPRESSION:   Session included brief check on patient's progress in addition to work on compliant surface, reduced visual support, and stepping reaction. Patient achieved 10 seconds on mCTSIB in today's session so progressed goal to 15 seconds. Patient reactive balance stepping testing and grossly WNL with both EC and EO in today's session. Will continue to progress towards LTGs as EC on compliant is primarily greatest remaining deficit.   OBJECTIVE IMPAIRMENTS: Abnormal gait, decreased activity tolerance, decreased balance, difficulty walking, and dizziness.   ACTIVITY LIMITATIONS: locomotion level  PARTICIPATION LIMITATIONS: community activity, roller skating   PERSONAL FACTORS: Past/current experiences, Time since onset of injury/illness/exacerbation, and 3+ comorbidities: anemia, anxiety, asthma, depression, GERD, migraine, meningioma s/p resection 07/2009   are also affecting patient's functional outcome.   REHAB POTENTIAL: Good  CLINICAL DECISION MAKING: Stable/uncomplicated  EVALUATION COMPLEXITY: Low   PLAN:  PT FREQUENCY: 1-2x/week (Recommending  additional 1-2x week for 4 weeks)   PT DURATION: 4 weeks  PLANNED INTERVENTIONS: Therapeutic exercises, Therapeutic activity, Neuromuscular re-education, Balance training, Gait training, Patient/Family education, Self Care, Vestibular training, and Re-evaluation  PLAN FOR NEXT SESSION:  work on stepping reaction/compensation strategies particular when she loses her balance with less visual feedback, reduced visual support with EC and stepping strategy when loses balance progress VOR as able, work on eyes closed, head motions, tandems. High level balance for vestibular input, item pickups with balance tasks on compliant surfaces, work on dynamic on compliant surfaces with head turns  Carmelia Bake, PT, DPT  03/17/2023, 4:51 PM

## 2023-03-22 ENCOUNTER — Ambulatory Visit: Payer: BC Managed Care – PPO | Admitting: Physical Therapy

## 2023-03-22 ENCOUNTER — Encounter: Payer: Self-pay | Admitting: Physical Therapy

## 2023-03-22 VITALS — BP 105/59 | HR 83

## 2023-03-22 DIAGNOSIS — R2681 Unsteadiness on feet: Secondary | ICD-10-CM

## 2023-03-22 NOTE — Therapy (Unsigned)
OUTPATIENT PHYSICAL THERAPY VESTIBULAR TREATMENT      Patient Name: Deborah Ortega MRN: [id number] DOB:19-Aug-1965, 57 y.o., female Today's Date: 03/24/2023  END OF SESSION:    Past Medical History:  Diagnosis Date   ALLERGIC RHINITIS    Anemia    Anxiety    Asthma    being worked up for asthma now. Chronic cough   Depression    takes lexapro   GERD    History of kidney stones    HOH (hard of hearing)    both ears   MENINGIOMA    s/p resction 07/2009   MIGRAINE HEADACHE    PONV (postoperative nausea and vomiting)    RENAL CALCULUS, HX OF 06/25/2007, 02/2013   THYROID NODULE    Past Surgical History:  Procedure Laterality Date   ANKLE SURGERY Right 04/13/2018   BREAST BIOPSY Right 10/29/2013   BREAST LUMPECTOMY WITH RADIOACTIVE SEED LOCALIZATION Right 11/19/2013   Procedure: BREAST LUMPECTOMY WITH RADIOACTIVE SEED LOCALIZATION;  Surgeon: Ernestene Mention, MD;  Location: [facility];  Service: General;  Laterality: Right;   CESAREAN SECTION     x's 2   EXTRACORPOREAL SHOCK WAVE LITHOTRIPSY Left 12/17/2021   Procedure: LEFTEXTRACORPOREAL SHOCK WAVE LITHOTRIPSY (ESWL);  Surgeon: Jerilee Field, MD;  Location: [facility];  Service: Urology;  Laterality: Left;   Meninogioma Resection  07/25/09   pariatal area head-crainiotomy-plate   WISDOM TOOTH EXTRACTION     Patient Active Problem List   Diagnosis Date Noted   Menopause 01/07/2023   Abnormal cervical Papanicolaou smear 08/13/2022   Dysphonia 08/13/2022   Dizziness 10/02/2021   Sensorineural hearing loss (SNHL), bilateral 01/30/2019   Anemia 03/31/2017   History of brain tumor 03/31/2017   Chronic cough 09/15/2015   LPRD (laryngopharyngeal reflux disease) 09/15/2015   Routine general medical examination at a health care facility 12/15/2014   Intracranial tumor (HCC) 11/18/2014   Cough variant asthma 07/08/2014   Fibroadenoma of right breast 01/14/2014   THYROID NODULE 07/16/2009    Allergic rhinitis 07/15/2009   GERD 07/15/2009    PCP:  Myrlene Broker, MD REFERRING PROVIDER:  Myrlene Broker, MD  REFERRING DIAG: R42 (ICD-10-CM) - Dizziness   THERAPY DIAG:  Unsteadiness on feet  ONSET DATE: 01/07/2023  Rationale for Evaluation and Treatment: Rehabilitation  SUBJECTIVE:   SUBJECTIVE STATEMENT: Patient denies any falls, reports 1 near fall due to stumbling. Patient reports that looking in busy environment and turning head when walking made her feel very dizzy.   Pt accompanied by: self  PERTINENT HISTORY: PMH: anemia, anxiety, asthma, depression, GERD, migraine, meningioma s/p resection 07/2009   PAIN:  Are you having pain? No  PRECAUTIONS: None  WEIGHT BEARING RESTRICTIONS: No  FALLS: Has patient fallen in last 6 months? No  PLOF: Independent, Vocation/Vocational requirements: Retired , and Leisure: Health visitor, yoga   PATIENT GOALS: Wants to get to the root of why she is feeling the way she is, not feel swimmy headed.   OBJECTIVE:   DIAGNOSTIC FINDINGS: CT head 10/03/22  IMPRESSION: 1. No acute intracranial process. 2. 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 No evidence of recurrent mass or mass effect on this unenhanced exam  VESTIBULAR TREATMENT:  Vitals:   03/22/23 1540  BP: (!) 105/59  Pulse: 83   BP initially on low side; patient states she has felt mildly off today, unsure if lightheaded  TherAct: Initiated session on SciFit lvl 6 for increased arm/leg motion to see if could increase in BP x 5 min with assess/checking BP   Pre SciFit BP: 105/59 mmHg  Post SciFit BP: 111/58 mmHg  NMR:  VOR x 2 viewing with gait 6 x 30 feet  180 degrees pivot turns and stop with gaze stabilization x 10  180 degrees pivot turns and continue walking x 10    Ambulation around clinic with multidirectional looking (horizontal, vertical, diagonals)    PATIENT EDUCATION: Education details: Continue HEP Person educated: Patient Education method: Medical illustrator Education comprehension: verbalized understanding and returned demonstration  HOME EXERCISE PROGRAM: Gait with head turns to HEP Standing VOR x1 with feet together and busy background x30 seconds   Access Code: [email or web address] URL: [email or web address] Date: 03/07/2023 Prepared by: Sherlie Ban  Exercises - Wide Stance with Eyes Closed on Foam Pad  - 2 x daily - 5-7 x weekly - 3 sets - 30 hold - Romberg Stance on Foam Pad with Head Rotation  - 2 x daily - 5-7 x weekly - 2 sets - 10 reps - Tandem Walking with Counter Support  - 2 x daily - 5-7 x weekly - 3 sets - Romberg Stance with Eyes Closed  - 1 x daily - 5 x weekly - 2 sets - 10 reps - with head turns and head nods   GOALS: Goals reviewed with patient? Yes  SHORT TERM GOALS: ALL STGS = LTGS  LONG TERM GOALS: Target date: 03/28/2023 (Updated date given progress/remaining deficits noted in today's session)   Pt will be independent with final HEP for vestibular related deficits in order to build upon functional gains made in therapy. Baseline: Reports compliance thus far, continues to report improvements in exercises Goal status: IN PROGRESS  2.  Pt will improve FGA to at least a 26/30 in order to demo decr fall risk. Baseline: 23/30; 23/30 (improved NBOS tandem but increased difficulty on stairs)  Goal status: IN PROGRESS  3.   Pt will improve DVA to a 1 line difference or less in order to demo improved VOR  Baseline: 3 line difference; 2 line difference; continues to be at a 2 line difference  Goal status: IN PROGRESS  4.  Pt will improve condition 4 of mCTSIB to at least 15 seconds in order to demo improved vestibular input for balance.  Baseline: 2.5 seconds; improved to 5 seconds; 13 seconds   Goal status: REVISED   ASSESSMENT:  CLINICAL IMPRESSION:   Session initiated with BP assessment and scifit as patient BP was running on low end. Increased slightly but recommend patient continue to monitor at home. Initiated remainder of session with VOR x 2 viewing and busy background with multidirectional turning and "spotting" for compensation strategy when patient in supermarket. Patient reported improvement in symptoms with use of gaze stabilization strategy. Continue POC.    OBJECTIVE IMPAIRMENTS: Abnormal gait, decreased activity tolerance, decreased balance, difficulty walking, and dizziness.   ACTIVITY LIMITATIONS: locomotion level  PARTICIPATION LIMITATIONS: community activity, roller skating   PERSONAL FACTORS: Past/current experiences, Time since onset of injury/illness/exacerbation, and 3+ comorbidities: anemia, anxiety, asthma, depression, GERD, migraine, meningioma s/p resection 07/2009   are also affecting patient's functional outcome.   REHAB POTENTIAL: Good  CLINICAL DECISION MAKING: Stable/uncomplicated  EVALUATION COMPLEXITY:  Low   PLAN:  PT FREQUENCY: 1-2x/week (Recommending additional 1-2x week for 4 weeks)   PT DURATION: 4 weeks  PLANNED INTERVENTIONS: Therapeutic exercises, Therapeutic activity, Neuromuscular re-education, Balance training, Gait training, Patient/Family education, Self Care, Vestibular training, and Re-evaluation  PLAN FOR NEXT SESSION:  work on stepping reaction/compensation strategies particular when she loses her balance with less visual feedback, reduced visual support with EC and stepping strategy when loses balance  progress VOR as able, work on eyes closed, head motions, tandems. High level balance for vestibular input, item pickups with balance tasks on compliant surfaces, work on dynamic on compliant surfaces with head turns  Carmelia Bake, PT, DPT  03/24/2023, 8:47 AM

## 2023-03-24 ENCOUNTER — Ambulatory Visit: Payer: BC Managed Care – PPO | Admitting: Endocrinology

## 2023-03-24 ENCOUNTER — Ambulatory Visit: Payer: BC Managed Care – PPO | Admitting: Physical Therapy

## 2023-03-24 ENCOUNTER — Encounter: Payer: Self-pay | Admitting: Physical Therapy

## 2023-03-29 ENCOUNTER — Ambulatory Visit: Payer: BC Managed Care – PPO | Admitting: Physical Therapy

## 2023-03-29 ENCOUNTER — Encounter: Payer: Self-pay | Admitting: Physical Therapy

## 2023-03-29 VITALS — BP 108/72 | HR 71

## 2023-03-29 DIAGNOSIS — R2681 Unsteadiness on feet: Secondary | ICD-10-CM

## 2023-03-29 NOTE — Therapy (Signed)
OUTPATIENT PHYSICAL THERAPY VESTIBULAR TREATMENT      Patient Name: LEDRA BECERRIL MRN: [id number] DOB:[birth year], 57 y.o., female Today's Date: 03/29/2023  END OF SESSION:  PT End of Session - 03/29/23 1151     Visit Number 13    Number of Visits 14    Date for PT Re-Evaluation 04/11/23    Authorization Type [employer]    PT Start Time 1150    PT Stop Time 1229    PT Time Calculation (min) 39 min    Equipment Utilized During Treatment Gait belt    Activity Tolerance Patient tolerated treatment well    Behavior During Therapy WFL for tasks assessed/performed              Past Medical History:  Diagnosis Date   ALLERGIC RHINITIS    Anemia    Anxiety    Asthma    being worked up for asthma now. Chronic cough   Depression    takes lexapro   GERD    History of kidney stones    HOH (hard of hearing)    both ears   MENINGIOMA    s/p resction 07/2009   MIGRAINE HEADACHE    PONV (postoperative nausea and vomiting)    RENAL CALCULUS, HX OF 06/25/2007, 02/2013   THYROID NODULE    Past Surgical History:  Procedure Laterality Date   ANKLE SURGERY Right 04/13/2018   BREAST BIOPSY Right 10/29/2013   BREAST LUMPECTOMY WITH RADIOACTIVE SEED LOCALIZATION Right 11/19/2013   Procedure: BREAST LUMPECTOMY WITH RADIOACTIVE SEED LOCALIZATION;  Surgeon: Ernestene Mention, MD;  Location: [facility];  Service: General;  Laterality: Right;   CESAREAN SECTION     x's 2   EXTRACORPOREAL SHOCK WAVE LITHOTRIPSY Left 12/17/2021   Procedure: LEFTEXTRACORPOREAL SHOCK WAVE LITHOTRIPSY (ESWL);  Surgeon: Jerilee Field, MD;  Location: [facility];  Service: Urology;  Laterality: Left;   Meninogioma Resection  07/25/09   pariatal area head-crainiotomy-plate   WISDOM TOOTH EXTRACTION     Patient Active Problem List   Diagnosis Date Noted   Menopause 01/07/2023   Abnormal cervical Papanicolaou smear 08/13/2022   Dysphonia 08/13/2022   Dizziness 10/02/2021    Sensorineural hearing loss (SNHL), bilateral 01/30/2019   Anemia 03/31/2017   History of brain tumor 03/31/2017   Chronic cough 09/15/2015   LPRD (laryngopharyngeal reflux disease) 09/15/2015   Routine general medical examination at a health care facility 12/15/2014   Intracranial tumor (HCC) 11/18/2014   Cough variant asthma 07/08/2014   Fibroadenoma of right breast 01/14/2014   THYROID NODULE 07/16/2009   Allergic rhinitis 07/15/2009   GERD 07/15/2009    PCP:  Myrlene Broker, MD REFERRING PROVIDER:  Myrlene Broker, MD  REFERRING DIAG: R42 (ICD-10-CM) - Dizziness   THERAPY DIAG:  Unsteadiness on feet  ONSET DATE: 01/07/2023  Rationale for Evaluation and Treatment: Rehabilitation  SUBJECTIVE:   SUBJECTIVE STATEMENT: Reports nothing new. No falls or almost falls. Sees some minor improvements, but nothing drastic. Still feeling the swimmy headed feeling.   Pt accompanied by: self  PERTINENT HISTORY: PMH: anemia, anxiety, asthma, depression, GERD, migraine, meningioma s/p resection 07/2009   PAIN:  Are you having pain? No  PRECAUTIONS: None  WEIGHT BEARING RESTRICTIONS: No  FALLS: Has patient fallen in last 6 months? No  PLOF: Independent, Vocation/Vocational requirements: Retired , and Leisure: Health visitor, yoga   PATIENT GOALS: Wants to get to the root of why she is feeling the way she is,  not feel swimmy headed.   OBJECTIVE:   DIAGNOSTIC FINDINGS: CT head 10/03/22  IMPRESSION: 1. No acute intracranial process. 2. 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 No evidence of recurrent mass or mass effect on this unenhanced exam  VESTIBULAR TREATMENT:                                                                                                    Vitals:   03/29/23 1155  BP: 108/72  Pulse: 71    TherAct:   [facility] PT Assessment - 03/29/23 1153       Functional Gait   Assessment   Gait assessed  Yes    Gait Level Surface Walks 20 ft in less than 5.5 sec, no assistive devices, good speed, no evidence for imbalance, normal gait pattern, deviates no more than 6 in outside of the 12 in walkway width.    Change in Gait Speed Able to smoothly change walking speed without loss of balance or gait deviation. Deviate no more than 6 in outside of the 12 in walkway width.    Gait with Horizontal Head Turns Performs head turns smoothly with no change in gait. Deviates no more than 6 in outside 12 in walkway width    Gait with Vertical Head Turns Performs head turns with no change in gait. Deviates no more than 6 in outside 12 in walkway width.    Gait and Pivot Turn Pivot turns safely within 3 sec and stops quickly with no loss of balance.    Step Over Obstacle Is able to step over 2 stacked shoe boxes taped together (9 in total height) without changing gait speed. No evidence of imbalance.    Gait with Narrow Base of Support Is able to ambulate for 10 steps heel to toe with no staggering.    Gait with Eyes Closed Cannot walk 20 ft without assistance, severe gait deviations or imbalance, deviates greater than 15 in outside 12 in walkway width or will not attempt task.   16.5 seconds   Ambulating Backwards Walks 20 ft, no assistive devices, good speed, no evidence for imbalance, normal gait    Steps Alternating feet, no rail.    Total Score 27    FGA comment: 27/30, Low Fall Risk             NMR: On air ex: Sit <> stands with EC feet hip width 2 x 5 reps, with feet closer together x5 reps (pt with min guard with this), pt appears more balance with feet hip width when trying this today  Bending over with busy background on floor for visual challenge and picking up cone with feet hip width > together 3 x 5 reps, pt with no issues with this  Feet hip width EC: x30 seconds, x10 reps head turns, x10 reps nods, x10 reps diagonals each direction - only needing intermittent UE  support with diagonals  On level ground: 3/4 tandem: holding ball and tossing vertical x10 reps and then to R/L for diagonals x10 reps, performed each  with each leg posteriorly, tandem with vertical ball toss x10 reps - pt more challenged with full tandem     PATIENT EDUCATION: Education details: Results of FGA, plan to D/C at end of current scheduled appts with pt in agreement with plan, pt may benefit from ENT appt as pt reports continued swimmy headedness Person educated: Patient Education method: Explanation and Demonstration Education comprehension: verbalized understanding and returned demonstration  HOME EXERCISE PROGRAM: Gait with head turns to HEP Standing VOR x1 with feet together and busy background x30 seconds   Access Code: [email or web address] URL: [email or web address] Date: 03/07/2023 Prepared by: Sherlie Ban  Exercises - Wide Stance with Eyes Closed on Foam Pad  - 2 x daily - 5-7 x weekly - 3 sets - 30 hold - Romberg Stance on Foam Pad with Head Rotation  - 2 x daily - 5-7 x weekly - 2 sets - 10 reps - Tandem Walking with Counter Support  - 2 x daily - 5-7 x weekly - 3 sets - Romberg Stance with Eyes Closed  - 1 x daily - 5 x weekly - 2 sets - 10 reps - with head turns and head nods   GOALS: Goals reviewed with patient? Yes  SHORT TERM GOALS: ALL STGS = LTGS  LONG TERM GOALS: Target date:  Updated goal date for POC: 04/11/23    Pt will be independent with final HEP for vestibular related deficits in order to build upon functional gains made in therapy. Baseline: Reports compliance thus far, continues to report improvements in exercises Goal status: IN PROGRESS  2.  Pt will improve FGA to at least a 26/30 in order to demo decr fall risk. Baseline: 23/30; 23/30 (improved NBOS tandem but increased difficulty on stairs); 27/30 on 8/13  Goal status: MET  3.   Pt will improve DVA to a 1 line difference or less in order to demo improved VOR  Baseline: 3 line  difference; 2 line difference; continues to be at a 2 line difference  Goal status: IN PROGRESS  4.  Pt will improve condition 4 of mCTSIB to at least 15 seconds in order to demo improved vestibular input for balance.  Baseline: 2.5 seconds; improved to 5 seconds; 13 seconds  Goal status: REVISED   ASSESSMENT:  CLINICAL IMPRESSION:   Assessed FGA today with pt scoring a 27/30, indicating a low fall risk and has met LTG #2. Previously pt scored a 23/30. Pt with improvements with head motions and retro gait. Pt continues to have the most difficulty with EC tasks. Remainder of session focused on balance tasks with EC, narrow BOS, and busy backgrounds for visual input. Pt reporting feeling better balance with EC today. Continue POC.    OBJECTIVE IMPAIRMENTS: Abnormal gait, decreased activity tolerance, decreased balance, difficulty walking, and dizziness.   ACTIVITY LIMITATIONS: locomotion level  PARTICIPATION LIMITATIONS: community activity, roller skating   PERSONAL FACTORS: Past/current experiences, Time since onset of injury/illness/exacerbation, and 3+ comorbidities: anemia, anxiety, asthma, depression, GERD, migraine, meningioma s/p resection 07/2009   are also affecting patient's functional outcome.   REHAB POTENTIAL: Good  CLINICAL DECISION MAKING: Stable/uncomplicated  EVALUATION COMPLEXITY: Low   PLAN:  PT FREQUENCY: 1-2x/week (Recommending additional 1-2x week for 4 weeks)   PT DURATION: 4 weeks  PLANNED INTERVENTIONS: Therapeutic exercises, Therapeutic activity, Neuromuscular re-education, Balance training, Gait training, Patient/Family education, Self Care, Vestibular training, and Re-evaluation  PLAN FOR NEXT SESSION: plan to D/C at end of POC   progress VOR as able, work on  eyes closed, head motions, tandems. High level balance for vestibular input, item pickups with balance tasks on compliant surfaces, work on dynamic on compliant surfaces with head turns  Drake Leach, PT, DPT  03/29/2023, 12:53 PM

## 2023-03-31 ENCOUNTER — Encounter: Payer: Self-pay | Admitting: Physical Therapy

## 2023-03-31 ENCOUNTER — Ambulatory Visit: Payer: BC Managed Care – PPO | Admitting: Physical Therapy

## 2023-03-31 DIAGNOSIS — R2681 Unsteadiness on feet: Secondary | ICD-10-CM

## 2023-03-31 NOTE — Therapy (Signed)
OUTPATIENT PHYSICAL THERAPY VESTIBULAR TREATMENT      Patient Name: Deborah Ortega MRN: [id number] DOB:11/30/1965, 57 y.o., female Today's Date: 03/31/2023  END OF SESSION:  PT End of Session - 03/31/23 1148     Visit Number 14    Number of Visits 16    Date for PT Re-Evaluation 04/11/23    Authorization Type BCBS    PT Start Time 1147    PT Stop Time 1227    PT Time Calculation (min) 40 min    Equipment Utilized During Treatment Gait belt    Activity Tolerance Patient tolerated treatment well    Behavior During Therapy WFL for tasks assessed/performed              Past Medical History:  Diagnosis Date   ALLERGIC RHINITIS    Anemia    Anxiety    Asthma    being worked up for asthma now. Chronic cough   Depression    takes lexapro   GERD    History of kidney stones    HOH (hard of hearing)    both ears   MENINGIOMA    s/p resction 07/2009   MIGRAINE HEADACHE    PONV (postoperative nausea and vomiting)    RENAL CALCULUS, HX OF 06/25/2007, 02/2013   THYROID NODULE    Past Surgical History:  Procedure Laterality Date   ANKLE SURGERY Right 04/13/2018   BREAST BIOPSY Right 10/29/2013   BREAST LUMPECTOMY WITH RADIOACTIVE SEED LOCALIZATION Right 11/19/2013   Procedure: BREAST LUMPECTOMY WITH RADIOACTIVE SEED LOCALIZATION;  Surgeon: Ernestene Mention, MD;  Location: [facility];  Service: General;  Laterality: Right;   CESAREAN SECTION     x's 2   EXTRACORPOREAL SHOCK WAVE LITHOTRIPSY Left 12/17/2021   Procedure: LEFTEXTRACORPOREAL SHOCK WAVE LITHOTRIPSY (ESWL);  Surgeon: Jerilee Field, MD;  Location: [facility];  Service: Urology;  Laterality: Left;   Meninogioma Resection  07/25/09   pariatal area head-crainiotomy-plate   WISDOM TOOTH EXTRACTION     Patient Active Problem List   Diagnosis Date Noted   Menopause 01/07/2023   Abnormal cervical Papanicolaou smear 08/13/2022   Dysphonia 08/13/2022   Dizziness 10/02/2021    Sensorineural hearing loss (SNHL), bilateral 01/30/2019   Anemia 03/31/2017   History of brain tumor 03/31/2017   Chronic cough 09/15/2015   LPRD (laryngopharyngeal reflux disease) 09/15/2015   Routine general medical examination at a health care facility 12/15/2014   Intracranial tumor (HCC) 11/18/2014   Cough variant asthma 07/08/2014   Fibroadenoma of right breast 01/14/2014   THYROID NODULE 07/16/2009   Allergic rhinitis 07/15/2009   GERD 07/15/2009    PCP:  Myrlene Broker, MD REFERRING PROVIDER:  Myrlene Broker, MD  REFERRING DIAG: R42 (ICD-10-CM) - Dizziness   THERAPY DIAG:  Unsteadiness on feet  ONSET DATE: 01/07/2023  Rationale for Evaluation and Treatment: Rehabilitation  SUBJECTIVE:   SUBJECTIVE STATEMENT: Nothing new since the other day. Wants to work on some more of the balance with being in the dark, as that is definitely the most challenging.   Pt accompanied by: self  PERTINENT HISTORY: PMH: anemia, anxiety, asthma, depression, GERD, migraine, meningioma s/p resection 07/2009   PAIN:  Are you having pain? No  PRECAUTIONS: None  WEIGHT BEARING RESTRICTIONS: No  FALLS: Has patient fallen in last 6 months? No  PLOF: Independent, Vocation/Vocational requirements: Retired , and Leisure: Health visitor, yoga   PATIENT GOALS: Wants to get to the root of why she  is feeling the way she is, not feel swimmy headed.   OBJECTIVE:   DIAGNOSTIC FINDINGS: CT head 10/03/22  IMPRESSION: 1. No acute intracranial process. 2. 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 No evidence of recurrent mass or mass effect on this unenhanced exam  VESTIBULAR TREATMENT:                                                                                                     NMR: On blue mat:  Side stepping down and back x3 reps with EC Forward walking down and back x4 reps with EC EC feet  together 2 x 30 seconds Pt needing intermittent taps for balance, esp with gait  On rockerboard in A/P direction: Alternating steps up with marching for dynamic SLS x10 reps, adding in head turns/trunk rotations x10 reps  EO: Diagonal head motions x10 reps each direction EC 3 x 20 seconds, pt needing frequent UE support due to almost losing balance in forwards/backwards direction With use of rebounder on level ground, 3/4 tandem > full tandem stance bilat and tossing ball to rebounder and catching, performed approx. 20 catches each leg, pt did well keeping balance in tandem stance.  Modified SLS with one leg on green balance disc and one on floor, x10 reps head turns and x10 reps head nods with each leg, pt able to primarily perform with no UE support  Standing up incline with EC: Wide BOS > Feet Hip Width > Together 3 x 10 reps head turns, 3 x 10 reps head nods, pt with incr postural sway with feet together, and needing to open eyes intermittently     PATIENT EDUCATION: Education details: Continue HEP Person educated: Patient Education method: Medical illustrator Education comprehension: verbalized understanding and returned demonstration  HOME EXERCISE PROGRAM: Gait with head turns to HEP Standing VOR x1 with feet together and busy background x30 seconds  Can try walking with EC down hallway or counter  Access Code: [email or web address] URL: [email or web address] Date: 03/07/2023 Prepared by: Sherlie Ban  Exercises - Wide Stance with Eyes Closed on Foam Pad  - 2 x daily - 5-7 x weekly - 3 sets - 30 hold - Romberg Stance on Foam Pad with Head Rotation  - 2 x daily - 5-7 x weekly - 2 sets - 10 reps - Tandem Walking with Counter Support  - 2 x daily - 5-7 x weekly - 3 sets - Romberg Stance with Eyes Closed  - 1 x daily - 5 x weekly - 2 sets - 10 reps - with head turns and head nods   GOALS: Goals reviewed with patient? Yes  SHORT TERM GOALS: ALL STGS = LTGS  LONG  TERM GOALS: Target date:  Updated goal date for POC: 04/11/23    Pt will be independent with final HEP for vestibular related deficits in order to build upon functional gains made in therapy. Baseline: Reports compliance thus far, continues to report improvements in exercises Goal status: IN PROGRESS  2.  Pt will improve FGA to at least a  26/30 in order to demo decr fall risk. Baseline: 23/30; 23/30 (improved NBOS tandem but increased difficulty on stairs); 27/30 on 8/13  Goal status: MET  3.   Pt will improve DVA to a 1 line difference or less in order to demo improved VOR  Baseline: 3 line difference; 2 line difference; continues to be at a 2 line difference  Goal status: IN PROGRESS  4.  Pt will improve condition 4 of mCTSIB to at least 15 seconds in order to demo improved vestibular input for balance.  Baseline: 2.5 seconds; improved to 5 seconds; 13 seconds  Goal status: REVISED   ASSESSMENT:  CLINICAL IMPRESSION:   Today's skilled session focused on balance strategies with EC, narrow BOS, and head motions. Pt continues to be most challenged with balance tasks with EC. Pt demonstrating improvement in narrow BOS and tandem stance tasks for balance. Continue POC.    OBJECTIVE IMPAIRMENTS: Abnormal gait, decreased activity tolerance, decreased balance, difficulty walking, and dizziness.   ACTIVITY LIMITATIONS: locomotion level  PARTICIPATION LIMITATIONS: community activity, roller skating   PERSONAL FACTORS: Past/current experiences, Time since onset of injury/illness/exacerbation, and 3+ comorbidities: anemia, anxiety, asthma, depression, GERD, migraine, meningioma s/p resection 07/2009   are also affecting patient's functional outcome.   REHAB POTENTIAL: Good  CLINICAL DECISION MAKING: Stable/uncomplicated  EVALUATION COMPLEXITY: Low   PLAN:  PT FREQUENCY: 1-2x/week (Recommending additional 1-2x week for 4 weeks)   PT DURATION: 4 weeks  PLANNED INTERVENTIONS:  Therapeutic exercises, Therapeutic activity, Neuromuscular re-education, Balance training, Gait training, Patient/Family education, Self Care, Vestibular training, and Re-evaluation  PLAN FOR NEXT SESSION: plan to D/C at end of POC   progress VOR as able, work on eyes closed, head motions, tandems. High level balance for vestibular input, item pickups with balance tasks on compliant surfaces, work on dynamic on compliant surfaces with head turns  Drake Leach, PT, DPT  03/31/2023, 1:02 PM

## 2023-04-05 ENCOUNTER — Encounter: Payer: Self-pay | Admitting: Physical Therapy

## 2023-04-05 ENCOUNTER — Ambulatory Visit: Payer: BC Managed Care – PPO | Admitting: Physical Therapy

## 2023-04-05 DIAGNOSIS — R2681 Unsteadiness on feet: Secondary | ICD-10-CM | POA: Diagnosis not present

## 2023-04-05 NOTE — Therapy (Signed)
OUTPATIENT PHYSICAL THERAPY VESTIBULAR TREATMENT      Patient Name: Deborah Ortega MRN: [id number] DOB:16-Aug-1966, 57 y.o., female Today's Date: 04/05/2023  END OF SESSION:  PT End of Session - 04/05/23 1149     Visit Number 15    Number of Visits 16    Date for PT Re-Evaluation 04/11/23    Authorization Type BCBS    PT Start Time 1148    PT Stop Time 1229    PT Time Calculation (min) 41 min    Equipment Utilized During Treatment Gait belt    Activity Tolerance Patient tolerated treatment well    Behavior During Therapy WFL for tasks assessed/performed              Past Medical History:  Diagnosis Date   ALLERGIC RHINITIS    Anemia    Anxiety    Asthma    being worked up for asthma now. Chronic cough   Depression    takes lexapro   GERD    History of kidney stones    HOH (hard of hearing)    both ears   MENINGIOMA    s/p resction 07/2009   MIGRAINE HEADACHE    PONV (postoperative nausea and vomiting)    RENAL CALCULUS, HX OF 06/25/2007, 02/2013   THYROID NODULE    Past Surgical History:  Procedure Laterality Date   ANKLE SURGERY Right 04/13/2018   BREAST BIOPSY Right 10/29/2013   BREAST LUMPECTOMY WITH RADIOACTIVE SEED LOCALIZATION Right 11/19/2013   Procedure: BREAST LUMPECTOMY WITH RADIOACTIVE SEED LOCALIZATION;  Surgeon: Ernestene Mention, MD;  Location: [facility];  Service: General;  Laterality: Right;   CESAREAN SECTION     x's 2   EXTRACORPOREAL SHOCK WAVE LITHOTRIPSY Left 12/17/2021   Procedure: LEFTEXTRACORPOREAL SHOCK WAVE LITHOTRIPSY (ESWL);  Surgeon: Jerilee Field, MD;  Location: [facility];  Service: Urology;  Laterality: Left;   Meninogioma Resection  07/25/09   pariatal area head-crainiotomy-plate   WISDOM TOOTH EXTRACTION     Patient Active Problem List   Diagnosis Date Noted   Menopause 01/07/2023   Abnormal cervical Papanicolaou smear 08/13/2022   Dysphonia 08/13/2022   Dizziness 10/02/2021    Sensorineural hearing loss (SNHL), bilateral 01/30/2019   Anemia 03/31/2017   History of brain tumor 03/31/2017   Chronic cough 09/15/2015   LPRD (laryngopharyngeal reflux disease) 09/15/2015   Routine general medical examination at a health care facility 12/15/2014   Intracranial tumor (HCC) 11/18/2014   Cough variant asthma 07/08/2014   Fibroadenoma of right breast 01/14/2014   THYROID NODULE 07/16/2009   Allergic rhinitis 07/15/2009   GERD 07/15/2009    PCP:  Myrlene Broker, MD REFERRING PROVIDER:  Myrlene Broker, MD  REFERRING DIAG: R42 (ICD-10-CM) - Dizziness   THERAPY DIAG:  Unsteadiness on feet  ONSET DATE: 01/07/2023  Rationale for Evaluation and Treatment: Rehabilitation  SUBJECTIVE:   SUBJECTIVE STATEMENT: No changes since she was last here. Wants to keep working on exercises in the dark.   Pt accompanied by: self  PERTINENT HISTORY: PMH: anemia, anxiety, asthma, depression, GERD, migraine, meningioma s/p resection 07/2009   PAIN:  Are you having pain? No  PRECAUTIONS: None  WEIGHT BEARING RESTRICTIONS: No  FALLS: Has patient fallen in last 6 months? No  PLOF: Independent, Vocation/Vocational requirements: Retired , and Leisure: Health visitor, yoga   PATIENT GOALS: Wants to get to the root of why she is feeling the way she is, not feel swimmy headed.  OBJECTIVE:   DIAGNOSTIC FINDINGS: CT head 10/03/22  IMPRESSION: 1. No acute intracranial process. 2. 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 No evidence of recurrent mass or mass effect on this unenhanced exam  VESTIBULAR TREATMENT:                                                                                                     NMR:  Dynamic Visual Acuity: Static: Line 9 Dynamic: Line 7 Overall: 2 Line Difference (WNL), pt reporting no symptoms with this    On air ex: Narrow BOS EC 4 x 30 seconds,  incr use of ankle/hip strategy for balance, able to perform with decr UE support  With feet hip width EC 2 x 10 reps diagonals each direction, more unsteady with looking up to R first  Wall bumps with EC 3 x 5 reps, more unsteady with pt coming away from wall  1/2 narrow tandem stance with 4# medicine ball toss on ground and catch with PT 2 x 10 reps with each leg posteriorly, pt using ankle strategy for balance With 6 blaze pods on mirror in superior, inferior, and lateral directions, standing on air ex with feet together, for visual scanning, head motions, reaching outside of BOS, performed on random setting  Performed 2 bouts of 1 minute each: 28 hits, 29 hits, 28 hits  Pt reporting feel a little bit off afterwards, feeling like the same way she does in a grocery store  Pt more challenged reaching up I nthe superior direction     PATIENT EDUCATION: Education details: Discussed plan for D/C at next session, pt asking about return to exercise and classes after D/C, discussed going back to beginners yoga and different varieties of classes at the [facility] Person educated: Patient Education method: Medical illustrator Education comprehension: verbalized understanding and returned demonstration  HOME EXERCISE PROGRAM: Gait with head turns to HEP Standing VOR x1 with feet together and busy background x30 seconds  Can try walking with EC down hallway or counter  Access Code: [email or web address] URL: [email or web address] Date: 03/07/2023 Prepared by: Sherlie Ban  Exercises - Wide Stance with Eyes Closed on Foam Pad  - 2 x daily - 5-7 x weekly - 3 sets - 30 hold - Romberg Stance on Foam Pad with Head Rotation  - 2 x daily - 5-7 x weekly - 2 sets - 10 reps - Tandem Walking with Counter Support  - 2 x daily - 5-7 x weekly - 3 sets - Romberg Stance with Eyes Closed  - 1 x daily - 5 x weekly - 2 sets - 10 reps - with head turns and head nods   GOALS: Goals reviewed with patient?  Yes  SHORT TERM GOALS: ALL STGS = LTGS  LONG TERM GOALS: Target date:  Updated goal date for POC: 04/11/23    Pt will be independent with final HEP for vestibular related deficits in order to build upon functional gains made in therapy. Baseline: Reports compliance thus far, continues to report improvements in exercises Goal status: IN PROGRESS  2.  Pt will improve FGA to at least a 26/30 in order to demo decr fall risk. Baseline: 23/30; 23/30 (improved NBOS tandem but increased difficulty on stairs); 27/30 on 8/13  Goal status: MET  3.   Pt will improve DVA to a 1 line difference or less in order to demo improved VOR  Baseline: 3 line difference; 2 line difference; continues to be at a 2 line difference   2 line difference (8/20) Goal status: NOT MET  4.  Pt will improve condition 4 of mCTSIB to at least 15 seconds in order to demo improved vestibular input for balance.  Baseline: 2.5 seconds; improved to 5 seconds; 13 seconds  Goal status: REVISED   ASSESSMENT:  CLINICAL IMPRESSION:   Assessed DVA today with pt continuing to have a 2 line difference, with pt with no symptoms afterwards. Today's skilled session focused on balance strategies with EC, and narrow BOS. Also worked on [employer] for visual scanning with a narrow BOS. Pt challenged from this exercise from a visual perspective and reports this is similar to the way she feels at the grocery store. Anticipate D/C at next session with pt in agreement with plan.    OBJECTIVE IMPAIRMENTS: Abnormal gait, decreased activity tolerance, decreased balance, difficulty walking, and dizziness.   ACTIVITY LIMITATIONS: locomotion level  PARTICIPATION LIMITATIONS: community activity, roller skating   PERSONAL FACTORS: Past/current experiences, Time since onset of injury/illness/exacerbation, and 3+ comorbidities: anemia, anxiety, asthma, depression, GERD, migraine, meningioma s/p resection 07/2009   are also affecting patient's  functional outcome.   REHAB POTENTIAL: Good  CLINICAL DECISION MAKING: Stable/uncomplicated  EVALUATION COMPLEXITY: Low   PLAN:  PT FREQUENCY: 1-2x/week (Recommending additional 1-2x week for 4 weeks)   PT DURATION: 4 weeks  PLANNED INTERVENTIONS: Therapeutic exercises, Therapeutic activity, Neuromuscular re-education, Balance training, Gait training, Patient/Family education, Self Care, Vestibular training, and Re-evaluation  PLAN FOR NEXT SESSION: check goals,finalize HEP, plan to D/C   Drake Leach, PT, DPT  04/05/2023, 12:35 PM

## 2023-04-06 ENCOUNTER — Telehealth: Payer: Self-pay | Admitting: Internal Medicine

## 2023-04-06 DIAGNOSIS — Z87898 Personal history of other specified conditions: Secondary | ICD-10-CM

## 2023-04-06 DIAGNOSIS — H903 Sensorineural hearing loss, bilateral: Secondary | ICD-10-CM

## 2023-04-06 DIAGNOSIS — R42 Dizziness and giddiness: Secondary | ICD-10-CM

## 2023-04-06 NOTE — Telephone Encounter (Signed)
Pt called stating that the physical therapist recommended that she see a ENT but she need a referral. Please advise.

## 2023-04-07 ENCOUNTER — Ambulatory Visit: Payer: BC Managed Care – PPO | Admitting: Physical Therapy

## 2023-04-07 ENCOUNTER — Encounter: Payer: Self-pay | Admitting: Physical Therapy

## 2023-04-07 VITALS — BP 104/75 | HR 79

## 2023-04-07 DIAGNOSIS — R2681 Unsteadiness on feet: Secondary | ICD-10-CM

## 2023-04-07 NOTE — Therapy (Signed)
OUTPATIENT PHYSICAL THERAPY VESTIBULAR TREATMENT/DISCHARGE SUMMARY     Patient Name: Deborah Ortega MRN: [id number] DOB:10/18/1965, 57 y.o., female Today's Date: 04/07/2023  END OF SESSION:  PT End of Session - 04/07/23 1147     Visit Number 16    Number of Visits 16    Date for PT Re-Evaluation 04/11/23    Authorization Type BCBS    PT Start Time 1146    PT Stop Time 1210   full time not used due to D/C visit   PT Time Calculation (min) 24 min    Equipment Utilized During Treatment Gait belt    Activity Tolerance Patient tolerated treatment well    Behavior During Therapy WFL for tasks assessed/performed              Past Medical History:  Diagnosis Date   ALLERGIC RHINITIS    Anemia    Anxiety    Asthma    being worked up for asthma now. Chronic cough   Depression    takes lexapro   GERD    History of kidney stones    HOH (hard of hearing)    both ears   MENINGIOMA    s/p resction 07/2009   MIGRAINE HEADACHE    PONV (postoperative nausea and vomiting)    RENAL CALCULUS, HX OF 06/25/2007, 02/2013   THYROID NODULE    Past Surgical History:  Procedure Laterality Date   ANKLE SURGERY Right 04/13/2018   BREAST BIOPSY Right 10/29/2013   BREAST LUMPECTOMY WITH RADIOACTIVE SEED LOCALIZATION Right 11/19/2013   Procedure: BREAST LUMPECTOMY WITH RADIOACTIVE SEED LOCALIZATION;  Surgeon: Ernestene Mention, MD;  Location: [facility];  Service: General;  Laterality: Right;   CESAREAN SECTION     x's 2   EXTRACORPOREAL SHOCK WAVE LITHOTRIPSY Left 12/17/2021   Procedure: LEFTEXTRACORPOREAL SHOCK WAVE LITHOTRIPSY (ESWL);  Surgeon: Jerilee Field, MD;  Location: [facility];  Service: Urology;  Laterality: Left;   Meninogioma Resection  07/25/09   pariatal area head-crainiotomy-plate   WISDOM TOOTH EXTRACTION     Patient Active Problem List   Diagnosis Date Noted   Menopause 01/07/2023   Abnormal cervical Papanicolaou smear 08/13/2022    Dysphonia 08/13/2022   Dizziness 10/02/2021   Sensorineural hearing loss (SNHL), bilateral 01/30/2019   Anemia 03/31/2017   History of brain tumor 03/31/2017   Chronic cough 09/15/2015   LPRD (laryngopharyngeal reflux disease) 09/15/2015   Routine general medical examination at a health care facility 12/15/2014   Intracranial tumor (HCC) 11/18/2014   Cough variant asthma 07/08/2014   Fibroadenoma of right breast 01/14/2014   THYROID NODULE 07/16/2009   Allergic rhinitis 07/15/2009   GERD 07/15/2009    PCP:  Myrlene Broker, MD REFERRING PROVIDER:  Myrlene Broker, MD  REFERRING DIAG: R42 (ICD-10-CM) - Dizziness   THERAPY DIAG:  Unsteadiness on feet  ONSET DATE: 01/07/2023  Rationale for Evaluation and Treatment: Rehabilitation  SUBJECTIVE:   SUBJECTIVE STATEMENT: Felt like her sugars were low yesterday and felt a little off. Ate some chocolate covered nuts and that made her feel better. Still feeling a little off today with her sugars and got a sweet tea.   Pt accompanied by: self  PERTINENT HISTORY: PMH: anemia, anxiety, asthma, depression, GERD, migraine, meningioma s/p resection 07/2009   PAIN:  Are you having pain? No  Vitals:   04/07/23 1149  BP: 104/75  Pulse: 79     PRECAUTIONS: None  WEIGHT BEARING RESTRICTIONS: No  FALLS:  Has patient fallen in last 6 months? No  PLOF: Independent, Vocation/Vocational requirements: Retired , and Leisure: Health visitor, yoga   PATIENT GOALS: Wants to get to the root of why she is feeling the way she is, not feel swimmy headed.   OBJECTIVE:   DIAGNOSTIC FINDINGS: CT head 10/03/22  IMPRESSION: 1. No acute intracranial process. 2. 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 No evidence of recurrent mass or mass effect on this unenhanced exam  VESTIBULAR TREATMENT:                                                                                                        M-CTSIB  Condition 1: Firm Surface, EO 30 Sec, Normal Sway  Condition 2: Firm Surface, EC 30 Sec, Mild/Mod Sway  Condition 3: Foam Surface, EO 30 Sec, Mild Sway  Condition 4: Foam Surface, EC 4 Sec     Reviewed entirety of HEP below for balance/vestibular input for balance. Pt with no further questions at this time. See HEP section below and MedBridge for more details   PATIENT EDUCATION: Education details: D/C from therapy, continue HEP, pt to follow up with ENT regarding continued balance deficits esp with eyes closed/vestibular system  Person educated: Patient Education method: Explanation and Demonstration Education comprehension: verbalized understanding and returned demonstration  HOME EXERCISE PROGRAM:  Standing VOR x1 with feet together and busy background on an unlevel surface x30 seconds   Access Code: [email or web address] URL: [email or web address] Date: 04/07/2023 Prepared by: Sherlie Ban  Exercises - Tandem Walking with Counter Support  - 2 x daily - 5-7 x weekly - 3 sets - Romberg Stance with Eyes Closed  - 1 x daily - 5 x weekly - 2 sets - 10 reps - on level ground x10 reps head turns, x10 reps head nods  - Standing Balance with Eyes Closed on Foam  - 1 x daily - 5 x weekly - 3 sets - 30 hold - Wide Stance with Eyes Closed and Head Nods on Foam Pad  - 1 x daily - 5 x weekly - 2 sets - 10 reps and 10 reps head turns  - Walking with Head Rotation  - 1 x daily - 5 x weekly - 3 sets - Walking with Eyes Closed and Counter Support  - 1 x daily - 5 x weekly - 3 sets  PHYSICAL THERAPY DISCHARGE SUMMARY  Visits from Start of Care: 16  Current functional level related to goals / functional outcomes: See LTGs/Clinical Assessment Statement    Remaining deficits: Impaired vestibular input for balance with EC, narrow BOS    Education / Equipment: HEP   Patient agrees to discharge. Patient goals were not met. Patient is being  discharged due to maximized rehab potential.    GOALS: Goals reviewed with patient? Yes  SHORT TERM GOALS: ALL STGS = LTGS  LONG TERM GOALS: Target date:  Updated goal date for POC: 04/11/23    Pt will be independent with final HEP for vestibular related deficits in order to  build upon functional gains made in therapy. Baseline: Reports compliance thus far, continues to report improvements in exercises Goal status: MET  2.  Pt will improve FGA to at least a 26/30 in order to demo decr fall risk. Baseline: 23/30; 23/30 (improved NBOS tandem but increased difficulty on stairs); 27/30 on 8/13  Goal status: MET  3.   Pt will improve DVA to a 1 line difference or less in order to demo improved VOR  Baseline: 3 line difference; 2 line difference; continues to be at a 2 line difference   2 line difference (8/20) Goal status: NOT MET  4.  Pt will improve condition 4 of mCTSIB to at least 15 seconds in order to demo improved vestibular input for balance.  Baseline: 2.5 seconds; improved to 5 seconds; 13 seconds   4 seconds on 8/22 Goal status: NOT MET   ASSESSMENT:  CLINICAL IMPRESSION:   Assessed mCTSIB today with pt able to hold conditions 1-3 for 30 seconds, but incr postural sway with condition 2. With condition 4, pt only able to hold for 3-4 seconds. When evaluation, pt only able to hold for 2.5 seconds, indicating continued significant difficulty with vestibular system for balance. Pt to follow up with ENT regarding this. Reviewed HEP with pt with pt having no questions at this time with pt will continue to perform. Pt in agreement to D/C at this time.   OBJECTIVE IMPAIRMENTS: Abnormal gait, decreased activity tolerance, decreased balance, difficulty walking, and dizziness.   ACTIVITY LIMITATIONS: locomotion level  PARTICIPATION LIMITATIONS: community activity, roller skating   PERSONAL FACTORS: Past/current experiences, Time since onset of injury/illness/exacerbation, and  3+ comorbidities: anemia, anxiety, asthma, depression, GERD, migraine, meningioma s/p resection 07/2009   are also affecting patient's functional outcome.   REHAB POTENTIAL: Good  CLINICAL DECISION MAKING: Stable/uncomplicated  EVALUATION COMPLEXITY: Low   PLAN:  PT FREQUENCY: 1-2x/week (Recommending additional 1-2x week for 4 weeks)   PT DURATION: 4 weeks  PLANNED INTERVENTIONS: Therapeutic exercises, Therapeutic activity, Neuromuscular re-education, Balance training, Gait training, Patient/Family education, Self Care, Vestibular training, and Re-evaluation  PLAN FOR NEXT SESSION: D/C  Drake Leach, PT, DPT  04/07/2023, 12:22 PM

## 2023-04-08 NOTE — Telephone Encounter (Signed)
LVM - 1st attempt

## 2023-04-08 NOTE — Telephone Encounter (Signed)
PT sent me a note about her discharge and it states discharged no action required. What is reason she is desiring to see ENT?

## 2023-04-08 NOTE — Telephone Encounter (Signed)
Due to having inner ear issues they believe she needs a pt to help resolve some of the issues since some things were not improving

## 2023-04-08 NOTE — Telephone Encounter (Signed)
Patient returned Micaiah's call and would like a call back at [phone].

## 2023-04-11 NOTE — Telephone Encounter (Signed)
Have done referral although I am not confident they will be able to help her with the dizziness.

## 2023-04-13 ENCOUNTER — Encounter: Payer: Self-pay | Admitting: Endocrinology

## 2023-04-13 ENCOUNTER — Ambulatory Visit: Payer: BC Managed Care – PPO | Admitting: Endocrinology

## 2023-04-13 VITALS — BP 112/70 | HR 80 | Ht 63.0 in | Wt 132.6 lb

## 2023-04-13 DIAGNOSIS — E041 Nontoxic single thyroid nodule: Secondary | ICD-10-CM

## 2023-04-13 LAB — T4, FREE: Free T4: 0.99 ng/dL (ref 0.60–1.60)

## 2023-04-13 LAB — TSH: TSH: 0.62 u[IU]/mL (ref 0.35–5.50)

## 2023-04-13 NOTE — Patient Instructions (Signed)
US thyroid in 1 year, few weeks prior to visit.

## 2023-04-13 NOTE — Progress Notes (Signed)
Outpatient Endocrinology Note Deborah Moo Gravley, MD  04/13/23  Patient's Name: Deborah Ortega    DOB: 1965/10/09    MRN: [id number]  REASON OF VISIT: Follow-up for thyroid nodule  PCP: Myrlene Broker, MD  REFERRING PROVIDER:    HISTORY OF PRESENT ILLNESS:   Deborah Ortega is a 57 y.o. old female with past medical history as listed below is presented for a follow up of left thyroid nodule.  Pertinent Thyroid History:  - She was diagnosed with left thyroid nodule on routine exam in 2003.  She had fine-needle aspiration of left thyroid nodule in 2003 with benign cytology.  Nodule has been monitored with serial ultrasound intermittently and has remained relatively stable.  Last ultrasound was in July 2022 showed left thyroid nodule measuring 4.3 x 2.5 x 1.5 cm, not significantly changed since prior study in 2020. -Patient was treated with levothyroxine suppressive therapy in the past for few years.  She has remained euthyroid biochemically and clinically of levothyroxine. -History of thyroid nodule in mother.  No family history of thyroid cancer.  No radiation exposure.  She has had an ultrasound exam in 7/22 which showed the following:   4.3 x 2.5 x 1.5 cm left mid thyroid nodule is not significantly changed since prior study. Prior FNA was performed on 02/27/2002.   Thyroid biopsy reported on 02/28/02 as follows   Satisfactory for evaluation.   FOLLICULAR EPITHELIUM IN A FOCAL MICROFOLLICULAR PATTERN, SEE COMMENT  There are follicular epithelial cells with admixed colloid and  blood with inflammatory cells. In a few of the slides, there is  a microfollicular pattern of growth. This may represent an adenomatous nodule in the setting of a goiter (favored).  However, a follicular lesion (adenoma/follicular neoplasm)  cannot be excluded. Suggest follow-up as clinically indicated.   Interval history 04/13/23 Patient denies neck discomfort, dysphagia, neck compressive symptoms.  No  hypo and hyperthyroid symptoms.  No new symptoms in the neck.  REVIEW OF SYSTEMS:  As per history of present illness.   PAST MEDICAL HISTORY: Past Medical History:  Diagnosis Date   ALLERGIC RHINITIS    Anemia    Anxiety    Asthma    being worked up for asthma now. Chronic cough   Depression    takes lexapro   GERD    History of kidney stones    HOH (hard of hearing)    both ears   MENINGIOMA    s/p resction 07/2009   MIGRAINE HEADACHE    PONV (postoperative nausea and vomiting)    RENAL CALCULUS, HX OF 06/25/2007, 02/2013   THYROID NODULE     PAST SURGICAL HISTORY: Past Surgical History:  Procedure Laterality Date   ANKLE SURGERY Right 04/13/2018   BREAST BIOPSY Right 10/29/2013   BREAST LUMPECTOMY WITH RADIOACTIVE SEED LOCALIZATION Right 11/19/2013   Procedure: BREAST LUMPECTOMY WITH RADIOACTIVE SEED LOCALIZATION;  Surgeon: Ernestene Mention, MD;  Location: [facility];  Service: General;  Laterality: Right;   CESAREAN SECTION     x's 2   EXTRACORPOREAL SHOCK WAVE LITHOTRIPSY Left 12/17/2021   Procedure: LEFTEXTRACORPOREAL SHOCK WAVE LITHOTRIPSY (ESWL);  Surgeon: Jerilee Field, MD;  Location: [facility];  Service: Urology;  Laterality: Left;   Meninogioma Resection  07/25/09   pariatal area head-crainiotomy-plate   WISDOM TOOTH EXTRACTION      ALLERGIES: Allergies  Allergen Reactions   Ibuprofen Rash   Buprenorphine Hcl Itching   Dust Mite Extract Itching   Gramineae  Pollens Itching   Levofloxacin     Cannot sleep   Morphine And Codeine Itching   Tramadol     Extreme sleeplessness/vomiting- can tolerate small dose per pt   Adhesive [Tape] Rash   Aleve [Naproxen Sodium] Rash   Latex Rash   Phenylephrine-Dm Anxiety    FAMILY HISTORY:  Family History  Problem Relation Age of Onset   Diabetes Other    Hyperlipidemia Other    Stroke Other    Ulcerative colitis Other    Asthma Cousin     SOCIAL HISTORY: Social History    Socioeconomic History   Marital status: Married    Spouse name: Not on file   Number of children: Not on file   Years of education: Not on file   Highest education level: Not on file  Occupational History   Not on file  Tobacco Use   Smoking status: Never   Smokeless tobacco: Never   Tobacco comments:    Married, lives with spouse and 2 kids. Pt is a Runner, broadcasting/film/video  Substance and Sexual Activity   Alcohol use: No   Drug use: No   Sexual activity: Not on file  Other Topics Concern   Not on file  Social History Narrative   Not on file   Social Determinants of Health   Financial Resource Strain: Not on file  Food Insecurity: Not on file  Transportation Needs: Not on file  Physical Activity: Not on file  Stress: Not on file  Social Connections: Not on file    MEDICATIONS:  Current Outpatient Medications  Medication Sig Dispense Refill   acetaminophen (TYLENOL) 500 MG tablet Take 1 tablet (500 mg total) by mouth every 6 (six) hours as needed. 30 tablet 0   busPIRone (BUSPAR) 10 MG tablet Take by mouth daily.     cetirizine (ZYRTEC) 10 MG tablet Take 10 mg by mouth at bedtime.      cholecalciferol (VITAMIN D3) 25 MCG (1000 UNIT) tablet Take 1,000 Units by mouth daily.     diphenhydramine-acetaminophen (TYLENOL PM) 25-500 MG TABS tablet Take 1 tablet by mouth at bedtime as needed.     docusate sodium (COLACE) 250 MG capsule Take 250 mg by mouth daily as needed.     escitalopram (LEXAPRO) 20 MG tablet Take 20 mg by mouth daily.     ferrous sulfate 325 (65 FE) MG tablet Take 325 mg by mouth See admin instructions. Take 1 tablet by mouth with breakfast one to two times per week.     meclizine (ANTIVERT) 12.5 MG tablet Take 1 tablet (12.5 mg total) by mouth 3 (three) times daily as needed for dizziness. 30 tablet 0   methocarbamol (ROBAXIN) 500 MG tablet Take 1 tablet (500 mg total) by mouth every 8 (eight) hours as needed for muscle spasms. 20 tablet 0   pantoprazole (PROTONIX) 40 MG  tablet Take 1 tablet (40 mg total) by mouth daily. 90 tablet 1   triamcinolone (KENALOG) 0.1 % paste APPLY TO ULCERS AFTER MEALS AND AT BEDTIME     No current facility-administered medications for this visit.    PHYSICAL EXAM: Vitals:   04/13/23 1253  BP: 112/70  Pulse: 80  SpO2: 99%  Weight: 132 lb 9.6 oz (60.1 kg)  Height: 5\' 3"  (1.6 m)   Body mass index is 23.49 kg/m.    General: Well developed, well nourished female in no apparent distress.  HEENT: AT/[place], no external lesions. Hearing intact to the spoken word Eyes: Conjunctiva clear  and no icterus. Neck: Trachea midline, neck supple with mild appreciable left thyromegaly or lymphadenopathy and left palpable thyroid nodule, non tender, mobile, ~ 3 cm Lungs: Clear to auscultation, no wheeze. Respirations not labored Heart: S1S2, Regular in rate and rhythm. No loud murmurs Abdomen: Soft, non tender, non distended Neurologic: Alert, oriented, normal speech, deep tendon biceps reflexes normal,  no gross focal neurological deficit Extremities: No pedal pitting edema, no tremors of outstretched hands Skin: Warm, color good.  Psychiatric: Does not appear depressed or anxious  PERTINENT HISTORIC LABORATORY AND IMAGING STUDIES:  All pertinent laboratory results were reviewed. Please see HPI also for further details.   TSH  Date Value Ref Range Status  09/29/2021 0.97 0.35 - 5.50 uIU/mL Final  08/29/2020 1.25 0.35 - 4.50 uIU/mL Final  02/13/2019 0.81 0.35 - 4.50 uIU/mL Final     ASSESSMENT / PLAN  1. Left thyroid nodule    -Patient was diagnosed with left thyroid nodule in 2003 status post FNA at that time with benign cytology.  This nodule is being monitored with intermittent/serial ultrasound with relatively stable size. -She has no neck compressive symptoms.  She has remained euthyroid without thyroid medication.  Plan: -Check thyroid function test. -Check ultrasound thyroid in 1 year few days prior to follow-up  visit.  Dorthia was seen today for follow-up.  Diagnoses and all orders for this visit:  Left thyroid nodule -     T4, free; Future -     TSH; Future -     US THYROID; Future -     TSH -     T4, free    DISPOSITION Follow up in clinic in 12 months suggested.  All questions answered and patient verbalized understanding of the plan.  Deborah Anette Barra, MD [facility] Endocrinology [facility] [address] [place], [address] [place], [place] [address] Phone # [phone]  At least part of this note was generated using voice recognition software. Inadvertent word errors may have occurred, which were not recognized during the proofreading process.

## 2023-06-03 ENCOUNTER — Other Ambulatory Visit: Payer: Self-pay | Admitting: Internal Medicine

## 2023-06-10 ENCOUNTER — Telehealth: Payer: Self-pay | Admitting: Genetic Counselor

## 2023-06-10 NOTE — Telephone Encounter (Signed)
Scheduled appointments per referral. Patient is aware of the appointment time and date as well as the address. Patient was informed to arrive 10-15 minutes prior with updated insurance information. All questions were answered.

## 2023-07-15 IMAGING — US US THYROID
1 series · 14 of 25 positions shown · non-contrast
Comparison: 03/13/2019

CLINICAL DATA: Nodule follow-up

EXAM:
THYROID ULTRASOUND
TECHNIQUE: Ultrasound examination of the thyroid gland and adjacent soft
tissues was performed.

[Series 1: us thyroid · 0.04mm/px · 14 of 38 slices shown]
[im 1/38]
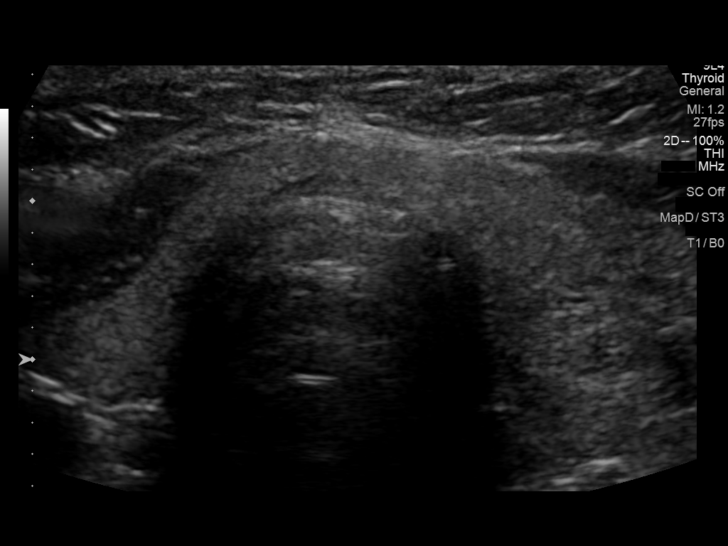
[im 4/38]
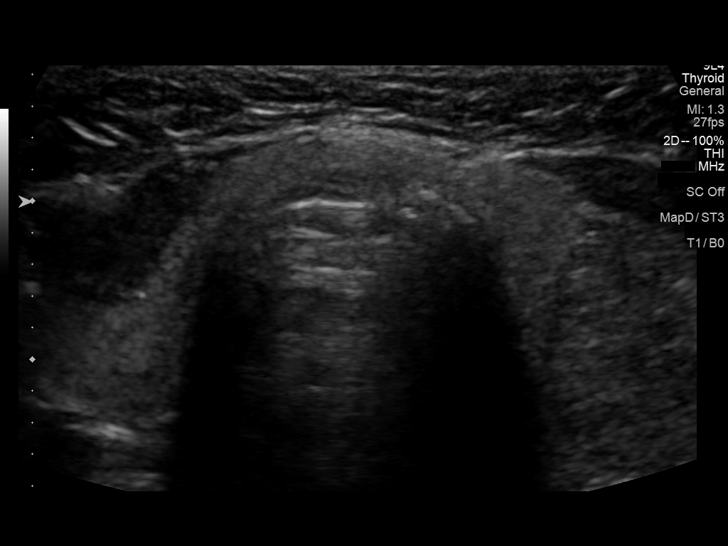
[im 7/38]
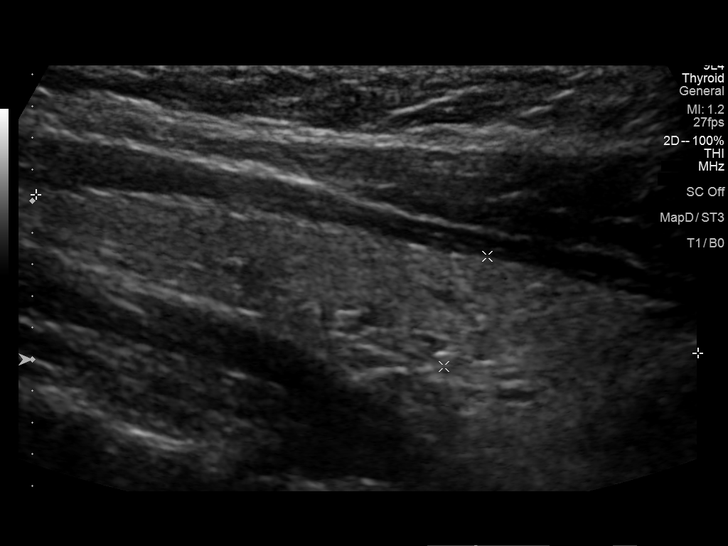
[im 10/38]
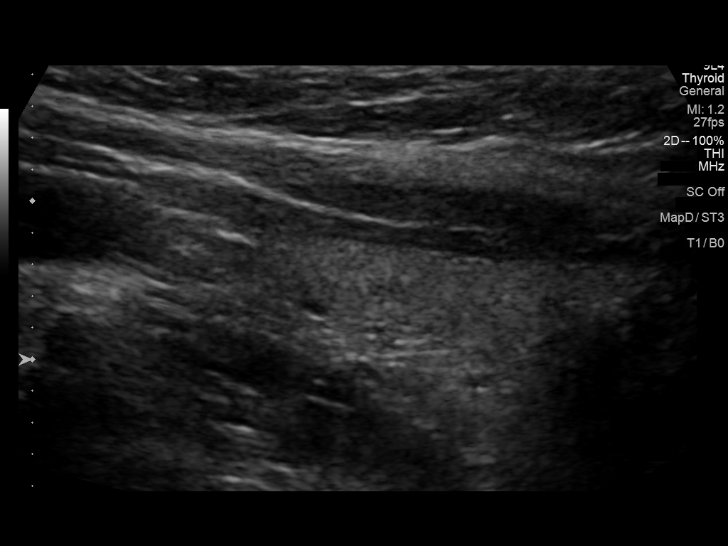
[im 13/38]
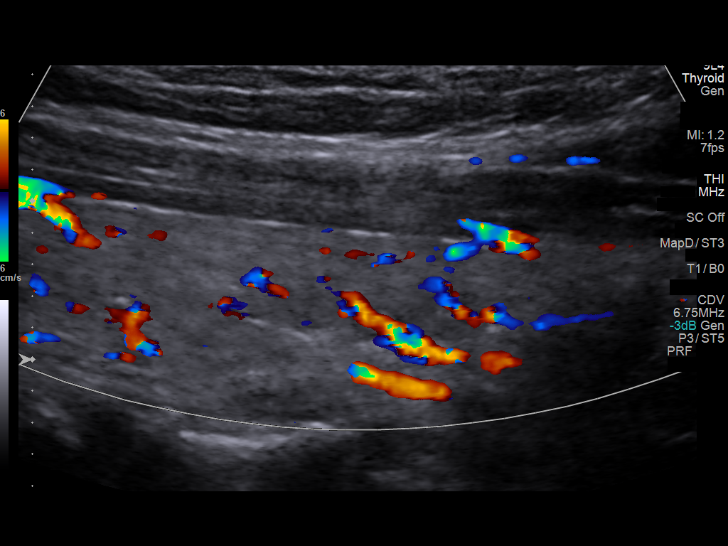
[im 14/38]
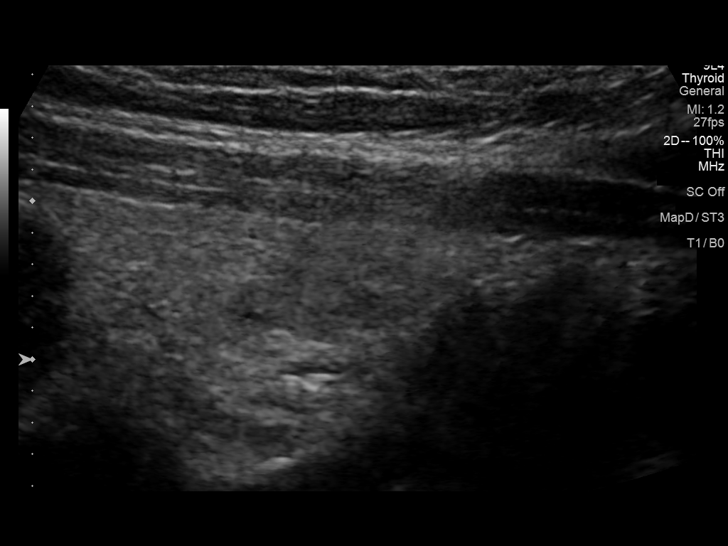
[im 17/38]
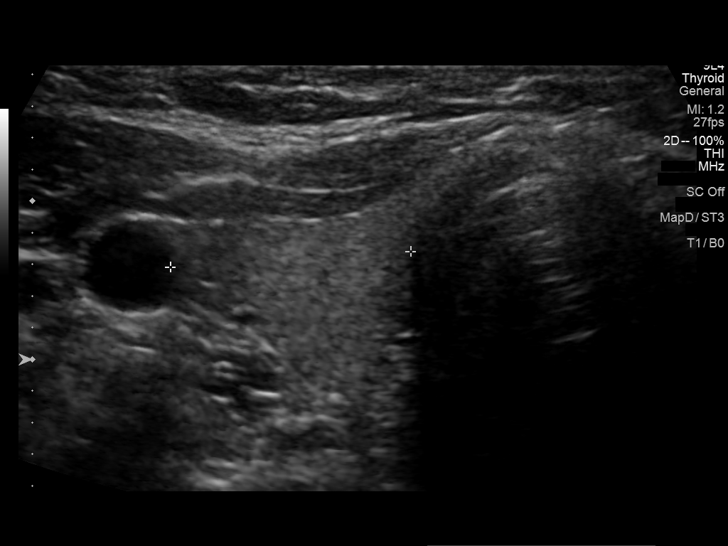
[im 21/38]
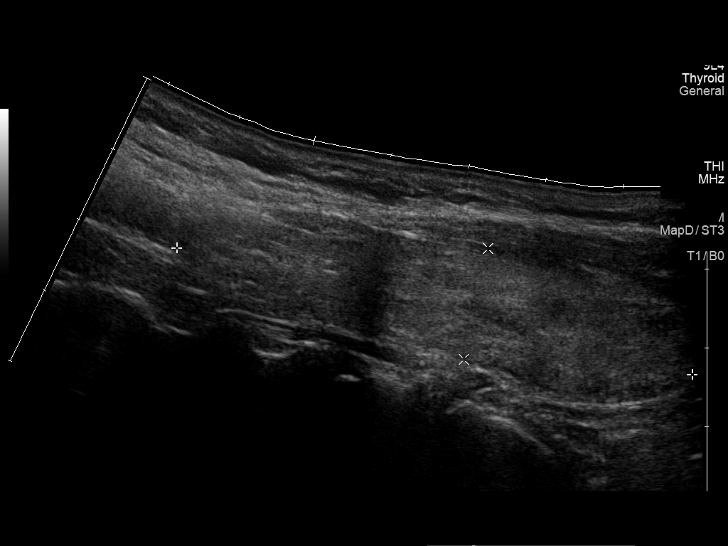
[im 24/38]
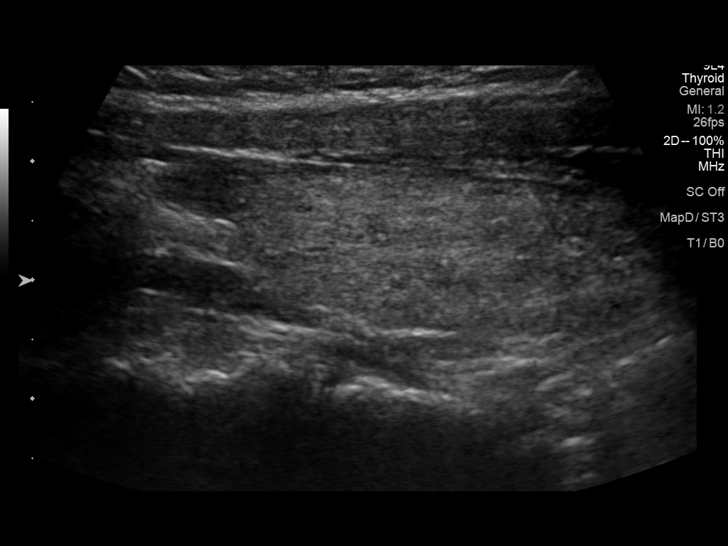
[im 25/38]
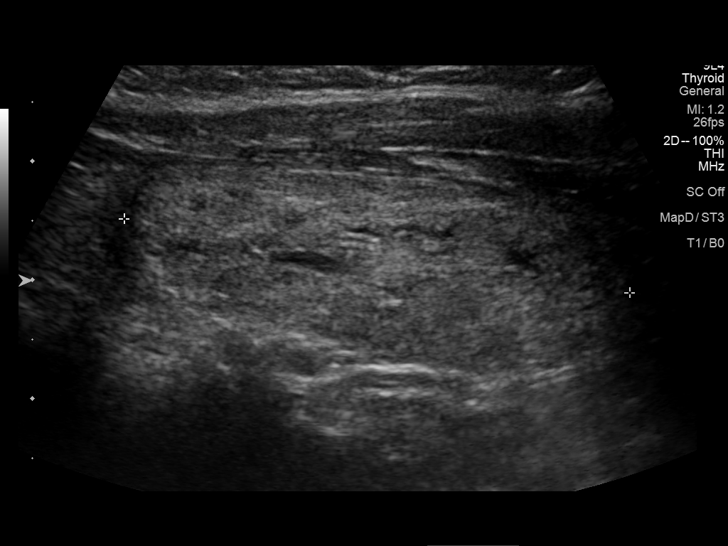
[im 28/38]
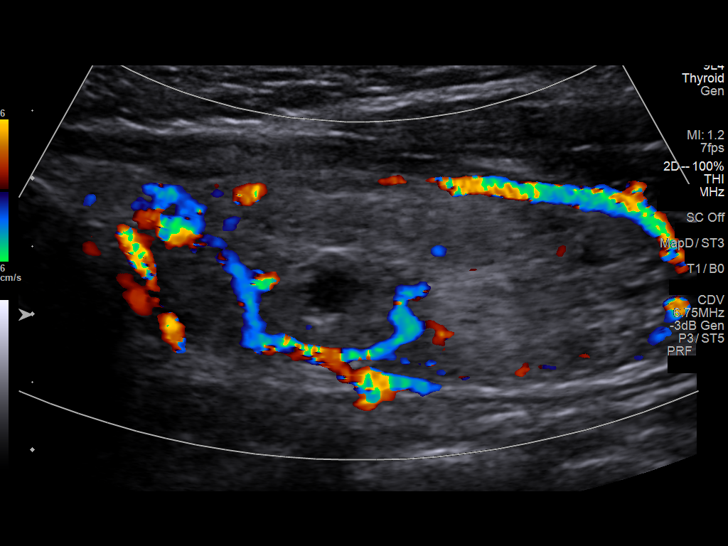
[im 31/38]
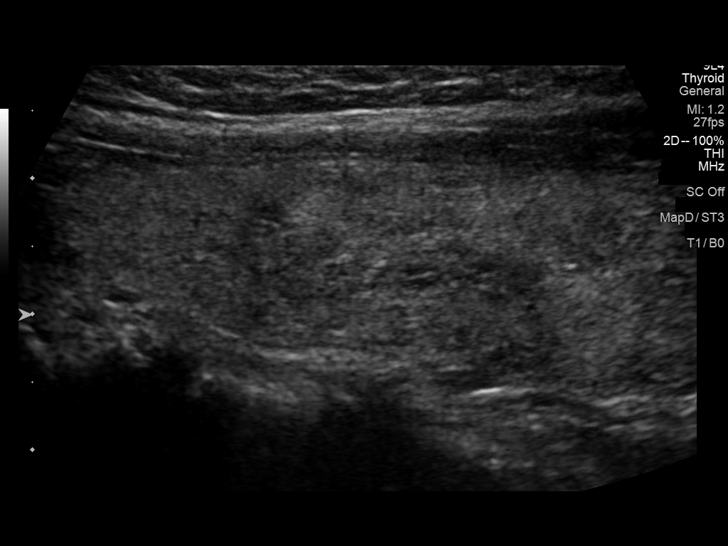
[im 34/38]
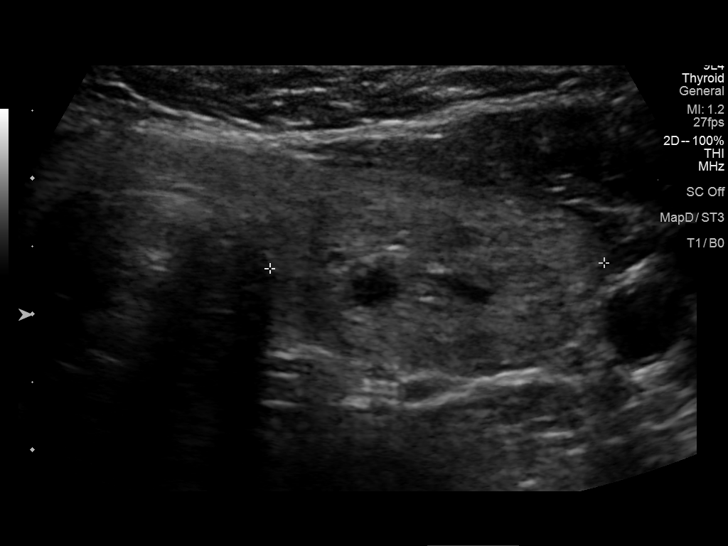
[im 38/38]
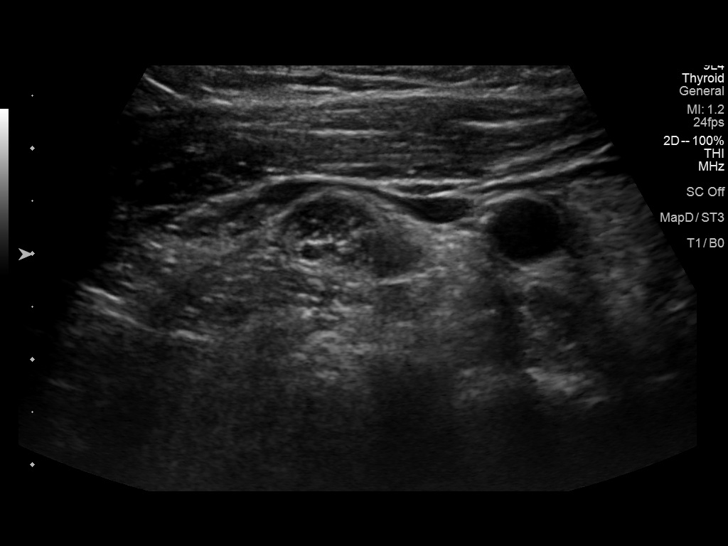

[14 of 25 positions shown; findings below may reference images not displayed]

FINDINGS: Parenchymal Echotexture: Mildly heterogeneous

Isthmus: 0.2 cm

Right lobe: 4.3 x 0.8 x 1.5 cm

Left lobe: 6.8 x 1.5 x 2.5 cm

_________________________________________________________

Estimated total number of nodules >/= 1 cm: 1

Number of spongiform nodules >/=  2 cm not described below (TR1): 0

Number of mixed cystic and solid nodules >/= 1.5 cm not described
below (TR2): 0

_________________________________________________________

4.3 x 2.5 x 1.5 cm left mid thyroid nodule is not significantly
changed since prior study. Prior FNA was performed on 02/27/2002.
IMPRESSION: Solid isoechoic left thyroid nodule is not significantly changed in
size since prior examination from 03/13/2019. Prior FNA of this
nodule was performed in [id number]. Please correlate with FNA results.

The above is in keeping with the ACR TI-RADS recommendations - [HOSPITAL] 5612;[DATE].

## 2023-07-18 ENCOUNTER — Inpatient Hospital Stay: Payer: BC Managed Care – PPO | Attending: Nurse Practitioner | Admitting: Genetic Counselor

## 2023-07-18 ENCOUNTER — Other Ambulatory Visit: Payer: Self-pay

## 2023-07-18 ENCOUNTER — Inpatient Hospital Stay: Payer: BC Managed Care – PPO

## 2023-07-18 DIAGNOSIS — Z86018 Personal history of other benign neoplasm: Secondary | ICD-10-CM

## 2023-07-18 DIAGNOSIS — Z800 Family history of malignant neoplasm of digestive organs: Secondary | ICD-10-CM

## 2023-07-18 NOTE — Progress Notes (Unsigned)
REFERRING PROVIDER: Richardean Chimera, MD [address] [place],  [place] [address]  PRIMARY PROVIDER:  Myrlene Broker, MD  PRIMARY REASON FOR VISIT:  Encounter Diagnoses  Name Primary?   History of meningioma    Family history of pancreatic cancer Yes    HISTORY OF PRESENT ILLNESS:   Deborah Ortega, a 57 y.o. female, was seen for a [facility] cancer genetics consultation at the request of Dr. Arelia Sneddon due to a family history of pancreatic cancer.  [name] presents to clinic today to discuss the possibility of a hereditary predisposition to cancer, to discuss genetic testing, and to further clarify her future cancer risks, as well as potential cancer risks for family members.     CANCER/TUMOR HISTORY:  At the age of 53, [name] was diagnosed with a low grade meningioma s/p resection.     SCREENING/RISK FACTORS:  Mammogram within the last year: yes; category c density  R breast lumpectomy in 2015 (fibroadenoma).  Colonoscopy: no; not examined.  Normal Cologuard previously.  Hysterectomy: no.  Ovaries intact: yes.  Menarche was at age 88.  First live birth at age 69.  Menopausal status: postmenopausal.  LMP 2 years ago.  OCP use for approximately  0  years.  HRT use: 0 years. Dermatology screening: annually.  Hx of atypical moles per patient.  Nevous lipomatosus superficials in 2019.  Regular thyroid ultrasounds to monitor nodules.  Hx of two thyroid biopsies--no malignancy detected per pt.   Past Medical History:  Diagnosis Date   ALLERGIC RHINITIS    Anemia    Anxiety    Asthma    being worked up for asthma now. Chronic cough   Depression    takes lexapro   GERD    History of kidney stones    HOH (hard of hearing)    both ears   MENINGIOMA    s/p resction 07/2009   MIGRAINE HEADACHE    PONV (postoperative nausea and vomiting)    RENAL CALCULUS, HX OF 06/25/2007, 02/2013   THYROID NODULE     Past Surgical History:  Procedure Laterality Date    ANKLE SURGERY Right 04/13/2018   BREAST BIOPSY Right 10/29/2013   BREAST LUMPECTOMY WITH RADIOACTIVE SEED LOCALIZATION Right 11/19/2013   Procedure: BREAST LUMPECTOMY WITH RADIOACTIVE SEED LOCALIZATION;  Surgeon: Ernestene Mention, MD;  Location: [facility];  Service: General;  Laterality: Right;   CESAREAN SECTION     x's 2   EXTRACORPOREAL SHOCK WAVE LITHOTRIPSY Left 12/17/2021   Procedure: LEFTEXTRACORPOREAL SHOCK WAVE LITHOTRIPSY (ESWL);  Surgeon: Jerilee Field, MD;  Location: [facility];  Service: Urology;  Laterality: Left;   Meninogioma Resection  07/25/09   pariatal area head-crainiotomy-plate   WISDOM TOOTH EXTRACTION         FAMILY HISTORY:  We obtained a detailed, 4-generation family history.  Significant diagnoses are listed below: Family History  Problem Relation Age of Onset   Melanoma Mother        leg; dx 53s; excision only   Pancreatic cancer Father 75       neuroendocrine tumor   Prostate cancer Paternal Uncle        metastatic      Ms. Anger is unaware of previous family history of genetic testing for hereditary cancer risks.  There is no reported Ashkenazi Jewish ancestry. There is no known consanguinity.  She denied any known family history of hyperparathyroidism.   GENETIC COUNSELING ASSESSMENT: [name] is  a 57 y.o. 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  The family history of metastatic prostate cancer is also somewhat suggestive of a hereditary predisposition to cancer. We, therefore, discussed and recommended the following at today's visit.   DISCUSSION:  We discussed that, in general, most cancer is not inherited in families, but instead is sporadic or familial. Sporadic cancers occur by chance and typically happen at older ages (>50 years) as this type  of cancer is caused by genetic changes acquired during an individual's lifetime. Some families have more cancers than would be expected by chance; however, the ages or types of cancer are not consistent with a known genetic mutation or known genetic mutations have been ruled out. This type of familial cancer is thought to be due to a combination of multiple genetic, environmental, hormonal, and lifestyle factors. While this combination of factors likely increases the risk of cancer, the exact source of this risk is not currently identifiable or testable.    We discussed that approximately 5-10% of cancer is hereditary, meaning that it is due to a mutation in a single gene that is passed down from generation to generation in a family.  We reviewed that pancreatic neuroendocrine tumors alone are not highly suspicious of a hereditary cancer syndrome but, in rare cases, can be associated with certain hereditary conditions such as multiple endocrine neoplasia type I (MEN1).  Most individuals with MEN1 have hyperparathyroidism, which is not consistent with her family history.  There are other genes that can be associated with hereditary pancreatic neuroendocrine tumors including but not limited to CDKN1B, which is typically not associated with hyperparathyroidism.  In both of these conditions, meningiomas can be present.  Given her father's history of pancreatic neuroendocrine tumors and her history of a meningioma, we discussed that it's reasonable for this family to consider genetic testing for these conditions.  Furthermore, given the family history of metastatic prostate cancer, prostate cancer can be associated with mutations in genes including but not limited to BRCA1/2.    We reviewed the characteristics, features and inheritance patterns of hereditary cancer syndromes. We also discussed genetic testing, including the appropriate family members to test, the process of testing, insurance coverage and  turn-around-time for results. We discussed the implications of a negative, positive, and/or variant of uncertain significant result. We reviewed that genetic testing can help inform individuals about future cancer risks so that additional screening/preventative strategies can be implemented as well as provide risk information to relatives.  We discussed that her father would be a more informative family member to test, given that he has a history of a pancreatic neuroendocrine tumor and is more closely related to the relative (the patients paternal uncle) who has metastatic prostate cancer.  Ms. Bellinger called her father during the appointment, and he is interested in proceeding with genetic testing.  We discussed that the results of her father's testing can inform whether genetic testing is indicated for Ms. Haroon.    We discussed the Genetic Information Non-Discrimination Act (GINA) of 2008, which helps protect individuals against genetic discrimination based on their genetic test results.  It impacts both health insurance and employment.  With health insurance, it protects against genetic test results being used for increased premiums or policy termination. For employment, it protects against hiring, firing and promoting decisions based on genetic test results.  GINA does not  apply to those in the [employer], those who work for companies with less than 15 employees, and new life insurance or long-term disability Medical illustrator.  Health status due to a cancer diagnosis is not protected under GINA.  PLAN: Ms. Bransky is not pursuing genetic testing at today's visit, given her father is a more informative relative to test and is planning to proceed with genetic testing.  We reached out her father in order to coordinate testing.  We remain available to coordinate any genetic testing for Ms. Alkhatib in the future, especially if her father tests positive for a hereditary cancer gene mutation.  We recommend Ms.  Carberry continue to follow the cancer screening guidelines given by her primary healthcare provider.   Lastly, we encouraged Ms. Cantave to remain in contact with cancer genetics annually so that we can continuously update the family history and inform her of any changes in cancer genetics and testing that may be of benefit for this family.   Ms. Drapkin questions were answered to her satisfaction today. Our contact information was provided should additional questions or concerns arise. Thank you for the referral and allowing [place] to share in the care of your patient.   Shanesha Bednarz M. Rennie Plowman, MS, [facility] Genetic Counselor [email or web address] (P) [phone]   The patient was seen for a total of 30 minutes in face-to-face genetic counseling.  The patient was seen alone.  Drs. Gunnar Bulla and/or Mosetta Putt were available to discuss this case as needed.  _______________________________________________________________________ For Office Staff:  Number of people involved in session: 1 Was an Intern/ student involved with case: no

## 2023-07-19 ENCOUNTER — Encounter: Payer: Self-pay | Admitting: Genetic Counselor

## 2023-09-13 NOTE — Progress Notes (Signed)
Sep 13, 2023 Addendum:   With permission of her father, we discussed her father's negative genetic testing of the [facility] CancerNext-Expanded +RNAinsight Panel.  The CancerNext-Expanded gene panel offered by [facility] and includes sequencing, rearrangement, and RNA analysis for the following 76 genes: AIP, ALK, APC, ATM, AXIN2, BAP1, BARD1, BMPR1A, BRCA1, BRCA2, BRIP1, CDC73, CDH1, CDK4, CDKN1B, CDKN2A, CEBPA, CHEK2, CTNNA1, DDX41, DICER1, ETV6, FH, FLCN, GATA2, LZTR1, MAX, MBD4, MEN1, MET, MLH1, MSH2, MSH3, MSH6, MUTYH, NF1, NF2, NTHL1, PALB2, PHOX2B, PMS2, POT1, PRKAR1A, PTCH1, PTEN, RAD51C, RAD51D, RB1, RET, RUNX1, SDHA, SDHAF2, SDHB, SDHC, SDHD, SMAD4, SMARCA4, SMARCB1, SMARCE1, STK11, SUFU, TMEM127, TP53, TSC1, TSC2, VHL, and WT1 (sequencing and deletion/duplication); EGFR, HOXB13, KIT, MITF, PDGFRA, POLD1, and POLE (sequencing only); EPCAM and GREM1 (deletion/duplication only).   We reviewed the below recommendations with Ms. Deren:   Since her father did not inherit a identifiable mutation in a cancer predisposition gene included on this panel, his children could not have inherited a known mutation from him in one of these genes. Individuals in this family might be at some increased risk of developing cancer, over the general population risk, due to the family history of cancer.  Individuals in the family should notify their providers of the family history of cancer and continue to follow the screening recommendations of their providers.  Other members of the family may still carry a pathogenic variant in one of these genes that Ms. Mohiuddin's father did not inherit. Based on the family history, we recommend her father's brother, who was diagnosed with metastatic prostate cancer, have genetic counseling and testing.  FOLLOW-UP:  Cancer genetics is a rapidly advancing field and it is possible that new genetic tests will be appropriate for her and/or her family members in the future. We encourage  Ms. Gingrich to remain in contact with cancer genetics, so we can update her personal and family histories and let her know of advances in cancer genetics that may benefit this family.   Our contact number was provided.  They are welcome to call us at anytime with additional questions or concerns.   Renzo Vincelette M. Rennie Plowman, MS, [facility] Genetic Counselor [email or web address] (P) [phone]

## 2023-12-01 ENCOUNTER — Other Ambulatory Visit: Payer: Self-pay | Admitting: Internal Medicine

## 2024-02-01 ENCOUNTER — Other Ambulatory Visit: Payer: Self-pay | Admitting: Obstetrics and Gynecology

## 2024-02-01 DIAGNOSIS — Z1231 Encounter for screening mammogram for malignant neoplasm of breast: Secondary | ICD-10-CM

## 2024-02-28 ENCOUNTER — Ambulatory Visit: Payer: Self-pay

## 2024-03-05 ENCOUNTER — Ambulatory Visit
Admission: RE | Admit: 2024-03-05 | Discharge: 2024-03-05 | Disposition: A | Discharge status: Home / Self Care | Payer: Self-pay | Source: Ambulatory Visit | Attending: Obstetrics and Gynecology | Admitting: Obstetrics and Gynecology

## 2024-03-05 DIAGNOSIS — Z1231 Encounter for screening mammogram for malignant neoplasm of breast: Secondary | ICD-10-CM

## 2024-03-12 ENCOUNTER — Encounter: Payer: Self-pay | Admitting: Endocrinology

## 2024-03-19 ENCOUNTER — Ambulatory Visit
Admission: RE | Admit: 2024-03-19 | Discharge: 2024-03-19 | Disposition: A | Discharge status: Home / Self Care | Payer: BC Managed Care – PPO | Source: Ambulatory Visit | Attending: Endocrinology | Admitting: Endocrinology

## 2024-03-19 DIAGNOSIS — E041 Nontoxic single thyroid nodule: Secondary | ICD-10-CM

## 2024-03-22 ENCOUNTER — Ambulatory Visit: Payer: Self-pay | Admitting: Endocrinology

## 2024-03-28 ENCOUNTER — Ambulatory Visit: Payer: Self-pay

## 2024-03-28 NOTE — Telephone Encounter (Signed)
 FYI Only or Action Required?: FYI only for provider.  Patient was last seen in primary care on 01/07/2023 by Rollene Almarie LABOR, MD.  Called Nurse Triage reporting Arm Injury.  Symptoms began several months ago.  Interventions attempted: Nothing.  Symptoms are: unchanged.  Triage Disposition: See Physician Within 24 Hours  Patient/caregiver understands and will follow disposition?: Yes     Copied from CRM [id number]. Topic: Clinical - Red Word Triage >> Mar 28, 2024  3:23 PM Lavanda D wrote: Red Word that prompted transfer to Nurse Triage: Patient hurt her left arm, she was trying to exercise and was working with a 5lb dumbbell and it started bothering her. It happened a couple months ago and still has not improved. Patient feels pain with movement. Reason for Disposition  [1] MODERATE pain (e.g., interferes with normal activities) AND [2] high-risk adult (e.g., age > 60 years, osteoporosis, chronic steroid use)  Answer Assessment - Initial Assessment Questions 1. MECHANISM: How did the injury happen?     Exercising with a 5 lb weight 2. ONSET: When did the injury happen? (e.g., minutes, hours ago)      Couple months ago 3. LOCATION: Where is the injury located? Which arm?     Left arm 4. APPEARANCE of INJURY: What does the injury look like?      normal 5. SEVERITY: Can you use the arm normally?      If she using the arm she gets a sharp pain 6. SWELLING or BRUISING: is there any swelling or bruising? If Yes, ask: How large is it? (e.g., inches, centimeters)      denies 7. PAIN: Is there pain? If Yes, ask: How bad is the pain? (Scale 0-10; or none, mild, moderate, severe)     moderate 8. TETANUS: For any breaks in the skin, ask: When was your last tetanus booster?     na 9. OTHER SYMPTOMS: Do you have any other symptoms?  (e.g., numbness in hand)     denies 10. PREGNANCY: Is there any chance you are pregnant? When was your last menstrual  period?       na  Protocols used: Arm Injury-A-AH

## 2024-03-29 ENCOUNTER — Ambulatory Visit: Admitting: Internal Medicine

## 2024-03-29 ENCOUNTER — Telehealth: Payer: Self-pay

## 2024-03-29 ENCOUNTER — Encounter: Payer: Self-pay | Admitting: Internal Medicine

## 2024-03-29 ENCOUNTER — Ambulatory Visit

## 2024-03-29 VITALS — BP 118/68 | HR 76 | Temp 97.9°F | Ht 63.0 in | Wt 126.0 lb

## 2024-03-29 DIAGNOSIS — M25512 Pain in left shoulder: Secondary | ICD-10-CM

## 2024-03-29 NOTE — Progress Notes (Signed)
 Patient ID: Deborah Ortega, female   DOB: [birth year], 58 y.o.   MRN: [id number]        Chief Complaint: follow up left upper arm and shoulder pain       HPI:  Deborah Ortega is a 58 y.o. female here with c/o 2 mo onset left upper arm and shoulder pain, after working to lift 5 lb wts for exercise at time of onset.  Aching moderate to left shoulder, reduced ROM to forward elevation, and pain radiation distally to tricep to elbow tip.  No specific neck pr radicular pain.  Pt denies chest pain, increased sob or doe, wheezing, orthopnea, PND, increased LE swelling, palpitations, dizziness or syncope.   Pt denies polydipsia, polyuria, or new focal neuro s/s.          Wt Readings from Last 3 Encounters:  03/29/24 126 lb (57.2 kg)  04/13/23 132 lb 9.6 oz (60.1 kg)  01/07/23 137 lb (62.1 kg)   BP Readings from Last 3 Encounters:  03/29/24 118/68  04/13/23 112/70  04/07/23 104/75         Past Medical History:  Diagnosis Date   ALLERGIC RHINITIS    Anemia    Anxiety    Asthma    being worked up for asthma now. Chronic cough   Depression    takes lexapro   GERD    History of kidney stones    HOH (hard of hearing)    both ears   MENINGIOMA    s/p resction 07/2009   MIGRAINE HEADACHE    PONV (postoperative nausea and vomiting)    RENAL CALCULUS, HX OF 06/25/2007, 02/2013   THYROID  NODULE    Past Surgical History:  Procedure Laterality Date   ANKLE SURGERY Right 04/13/2018   BREAST BIOPSY Right 10/29/2013   BREAST LUMPECTOMY WITH RADIOACTIVE SEED LOCALIZATION Right 11/19/2013   Procedure: BREAST LUMPECTOMY WITH RADIOACTIVE SEED LOCALIZATION;  Surgeon: Elon CHRISTELLA Pacini, MD;  Location: [facility];  Service: General;  Laterality: Right;   CESAREAN SECTION     x's 2   EXTRACORPOREAL SHOCK WAVE LITHOTRIPSY Left 12/17/2021   Procedure: LEFTEXTRACORPOREAL SHOCK WAVE LITHOTRIPSY (ESWL);  Surgeon: Nieves Cough, MD;  Location: [facility];  Service: Urology;   Laterality: Left;   Meninogioma Resection  07/25/09   pariatal area head-crainiotomy-plate   WISDOM TOOTH EXTRACTION      reports that she has never smoked. She has never used smokeless tobacco. She reports that she does not drink alcohol and does not use drugs. family history includes Asthma in her cousin; Diabetes in an other family member; Hyperlipidemia in an other family member; Melanoma in her mother; Pancreatic cancer (age of onset: 87) in her father; Prostate cancer in her paternal uncle; Stroke in an other family member; Ulcerative colitis in an other family member. Allergies  Allergen Reactions   Ibuprofen  Rash   Buprenorphine Hcl Itching   Dust Mite Extract Itching   Gramineae Pollens Itching   Levofloxacin     Cannot sleep   Morphine And Codeine  Itching   Tramadol      Extreme sleeplessness/vomiting- can tolerate small dose per pt   Adhesive [Tape] Rash   Aleve [Naproxen Sodium] Rash   Latex Rash   Phenylephrine-Dm Anxiety   Current Outpatient Medications on File Prior to Visit  Medication Sig Dispense Refill   acetaminophen  (TYLENOL ) 500 MG tablet Take 1 tablet (500 mg total) by mouth every 6 (six) hours as needed. 30 tablet 0  busPIRone (BUSPAR) 10 MG tablet Take by mouth daily.     cetirizine  (ZYRTEC ) 10 MG tablet Take 10 mg by mouth at bedtime.      cholecalciferol (VITAMIN D3) 25 MCG (1000 UNIT) tablet Take 1,000 Units by mouth daily.     diphenhydramine -acetaminophen  (TYLENOL  PM) 25-500 MG TABS tablet Take 1 tablet by mouth at bedtime as needed.     docusate sodium (COLACE) 250 MG capsule Take 250 mg by mouth daily as needed.     escitalopram (LEXAPRO) 20 MG tablet Take 20 mg by mouth daily.     ferrous sulfate 325 (65 FE) MG tablet Take 325 mg by mouth See admin instructions. Take 1 tablet by mouth with breakfast one to two times per week.     meclizine  (ANTIVERT ) 12.5 MG tablet Take 1 tablet (12.5 mg total) by mouth 3 (three) times daily as needed for dizziness.  30 tablet 0   methocarbamol  (ROBAXIN ) 500 MG tablet Take 1 tablet (500 mg total) by mouth every 8 (eight) hours as needed for muscle spasms. 20 tablet 0   pantoprazole  (PROTONIX ) 40 MG tablet TAKE 1 TABLET BY MOUTH EVERY DAY 90 tablet 1   triamcinolone (KENALOG) 0.1 % paste APPLY TO ULCERS AFTER MEALS AND AT BEDTIME     No current facility-administered medications on file prior to visit.        ROS:  All others reviewed and negative.  Objective        PE:  BP 118/68 (BP Location: Right Leg, Patient Position: Sitting, Cuff Size: Normal)   Pulse 76   Temp 97.9 F (36.6 C) (Oral)   Ht 5' 3 (1.6 m)   Wt 126 lb (57.2 kg)   LMP  (LMP Unknown)   SpO2 98%   BMI 22.32 kg/m                 Constitutional: Pt appears in NAD               HENT: Head: NCAT.                Right Ear: External ear normal.                 Left Ear: External ear normal.                Eyes: . Pupils are equal, round, and reactive to light. Conjunctivae and EOM are normal               Nose: without d/c or deformity               Neck: Neck supple. Gross normal ROM               Cardiovascular: Normal rate and regular rhythm.                 Pulmonary/Chest: Effort normal and breath sounds without rales or wheezing.                Left shoulder NT but pain for forward elevate but unable to reach more than approx 80% overhead; no rash or specific tricep or elbow pain               Neurological: Pt is alert. At baseline orientation, motor grossly intact               Skin: Skin is warm. No rashes, no other new lesions, LE edema - none  Psychiatric: Pt behavior is normal without agitation   Micro: none  Cardiac tracings I have personally interpreted today:  none  Pertinent Radiological findings (summarize): none   Lab Results  Component Value Date   WBC 8.5 01/07/2023   HGB 14.4 01/07/2023   HCT 43.8 01/07/2023   PLT 323.0 01/07/2023   GLUCOSE 77 01/07/2023   CHOL 225 (H) 01/07/2023   TRIG  146.0 01/07/2023   HDL 48.00 01/07/2023   LDLDIRECT 142.6 02/26/2013   LDLCALC 148 (H) 01/07/2023   ALT 9 01/07/2023   AST 12 01/07/2023   NA 140 01/07/2023   K 4.1 01/07/2023   CL 103 01/07/2023   CREATININE 0.63 01/07/2023   BUN 12 01/07/2023   CO2 28 01/07/2023   TSH 0.62 04/13/2023   HGBA1C 5.5 09/29/2021   Assessment/Plan:  SHONTIA GILLOOLY is a 58 y.o. White or Caucasian [1] female with  has a past medical history of ALLERGIC RHINITIS, Anemia, Anxiety, Asthma, Depression, GERD, History of kidney stones, HOH (hard of hearing), MENINGIOMA, MIGRAINE HEADACHE, PONV (postoperative nausea and vomiting), RENAL CALCULUS, HX OF (06/25/2007, 02/2013), and THYROID  NODULE.  Acute pain of left shoulder Exam c/w possible left rotater cuff tear or injury, now persistent x 2 mo, for left shoulder xray, and refer sport med, declines nsaid prn due to ibuprofen  allergy   Followup: Return if symptoms worsen or fail to improve.  Lynwood Rush, MD 03/29/2024 8:11 PM [facility]

## 2024-03-29 NOTE — Telephone Encounter (Signed)
 Pt is coming in today to see Dr Norleen whom I am working with and I am also forwarding him this as well. I also have verbally told him this as well

## 2024-03-29 NOTE — Assessment & Plan Note (Signed)
 Exam c/w possible left rotater cuff tear or injury, now persistent x 2 mo, for left shoulder xray, and refer sport med, declines nsaid prn due to ibuprofen  allergy 

## 2024-03-29 NOTE — Patient Instructions (Signed)
 Please continue all other medications as before, and refills have been done if requested.  Please have the pharmacy call with any other refills you may need.  Please keep your appointments with your specialists as you may have planned  You will be contacted regarding the referral for: Sports Medicine  Please go to the XRAY Department in the first floor for the x-ray testing  You will be contacted by phone if any changes need to be made immediately.  Otherwise, you will receive a letter about your results with an explanation, but please check with MyChart first.

## 2024-03-29 NOTE — Telephone Encounter (Signed)
 Copied from CRM #[id number]. Topic: Clinical - Request for Lab/Test Order >> Mar 28, 2024  3:22 PM Deborah Ortega wrote: Reason for CRM: Patient is having her annual physical on 8/25 and would like to know if her blood work will be done on the day of her physical or if this is done prior, etc. Please call to confirm.

## 2024-03-30 ENCOUNTER — Telehealth: Payer: Self-pay

## 2024-03-30 NOTE — Telephone Encounter (Signed)
 I have called the patient and relayed back the message from Dr Rollene and sent the patient a my chart message as well.

## 2024-03-30 NOTE — Telephone Encounter (Signed)
 We can do either. Typically it is easier to do day of visit if done prior and any concerns come up sometimes a second blood draw is needed.

## 2024-03-30 NOTE — Telephone Encounter (Signed)
 Can patient do labs first then have appt with you after?

## 2024-04-09 ENCOUNTER — Encounter: Admitting: Internal Medicine

## 2024-04-11 NOTE — Progress Notes (Unsigned)
 Deborah Ortega D.[facility] [address] [place] [address] Phone: [phone]   Assessment and Plan:    1. Acute pain of left shoulder (Primary) 2. Subacromial bursitis of left shoulder joint -Acute, initial sports medicine visit - Consistent with subacromial bursitis versus rotator cuff strain from new activity lifting weights - Reviewed x-ray with patient.  My interpretation: No acute fracture or dislocation.  Very small humeral head spur.  Mild AC joint degenerative changes - Patient elected for subacromial CSI.  Tolerated well per note below - Patient does not tolerate NSAIDs due to side effect of rash and does not tolerate p.o. prednisone  due to side effect of insomnia - Start HEP for rotator cuff  Procedure: Subacromial Injection Side: Left  Risks explained and consent was given verbally. The site was cleaned with alcohol prep. A steroid injection was performed from posterior approach using 2mL of 1% lidocaine  without epinephrine  and 1mL of kenalog 40mg /ml. This was well tolerated.  Needle was removed, hemostasis achieved, and post injection instructions were explained.   Pt was advised to call or return to clinic if these symptoms worsen or fail to improve as anticipated.    15 additional minutes spent for educating Therapeutic Home Exercise Program.  This included exercises focusing on stretching, strengthening, with focus on eccentric aspects.   Long term goals include an improvement in range of motion, strength, endurance as well as avoiding reinjury. Patient's frequency would include in 1-2 times a day, 3-5 times a week for a duration of 6-12 weeks. Proper technique shown and discussed handout in great detail with ATC.  All questions were discussed and answered.    Pertinent previous records reviewed include left knee x-ray 03/29/2024   Follow Up: 4 weeks for reevaluation.  If no improvement or worsening of symptoms, could consider  physical therapy referral   Subjective:   I, Rye Dorado, am serving as a Neurosurgeon for Doctor Morene Mace  Chief Complaint: left shoulder pain  HPI:   04/12/2024 Patient is a 58 year old female with left shoulder pain. Patient states pain for a couple of months. Decreased ROM. She was using a 5lb weight doing arm workout. No true injury. Pain radiates down her arm. Isnt able to sleep through the night. Intermittent tylenol  for the pain doesn't know if it helps.    Relevant Historical Information: None pertinent  Additional pertinent review of systems negative.   Current Outpatient Medications:    acetaminophen  (TYLENOL ) 500 MG tablet, Take 1 tablet (500 mg total) by mouth every 6 (six) hours as needed., Disp: 30 tablet, Rfl: 0   busPIRone (BUSPAR) 10 MG tablet, Take by mouth daily., Disp: , Rfl:    cetirizine  (ZYRTEC ) 10 MG tablet, Take 10 mg by mouth at bedtime. , Disp: , Rfl:    cholecalciferol (VITAMIN D3) 25 MCG (1000 UNIT) tablet, Take 1,000 Units by mouth daily., Disp: , Rfl:    diphenhydramine -acetaminophen  (TYLENOL  PM) 25-500 MG TABS tablet, Take 1 tablet by mouth at bedtime as needed., Disp: , Rfl:    docusate sodium (COLACE) 250 MG capsule, Take 250 mg by mouth daily as needed., Disp: , Rfl:    escitalopram (LEXAPRO) 20 MG tablet, Take 20 mg by mouth daily., Disp: , Rfl:    ferrous sulfate 325 (65 FE) MG tablet, Take 325 mg by mouth See admin instructions. Take 1 tablet by mouth with breakfast one to two times per week., Disp: , Rfl:    meclizine  (  ANTIVERT ) 12.5 MG tablet, Take 1 tablet (12.5 mg total) by mouth 3 (three) times daily as needed for dizziness., Disp: 30 tablet, Rfl: 0   methocarbamol  (ROBAXIN ) 500 MG tablet, Take 1 tablet (500 mg total) by mouth every 8 (eight) hours as needed for muscle spasms., Disp: 20 tablet, Rfl: 0   pantoprazole  (PROTONIX ) 40 MG tablet, TAKE 1 TABLET BY MOUTH EVERY DAY, Disp: 90 tablet, Rfl: 1   triamcinolone (KENALOG) 0.1 % paste,  APPLY TO ULCERS AFTER MEALS AND AT BEDTIME, Disp: , Rfl:    Objective:     Vitals:   04/12/24 0845  BP: 118/82  Pulse: 78  SpO2: 94%  Weight: 126 lb (57.2 kg)  Height: 5' 3 (1.6 m)      Body mass index is 22.32 kg/m.    Physical Exam:    Gen: Appears well, nad, nontoxic and pleasant Neuro:sensation intact, strength is 5/5 with df/pf/inv/ev, muscle tone wnl Skin: no suspicious lesion or defmority Psych: A&O, appropriate mood and affect  Left shoulder:  No deformity, swelling or muscle wasting No scapular winging FF 160 with painful arc, abd 160 with painful arc, int 15, ext 80 NTTP over the [place], clavicle, ac, coracoid, biceps groove, humerus, deltoid, trapezius, cervical spine Positive neer, hawkins, empty can, obriens, crossarm, Negative subscap liftoff, speeds Neg ant drawer, sulcus sign, apprehension Negative Spurling's test bilat FROM of neck    Electronically signed by:  Odis Mace D[facility] 9:16 AM 04/12/24

## 2024-04-12 ENCOUNTER — Encounter: Payer: Self-pay | Admitting: Endocrinology

## 2024-04-12 ENCOUNTER — Ambulatory Visit: Payer: BC Managed Care – PPO | Admitting: Endocrinology

## 2024-04-12 ENCOUNTER — Ambulatory Visit: Admitting: Sports Medicine

## 2024-04-12 VITALS — BP 118/82 | HR 78 | Ht 63.0 in | Wt 126.0 lb

## 2024-04-12 VITALS — BP 112/60 | HR 77 | Resp 20 | Ht 63.0 in | Wt 125.4 lb

## 2024-04-12 DIAGNOSIS — M25512 Pain in left shoulder: Secondary | ICD-10-CM

## 2024-04-12 DIAGNOSIS — R7989 Other specified abnormal findings of blood chemistry: Secondary | ICD-10-CM

## 2024-04-12 DIAGNOSIS — E041 Nontoxic single thyroid nodule: Secondary | ICD-10-CM | POA: Diagnosis not present

## 2024-04-12 DIAGNOSIS — M7552 Bursitis of left shoulder: Secondary | ICD-10-CM

## 2024-04-12 LAB — TSH: TSH: 0.39 m[IU]/L — ABNORMAL LOW (ref 0.40–4.50)

## 2024-04-12 LAB — T4, FREE: Free T4: 1.5 ng/dL (ref 0.8–1.8)

## 2024-04-12 NOTE — Patient Instructions (Addendum)
 Thank you for coming in today  Start shoulder home exercise program   4 week follow up

## 2024-04-12 NOTE — Progress Notes (Signed)
 Outpatient Endocrinology Note [place], MD  04/12/24  Patient's Name: Deborah Ortega    DOB: 26-Dec-1965    MRN: [id number]  REASON OF VISIT: Follow-up for thyroid  nodule  PCP: Rollene Almarie LABOR, MD  REFERRING PROVIDER:    HISTORY OF PRESENT ILLNESS:   Deborah Ortega is a 58 y.o. old female with past medical history as listed below is presented for a follow up of left thyroid  nodule.  Pertinent Thyroid  History:  - She was diagnosed with left thyroid  nodule on routine exam in 2003.  She had fine-needle aspiration of left thyroid  nodule in 2003 with benign cytology.  Nodule has been monitored with serial ultrasound intermittently and has remained relatively stable.  Last ultrasound was in July 2022 showed left thyroid  nodule measuring 4.3 x 2.5 x 1.5 cm, not significantly changed since prior study in 2020. -Patient was treated with levothyroxine suppressive therapy in the past for few years.  She has remained euthyroid biochemically and clinically of levothyroxine. -History of thyroid  nodule in mother.  No family history of thyroid  cancer.  No radiation exposure.  She has had an ultrasound exam in 7/22 which showed the following:   4.3 x 2.5 x 1.5 cm left mid thyroid  nodule is not significantly changed since prior study. Prior FNA was performed on 02/27/2002.   Thyroid  biopsy reported on 02/28/02 as follows   Satisfactory for evaluation.   FOLLICULAR EPITHELIUM IN A FOCAL MICROFOLLICULAR PATTERN, SEE COMMENT  There are follicular epithelial cells with admixed colloid and  blood with inflammatory cells. In a few of the slides, there is  a microfollicular pattern of growth. This may represent an adenomatous nodule in the setting of a goiter (favored).  However, a follicular lesion (adenoma/follicular neoplasm)  cannot be excluded. Suggest follow-up as clinically indicated.   Interval history  Patient had ultrasound thyroid  in August 4 I reviewed images and compared with  ultrasound in 2022, left mid gland nodule is relatively stable measuring 2.5 x 1.8 x 1.3 cm this time, and previously measuring 4.3 x 1.5 x 2.5 cm however comparing images measurement differences due to variability on measurement techniques overall relatively stable.  Denies any neck discomfort or dyspepsia or neck compressive symptoms.  No palpitation or heat intolerance.  Overall feeling good.  No hypo and hyperthyroid symptoms.  CLINICAL DATA:  Goiter. History of solitary left-sided thyroid  nodule previously biopsied in 2003.   EXAM: March 20, 2024  THYROID  ULTRASOUND   TECHNIQUE: Ultrasound examination of the thyroid  gland and adjacent soft tissues was performed.   COMPARISON:  Prior thyroid  ultrasound 02/14/2021   FINDINGS: Parenchymal Echotexture: Normal   Isthmus: 0.4 cm   Right lobe: 4.6 x 0.7 x 1.6 cm   Left lobe: 6.2 x 2.2 x 2.2 cm   _________________________________________________________   Estimated total number of nodules >/= 1 cm: 1   Number of spongiform nodules >/=  2 cm not described below (TR1): 0   Number of mixed cystic and solid nodules >/= 1.5 cm not described below (TR2): 0   _________________________________________________________   Nodule # 1: Ill-defined previously biopsied isoechoic solid nodule in the left mid gland measures approximately 2.5 x 1.8 x 1.3 cm which is considerably smaller compared to 4.3 x 1.5 x 2.5 cm previously. Involution over time is consistent with benignity.   No new nodules or suspicious features.   IMPRESSION: The previously biopsied nodule in the left mid to lower gland is now decreasing in size. Involution is consistent with  a benign process.   No new nodules or suspicious features.  REVIEW OF SYSTEMS:  As per history of present illness.   PAST MEDICAL HISTORY: Past Medical History:  Diagnosis Date   ALLERGIC RHINITIS    Anemia    Anxiety    Asthma    being worked up for asthma now. 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   MIGRAINE HEADACHE    PONV (postoperative nausea and vomiting)    RENAL CALCULUS, HX OF 06/25/2007, 02/2013   THYROID  NODULE     PAST SURGICAL HISTORY: Past Surgical History:  Procedure Laterality Date   ANKLE SURGERY Right 04/13/2018   BREAST BIOPSY Right 10/29/2013   BREAST LUMPECTOMY WITH RADIOACTIVE SEED LOCALIZATION Right 11/19/2013   Procedure: BREAST LUMPECTOMY WITH RADIOACTIVE SEED LOCALIZATION;  Surgeon: Elon CHRISTELLA Pacini, MD;  Location: [facility];  Service: General;  Laterality: Right;   CESAREAN SECTION     x's 2   EXTRACORPOREAL SHOCK WAVE LITHOTRIPSY Left 12/17/2021   Procedure: LEFTEXTRACORPOREAL SHOCK WAVE LITHOTRIPSY (ESWL);  Surgeon: Nieves Cough, MD;  Location[facility];  Service: Urology;  Laterality: Left;   Meninogioma Resection  07/25/09   pariatal area head-crainiotomy-plate   WISDOM TOOTH EXTRACTION      ALLERGIES: Allergies  Allergen Reactions   Ibuprofen  Rash   Buprenorphine Hcl Itching   Dust Mite Extract Itching   Gramineae Pollens Itching   Levofloxacin     Cannot sleep   Morphine And Codeine  Itching   Tramadol      Extreme sleeplessness/vomiting- can tolerate small dose per pt   Adhesive [Tape] Rash   Aleve [Naproxen Sodium] Rash   Latex Rash   Phenylephrine-Dm Anxiety    FAMILY HISTORY:  Family History  Problem Relation Age of Onset   Melanoma Mother        leg; dx 52s; excision only   Pancreatic cancer Father 86       neuroendocrine tumor   Prostate cancer Paternal Uncle        metastatic   Asthma Cousin    Diabetes Other    Hyperlipidemia Other    Stroke Other    Ulcerative colitis Other     SOCIAL HISTORY: Social History   Socioeconomic History   Marital status: Married    Spouse name: Not on file   Number of children: Not on file   Years of  education: Not on file   Highest education level: Not on file  Occupational History   Not on file  Tobacco Use   Smoking status: Never   Smokeless tobacco: Never   Tobacco comments:    Married, lives with spouse and 2 kids. Pt is a Runner, broadcasting/film/video  Substance and Sexual Activity   Alcohol use: No   Drug use: No   Sexual activity: Not on file  Other Topics Concern   Not on file  Social History Narrative   Not on file   Social Drivers of Health   Financial Resource Strain: Not on file  Food Insecurity: Low Risk  (05/12/2023)   Received from [facility]   Hunger Vital Sign    Within the past 12 months, you worried that your food would run out before you got money to buy more: Never true    Within the  past 12 months, the food you bought just didn't last and you didn't have money to get more. : Never true  Transportation Needs: No Transportation Needs (05/12/2023)   Received from [facility]    In the past 12 months, has lack of reliable transportation kept you from medical appointments, meetings, work or from getting things needed for daily living? : No  Physical Activity: Not on file  Stress: Not on file  Social Connections: Not on file    MEDICATIONS:  Current Outpatient Medications  Medication Sig Dispense Refill   acetaminophen  (TYLENOL ) 500 MG tablet Take 1 tablet (500 mg total) by mouth every 6 (six) hours as needed. 30 tablet 0   busPIRone (BUSPAR) 10 MG tablet Take by mouth daily.     cetirizine  (ZYRTEC ) 10 MG tablet Take 10 mg by mouth at bedtime.      cholecalciferol (VITAMIN D3) 25 MCG (1000 UNIT) tablet Take 1,000 Units by mouth daily.     diphenhydramine -acetaminophen  (TYLENOL  PM) 25-500 MG TABS tablet Take 1 tablet by mouth at bedtime as needed.     docusate sodium (COLACE) 250 MG capsule Take 250 mg by mouth daily as needed.     escitalopram (LEXAPRO) 20 MG tablet Take 20 mg by mouth daily.     ferrous sulfate 325 (65 FE) MG tablet Take 325 mg by  mouth See admin instructions. Take 1 tablet by mouth with breakfast one to two times per week.     meclizine  (ANTIVERT ) 12.5 MG tablet Take 1 tablet (12.5 mg total) by mouth 3 (three) times daily as needed for dizziness. 30 tablet 0   methocarbamol  (ROBAXIN ) 500 MG tablet Take 1 tablet (500 mg total) by mouth every 8 (eight) hours as needed for muscle spasms. 20 tablet 0   pantoprazole  (PROTONIX ) 40 MG tablet TAKE 1 TABLET BY MOUTH EVERY DAY 90 tablet 1   triamcinolone (KENALOG) 0.1 % paste APPLY TO ULCERS AFTER MEALS AND AT BEDTIME     No current facility-administered medications for this visit.    PHYSICAL EXAM: Vitals:   04/12/24 1312  BP: 112/60  Pulse: 77  Resp: 20  SpO2: 99%  Weight: 125 lb 6.4 oz (56.9 kg)  Height: 5' 3 (1.6 m)    Body mass index is 22.21 kg/m.    General: Well developed, well nourished female in no apparent distress.  HEENT: AT/[place], no external lesions. Hearing intact to the spoken word Eyes: Conjunctiva clear and no icterus. Neck: Trachea midline, neck supple with mild appreciable left thyromegaly or lymphadenopathy and left palpable thyroid  nodule 3 cm +, non tender, mobile Abdomen: Soft, non tender, non distended Neurologic: Alert, oriented, normal speech, deep tendon biceps reflexes normal,  no gross focal neurological deficit Extremities: No pedal pitting edema, no tremors of outstretched hands Skin: Warm, color good.  Psychiatric: Does not appear depressed or anxious  PERTINENT HISTORIC LABORATORY AND IMAGING STUDIES:  All pertinent laboratory results were reviewed. Please see HPI also for further details.   TSH  Date Value Ref Range Status  04/13/2023 0.62 0.35 - 5.50 uIU/mL Final  09/29/2021 0.97 0.35 - 5.50 uIU/mL Final  08/29/2020 1.25 0.35 - 4.50 uIU/mL Final     ASSESSMENT / PLAN  1. Left thyroid  nodule     -Patient was diagnosed with left thyroid  nodule in 2003 status post FNA at that time with benign cytology.  This nodule is  being monitored with intermittent/serial ultrasound with relatively stable size.  Left solid thyroid   nodule is measuring ~centimeter in maximal dimension. -Ultrasound thyroid  in March 19, 2024 with relatively stable left thyroid  nodule.  No other new nodules or suspicious features.  -She has no neck compressive symptoms.  She has remained euthyroid without thyroid  medication.  Plan: -Check thyroid  function test today. -Endocrinology follow-up in 2 years.  Patient is asked to call our clinic to make follow-up appointment around few months prior to fall 2027. - Will continue to monitor this nodule with serial ultrasound, plan for ultrasound thyroid  repeat in 3 to 4 years from now.  Diagnoses and all orders for this visit:  Left thyroid  nodule -     T4, free -     TSH     DISPOSITION Follow up in clinic in 2 years suggested.  All questions answered and patient verbalized understanding of the plan.  [place] Gracy Ehly, MD [facility] [address] [place], [address] [place], [place] [address] Phone # [phone]  At least part of this note was generated using voice recognition software. Inadvertent word errors may have occurred, which were not recognized during the proofreading process.

## 2024-04-12 NOTE — Patient Instructions (Signed)
 Follow up in Fall of 2027.

## 2024-04-13 ENCOUNTER — Ambulatory Visit: Payer: Self-pay | Admitting: Endocrinology

## 2024-04-13 ENCOUNTER — Ambulatory Visit: Payer: Self-pay | Admitting: Internal Medicine

## 2024-04-13 NOTE — Addendum Note (Signed)
 Addended by: Jaidon Ellery, [place] on: 04/13/2024 08:11 AM   Modules accepted: Orders

## 2024-04-24 ENCOUNTER — Encounter: Payer: Self-pay | Admitting: Internal Medicine

## 2024-04-24 ENCOUNTER — Ambulatory Visit: Admitting: Internal Medicine

## 2024-04-24 VITALS — BP 114/80 | HR 73 | Temp 98.6°F | Ht 63.0 in | Wt 124.0 lb

## 2024-04-24 DIAGNOSIS — M25512 Pain in left shoulder: Secondary | ICD-10-CM

## 2024-04-24 DIAGNOSIS — Z87898 Personal history of other specified conditions: Secondary | ICD-10-CM

## 2024-04-24 DIAGNOSIS — Z0000 Encounter for general adult medical examination without abnormal findings: Secondary | ICD-10-CM

## 2024-04-24 DIAGNOSIS — E041 Nontoxic single thyroid nodule: Secondary | ICD-10-CM

## 2024-04-24 DIAGNOSIS — K219 Gastro-esophageal reflux disease without esophagitis: Secondary | ICD-10-CM

## 2024-04-24 DIAGNOSIS — R42 Dizziness and giddiness: Secondary | ICD-10-CM | POA: Diagnosis not present

## 2024-04-24 DIAGNOSIS — F331 Major depressive disorder, recurrent, moderate: Secondary | ICD-10-CM

## 2024-04-24 DIAGNOSIS — F411 Generalized anxiety disorder: Secondary | ICD-10-CM

## 2024-04-24 MED ORDER — PANTOPRAZOLE SODIUM 40 MG PO TBEC: 40.0000 mg | DELAYED_RELEASE_TABLET | Freq: Two times a day (BID) | ORAL | 1 refills | Status: AC

## 2024-04-24 MED ORDER — PANTOPRAZOLE SODIUM 40 MG PO TBEC: 40.0000 mg | DELAYED_RELEASE_TABLET | Freq: Every day | ORAL | 1 refills | Status: DC

## 2024-04-24 MED ORDER — BUPROPION HCL ER (XL) 150 MG PO TB24: 150.0000 mg | ORAL_TABLET | Freq: Every day | ORAL | 1 refills | Status: AC

## 2024-04-24 NOTE — Progress Notes (Unsigned)
 Subjective:   Patient ID: Deborah Ortega, female    DOB: 07-24-1966, 58 y.o.   MRN: [id number]  The patient is here for physical. Pertinent topics discussed: Discussed the use of AI scribe software for clinical note transcription with the patient, who gave verbal consent to proceed.  History of Present Illness Deborah Ortega is a 58 year old female with depression and anxiety who presents with worsening mental health symptoms.  Since March 1st, she has experienced significant stress and worsening mental health symptoms following the death of her mother. Her anxiety and depression have been exacerbated by her daughter's mental health crises, including a serious suicide attempt and subsequent hospitalization. Her daughter's involvement with drugs and unemployment further contribute to her stress.  She has been taking Lexapro and Buspar for depression and anxiety. Lexapro, previously effective, has diminished in impact due to recent life events. She takes Buspar 10 mg once daily, but it does not significantly alleviate her anxiety. Clonazepam was recently prescribed by her gyn for sleep is taken at night for sleep, though it is sometimes ineffective.  She has a history of balance issues, having undergone balance therapy last summer without significant improvement. 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 She is concerned about the risk of falls and their potential impact on her health.  She is worried about developing dementia, as her mother and three of her grandparents had dementia. Her mother began showing symptoms in her seventies and passed away at 60. She avoids alcohol and smoking and has started taking supplements like magnesium and curcumin.  She attempted to engage in exercise by using hand weights but injured her arm, leading to a diagnosis of rotator cuff inflammation. She received a  cortisone shot and has not been able to perform the recommended exercises due to pain.  She works IT consultant adults with autism and finds work to be a helpful distraction. 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 Despite her busy schedule, she struggles with the emotional burden of her daughter's situation and her own health concerns.  PMH, [facility], social history reviewed and updated  Review of Systems  Constitutional: Negative.   HENT: Negative.    Eyes: Negative.   Respiratory:  Negative for cough, chest tightness and shortness of breath.   Cardiovascular:  Negative for chest pain, palpitations and leg swelling.  Gastrointestinal:  Negative for abdominal distention, abdominal pain, constipation, diarrhea, nausea and vomiting.  Musculoskeletal: Negative.   Skin: Negative.   Neurological: Negative.   Psychiatric/Behavioral:  Positive for decreased concentration, dysphoric mood and sleep disturbance. The patient is nervous/anxious.     Objective:  Physical Exam Constitutional:      Appearance: She is well-developed.  HENT:     Head: Normocephalic and atraumatic.  Cardiovascular:     Rate and Rhythm: Normal rate and regular rhythm.  Pulmonary:     Effort: Pulmonary effort is normal. No respiratory distress.     Breath sounds: Normal breath sounds. No wheezing or rales.  Abdominal:     General: Bowel sounds are normal. There is no distension.     Palpations: Abdomen is soft.     Tenderness: There is no abdominal tenderness.  Musculoskeletal:     Cervical back: Normal range of motion.  Skin:    General: Skin is warm and dry.  Neurological:     Mental Status: She is alert and oriented to person,  place, and time.     Coordination: Coordination normal.     Vitals:   04/24/24 1533  BP: 114/80  Pulse: 73  Temp: 98.6 F (37 C)  TempSrc: Oral  SpO2: 99%  Weight: 124 lb (56.2 kg)  Height: 5' 3 (1.6 m)    Assessment & Plan:

## 2024-04-24 NOTE — Patient Instructions (Addendum)
 We will stop the buspar and start wellbutrin . Wellbutrin  is 1 pill daily.  It is okay to stop buspar without reducing the dose.

## 2024-04-25 LAB — LIPID PANEL
Cholesterol: 215 mg/dL — ABNORMAL HIGH (ref 0–200)
HDL: 57.6 mg/dL (ref >39.00)
LDL Cholesterol: 139 mg/dL — ABNORMAL HIGH (ref 0–99)
NonHDL: 157.85
Total CHOL/HDL Ratio: 4
Triglycerides: 93 mg/dL (ref 0.0–149.0)
VLDL: 18.6 mg/dL (ref 0.0–40.0)

## 2024-04-25 LAB — COMPREHENSIVE METABOLIC PANEL WITH GFR
ALT: 15 U/L (ref 0–35)
AST: 14 U/L (ref 0–37)
Albumin: 4.5 g/dL (ref 3.5–5.2)
Alkaline Phosphatase: 66 U/L (ref 39–117)
BUN: 17 mg/dL (ref 6–23)
CO2: 29 meq/L (ref 19–32)
Calcium: 9.7 mg/dL (ref 8.4–10.5)
Chloride: 103 meq/L (ref 96–112)
Creatinine, Ser: 0.68 mg/dL (ref 0.40–1.20)
GFR: 97.36 mL/min (ref >60.00)
Glucose, Bld: 88 mg/dL (ref 70–99)
Potassium: 3.8 meq/L (ref 3.5–5.1)
Sodium: 140 meq/L (ref 135–145)
Total Bilirubin: 0.5 mg/dL (ref 0.2–1.2)
Total Protein: 7 g/dL (ref 6.0–8.3)

## 2024-04-25 LAB — CBC
HCT: 43.2 % (ref 36.0–46.0)
Hemoglobin: 14.5 g/dL (ref 12.0–15.0)
MCHC: 33.5 g/dL (ref 30.0–36.0)
MCV: 87.3 fl (ref 78.0–100.0)
Platelets: 320 K/uL (ref 150.0–400.0)
RBC: 4.95 Mil/uL (ref 3.87–5.11)
RDW: 13.1 % (ref 11.5–15.5)
WBC: 8.6 K/uL (ref 4.0–10.5)

## 2024-04-25 LAB — VITAMIN B12: Vitamin B-12: 159 pg/mL — ABNORMAL LOW (ref 211–911)

## 2024-04-25 LAB — VITAMIN D 25 HYDROXY (VIT D DEFICIENCY, FRACTURES): VITD: 69.35 ng/mL (ref 30.00–100.00)

## 2024-04-26 ENCOUNTER — Ambulatory Visit: Payer: Self-pay | Admitting: Internal Medicine

## 2024-04-26 DIAGNOSIS — F411 Generalized anxiety disorder: Secondary | ICD-10-CM | POA: Insufficient documentation

## 2024-04-26 DIAGNOSIS — F331 Major depressive disorder, recurrent, moderate: Secondary | ICD-10-CM | POA: Insufficient documentation

## 2024-04-26 NOTE — Assessment & Plan Note (Signed)
 She is under extreme stress. She is taking lexapro 20 mg daily which we will continue. She was started on buspar 10 mg daily some years ago by gyn. This is not helping and likely not dose effective. We will stop buspar and start wellbutrin  150 mg daily to help with mix of moderate depression and anxiety. Continue lexapro 20 mg daily and follow up 4 weeks.

## 2024-04-26 NOTE — Assessment & Plan Note (Signed)
 Recent thyroid  levels checked.

## 2024-04-26 NOTE — Assessment & Plan Note (Signed)
 Chonic and stable and she is aware to be careful with balance due to permanent change.

## 2024-04-26 NOTE — Assessment & Plan Note (Signed)
 Worsening symptoms. Will increase protonix  to 40 mg BID and follow up 4 weeks. If no improvement may need to see GI.

## 2024-04-26 NOTE — Assessment & Plan Note (Signed)
 Continues monitoring. No change in symptoms.

## 2024-04-26 NOTE — Assessment & Plan Note (Signed)
 Will stop buspar as this is not effective. Continue lexapro 20 mg daily and add wellbutrin  150 mg daily to help. Follow up 4 weeks. She is currently using clonazepam for sleep at night time and we discussed risk of cognitive problems and dependence with this long term and discussed trazodone as safest option. She does not want to make two changes at once. Can re-consider in 4 weeks.

## 2024-04-26 NOTE — Assessment & Plan Note (Signed)
 Flu shot yearly. Pneumonia due. Shingrix due. Tetanus due. Colonoscopy due. Mammogram up to date with gyn, pap smear up to date with gyn. Counseled about sun safety and mole surveillance. Counseled about the dangers of distracted driving. Given 10 year screening recommendations.

## 2024-04-26 NOTE — Assessment & Plan Note (Signed)
 She will continue to follow up with her specialist for this.

## 2024-05-04 ENCOUNTER — Ambulatory Visit

## 2024-05-04 DIAGNOSIS — E538 Deficiency of other specified B group vitamins: Secondary | ICD-10-CM | POA: Diagnosis not present

## 2024-05-04 MED ORDER — CYANOCOBALAMIN 1000 MCG/ML IJ SOLN
1000.0000 ug | Freq: Once | INTRAMUSCULAR | Status: AC
  Administered 2024-05-04: 1000 ug via INTRAMUSCULAR

## 2024-05-04 NOTE — Progress Notes (Signed)
Pt here for monthly B12 injection per   B12 1000mcg given IM and pt tolerated injection well.    

## 2024-05-09 NOTE — Progress Notes (Unsigned)
 Ben Jackson D[facility] [address] [place] [address] Phone: [phone]   Assessment and Plan:     1.  Chronic pain of left shoulder (Primary) 2. 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  Mild improvement after subacromial CSI at previous office visit on 04/12/2024.  Patient does have small humeral head spur that may be contributing to symptoms. - Patient did not start HEP due to discomfort.  Recommend starting HEP at this time - Patient does not tolerate NSAIDs due to side effect of rash and does not tolerate p.o. prednisone  due to side effect of insomnia  -Offered physical therapy.  Patient is not interested at this time, but could consider future office visit - Patient elected for intra-articular glenohumeral CSI.  Tolerated well per note below.  Procedure: Ultrasound Guided Glenohumeral Joint Injection Side: Left Diagnosis: Left shoulder pain US  Indication:  - accuracy is paramount for diagnosis - to ensure therapeutic efficacy or procedural success - to reduce procedural risk  After explaining the procedure, viable alternatives, risks, and answering any questions, consent was given verbally. The site was cleaned with chlorhexidine  prep. An ultrasound transducer was placed on the posterior shoulder.  The posterior capsule, labrum, and infraspinatus were identified.  A steroid injection was performed under ultrasound guidance with sterile technique using  2ml of 1% lidocaine  without epinephrine  and 40 mg of triamcinolone  (KENALOG) 40mg /ml. This was well tolerated and resulted in  relief.  Needle was removed and dressing placed and post injection instructions were given including  a discussion of likely return of pain today after the anesthetic wears off (with the  possibility of worsened pain) until the steroid starts to work in 1-3 days.   Pt was advised to call or return to clinic if these symptoms worsen or fail to improve as anticipated.   Images permanently stored.   Pertinent previous records reviewed include none   Follow Up: 3 to 4 weeks for reevaluation.  If no improvement or worsening of symptoms, would consider MRI versus physical therapy   Subjective:   I, Donnamarie Shankles, am serving as a Neurosurgeon for Doctor Morene Mace   Chief Complaint: left shoulder pain   HPI:    04/12/2024 Patient is a 57 year old female with left shoulder pain. Patient states pain for a couple of months. Decreased ROM. She was using a 5lb weight doing arm workout. No true injury. Pain radiates down her arm. Isnt able to sleep through the night. Intermittent tylenol  for the pain doesn't know if it helps.    05/10/2024 Patient states she still has pain but is a little better    Relevant Historical Information: None pertinent  Additional pertinent review of systems negative.   Current Outpatient Medications:    acetaminophen  (TYLENOL ) 500 MG tablet, Take 1 tablet (500 mg total) by mouth every 6 (six) hours as needed., Disp: 30 tablet, Rfl: 0   buPROPion  (WELLBUTRIN  XL) 150 MG 24 hr tablet, Take 1 tablet (150 mg total) by mouth daily., Disp: 90 tablet, Rfl: 1   cetirizine  (ZYRTEC ) 10 MG tablet, Take 10 mg by mouth at bedtime. , Disp: , Rfl:    cholecalciferol (VITAMIN D3) 25 MCG (1000 UNIT) tablet, Take 1,000 Units by mouth daily. (Patient not taking: Reported on 04/24/2024), Disp: , Rfl:    diphenhydramine -acetaminophen  (TYLENOL  PM) 25-500 MG TABS  tablet, Take 1 tablet by mouth at bedtime as needed., Disp: , Rfl:    docusate sodium (COLACE) 250 MG capsule, Take 250 mg by mouth daily as needed., Disp: , Rfl:    escitalopram (LEXAPRO) 20 MG tablet, Take 20 mg by mouth daily., Disp: , Rfl:    ferrous sulfate 325 (65 FE) MG tablet, Take 325 mg by mouth See admin  instructions. Take 1 tablet by mouth with breakfast one to two times per week. (Patient not taking: Reported on 04/24/2024), Disp: , Rfl:    KLONOPIN 0.5 MG tablet, Take 0.5 mg by mouth at bedtime., Disp: , Rfl:    meclizine  (ANTIVERT ) 12.5 MG tablet, Take 1 tablet (12.5 mg total) by mouth 3 (three) times daily as needed for dizziness. (Patient not taking: Reported on 04/24/2024), Disp: 30 tablet, Rfl: 0   methocarbamol  (ROBAXIN ) 500 MG tablet, Take 1 tablet (500 mg total) by mouth every 8 (eight) hours as needed for muscle spasms. (Patient not taking: Reported on 04/24/2024), Disp: 20 tablet, Rfl: 0   pantoprazole  (PROTONIX ) 40 MG tablet, Take 1 tablet (40 mg total) by mouth 2 (two) times daily before a meal., Disp: 180 tablet, Rfl: 1   triamcinolone  (KENALOG) 0.1 % paste, APPLY TO ULCERS AFTER MEALS AND AT BEDTIME, Disp: , Rfl:    Objective:     Vitals:   05/10/24 1547  Pulse: 75  SpO2: 100%  Weight: 124 lb (56.2 kg)  Height: 5' 3 (1.6 m)      Body mass index is 21.97 kg/m.    Physical Exam:    Gen: Appears well, nad, nontoxic and pleasant Neuro:sensation intact, strength is 5/5 with df/pf/inv/ev, muscle tone wnl Skin: no suspicious lesion or defmority Psych: A&O, appropriate mood and affect   Left shoulder:  No deformity, swelling or muscle wasting No scapular winging FF 160 with painful arc, abd 160 with painful arc, int 15, ext 80 NTTP over the [place], clavicle, ac, coracoid, biceps groove, humerus, deltoid, trapezius, cervical spine Positive neer, hawkins, empty can, obriens, crossarm, Negative subscap liftoff, speeds Neg ant drawer, sulcus sign, apprehension Negative Spurling's test bilat FROM of neck    Electronically signed by:  Odis Mace [facility] 3:58 PM 05/10/24

## 2024-05-10 ENCOUNTER — Other Ambulatory Visit: Payer: Self-pay

## 2024-05-10 ENCOUNTER — Ambulatory Visit: Admitting: Sports Medicine

## 2024-05-10 VITALS — HR 75 | Ht 63.0 in | Wt 124.0 lb

## 2024-05-10 DIAGNOSIS — M25512 Pain in left shoulder: Secondary | ICD-10-CM

## 2024-05-10 DIAGNOSIS — G8929 Other chronic pain: Secondary | ICD-10-CM | POA: Diagnosis not present

## 2024-05-10 DIAGNOSIS — M7552 Bursitis of left shoulder: Secondary | ICD-10-CM

## 2024-05-10 NOTE — Patient Instructions (Signed)
 3-4 week follow up   Start HEP

## 2024-05-11 ENCOUNTER — Ambulatory Visit: Admitting: Radiology

## 2024-05-11 DIAGNOSIS — E538 Deficiency of other specified B group vitamins: Secondary | ICD-10-CM

## 2024-05-11 MED ORDER — CYANOCOBALAMIN 1000 MCG/ML IJ SOLN
1000.0000 ug | Freq: Once | INTRAMUSCULAR | Status: AC
  Administered 2024-05-11: 1000 ug via INTRAMUSCULAR

## 2024-05-11 NOTE — Progress Notes (Signed)
 Patient here for B12 injections. Patient tolerated well with no complications.

## 2024-05-22 ENCOUNTER — Ambulatory Visit: Admitting: Internal Medicine

## 2024-05-30 NOTE — Progress Notes (Unsigned)
 Deborah Ortega D[facility] [address] [place] [address] Phone: [phone]   Assessment and Plan:     1. Chronic left shoulder pain (Primary) 2. 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  Consistent with resolving flare of mild degenerative changes with small humeral head spur on x-ray imaging. - Patient had only mild relief with subacromial CSI on 04/12/2024, so if symptoms returned identically, would consider repeat glenohumeral CSI - Use Tylenol  500 to 1000 mg tablets 2-3 times a day for day-to-day pain relief - Continue HEP.  Offered physical therapy.  Patient is not interested in physical therapy at this time, but could be considered at future visits - Patient does not tolerate NSAIDs due to side effect of rash and does not tolerate prednisone  due to side effect of insomnia    Pertinent previous records reviewed include none   Follow Up: As needed if no improvement or worsening of symptoms.  Could consider repeat glenohumeral CSI versus physical therapy versus advanced imaging   Subjective:   I, Deborah Ortega, am serving as a Neurosurgeon for Doctor Morene Mace   Chief Complaint: left shoulder pain   HPI:    04/12/2024 Patient is a 58 year old female with left shoulder pain. Patient states pain for a couple of months. Decreased ROM. She was using a 5lb weight doing arm workout. No true injury. Pain radiates down her arm. Isnt able to sleep through the night. Intermittent tylenol  for the pain doesn't know if it helps.    05/10/2024 Patient states she still has pain but is a little better   05/31/2024 Patient states she is much better. She has little tweaks when doing ROM but not bad    Relevant Historical Information: None pertinent    Additional pertinent review of systems  negative.   Current Outpatient Medications:    acetaminophen  (TYLENOL ) 500 MG tablet, Take 1 tablet (500 mg total) by mouth every 6 (six) hours as needed., Disp: 30 tablet, Rfl: 0   buPROPion  (WELLBUTRIN  XL) 150 MG 24 hr tablet, Take 1 tablet (150 mg total) by mouth daily., Disp: 90 tablet, Rfl: 1   cetirizine  (ZYRTEC ) 10 MG tablet, Take 10 mg by mouth at bedtime. , Disp: , Rfl:    cholecalciferol (VITAMIN D3) 25 MCG (1000 UNIT) tablet, Take 1,000 Units by mouth daily. (Patient not taking: Reported on 04/24/2024), Disp: , Rfl:    diphenhydramine -acetaminophen  (TYLENOL  PM) 25-500 MG TABS tablet, Take 1 tablet by mouth at bedtime as needed., Disp: , Rfl:    docusate sodium (COLACE) 250 MG capsule, Take 250 mg by mouth daily as needed., Disp: , Rfl:    escitalopram (LEXAPRO) 20 MG tablet, Take 20 mg by mouth daily., Disp: , Rfl:    ferrous sulfate 325 (65 FE) MG tablet, Take 325 mg by mouth See admin instructions. Take 1 tablet by mouth with breakfast one to two times per week. (Patient not taking: Reported on 04/24/2024), Disp: , Rfl:    KLONOPIN 0.5 MG tablet, Take 0.5 mg by mouth at bedtime., Disp: , Rfl:    meclizine  (ANTIVERT ) 12.5 MG tablet, Take 1 tablet (12.5 mg total) by mouth 3 (three) times daily as needed for dizziness. (Patient not taking: Reported on 04/24/2024), Disp: 30 tablet, Rfl: 0   methocarbamol  (ROBAXIN ) 500 MG tablet, Take 1 tablet (500 mg total) by mouth every 8 (  eight) hours as needed for muscle spasms. (Patient not taking: Reported on 04/24/2024), Disp: 20 tablet, Rfl: 0   pantoprazole  (PROTONIX ) 40 MG tablet, Take 1 tablet (40 mg total) by mouth 2 (two) times daily before a meal., Disp: 180 tablet, Rfl: 1   triamcinolone  (KENALOG) 0.1 % paste, APPLY TO ULCERS AFTER MEALS AND AT BEDTIME, Disp: , Rfl:    Objective:     Vitals:   05/31/24 1551  Pulse: 74  SpO2: 99%  Weight: 124 lb (56.2 kg)  Height: 5' 3 (1.6 m)      Body mass index is 21.97 kg/m.    Physical Exam:     Gen: Appears well, nad, nontoxic and pleasant Neuro:sensation intact, strength is 5/5 with df/pf/inv/ev, muscle tone wnl Skin: no suspicious lesion or defmority Psych: A&O, appropriate mood and affect   Left shoulder:  No deformity, swelling or muscle wasting No scapular winging FF 160 with painful arc, abd 160 with painful arc, int 15, ext 80 NTTP over the [place], clavicle, ac, coracoid, biceps groove, humerus, deltoid, trapezius, cervical spine Positive neer, hawkins, empty can, obriens, crossarm, Negative subscap liftoff, speeds Neg ant drawer, sulcus sign, apprehension Negative Spurling's test bilat FROM of neck     Electronically signed by:  Odis Mace [facility] 3:59 PM 05/31/24

## 2024-05-31 ENCOUNTER — Ambulatory Visit: Admitting: Sports Medicine

## 2024-05-31 VITALS — HR 74 | Ht 63.0 in | Wt 124.0 lb

## 2024-05-31 DIAGNOSIS — M25512 Pain in left shoulder: Secondary | ICD-10-CM | POA: Diagnosis not present

## 2024-05-31 DIAGNOSIS — M7552 Bursitis of left shoulder: Secondary | ICD-10-CM

## 2024-05-31 DIAGNOSIS — G8929 Other chronic pain: Secondary | ICD-10-CM | POA: Diagnosis not present

## 2024-06-11 ENCOUNTER — Ambulatory Visit: Admitting: Internal Medicine

## 2024-06-11 ENCOUNTER — Encounter: Payer: Self-pay | Admitting: Internal Medicine

## 2024-06-11 VITALS — BP 118/80 | HR 68 | Temp 98.8°F | Ht 63.0 in | Wt 125.6 lb

## 2024-06-11 DIAGNOSIS — F331 Major depressive disorder, recurrent, moderate: Secondary | ICD-10-CM

## 2024-06-11 DIAGNOSIS — F411 Generalized anxiety disorder: Secondary | ICD-10-CM

## 2024-06-11 NOTE — Progress Notes (Signed)
   Subjective:   Patient ID: Deborah Ortega, female    DOB: Dec 04, 1965, 58 y.o.   MRN: [id number]  Discussed the use of AI scribe software for clinical note transcription with the patient, who gave verbal consent to proceed.  History of Present Illness Deborah Ortega is a 58 year old female who presents for follow-up regarding her mental health and medication management.  She feels better than during her last visit, with an improved emotional state and reduced crying. Positive changes in her family situation, including her daughter's progress in a day program and her father's stable health condition, have contributed to this improvement.  Her daughter, previously involved with a drug dealer, is now attending a day program four out of five days a week and participating in therapy. Her daughter tested clean for substances. The day program is set to last for ten weeks, ending around December. She expresses concern about the transition after the program ends, especially with her daughter turning 33 in 11-11-2023 and losing insurance coverage.  Her father has pancreatic cancer and a lung condition, but recent CT scans show no changes in the past year, which she finds reassuring. However, he is experiencing significant depression following the death of her mother in 11-Nov-2023, which has been emotionally challenging for both of them.  She is currently taking Wellbutrin .      06/11/2024    4:03 PM 04/24/2024    3:47 PM 01/07/2023    3:33 PM  PHQ9 SCORE ONLY  PHQ-9 Total Score 7 17 0      Data saved with a previous flowsheet row definition      Review of Systems  Objective:  Physical Exam  There were no vitals filed for this visit.  Assessment and Plan Assessment & Plan Major depressive disorder, single episode, unspecified   Depressive symptoms have improved due to positive life events, though emotional moments persist related to her father's depression. Continue Wellbutrin  at the current  dose.  Anxiety disorder, unspecified   Continue the current medication regimen.

## 2024-06-14 NOTE — Assessment & Plan Note (Signed)
Continue the current medication regimen

## 2024-06-14 NOTE — Assessment & Plan Note (Signed)
 Depressive symptoms have improved due to positive life events, though emotional moments persist. Continue Wellbutrin  at the current dose.

## 2024-06-15 ENCOUNTER — Ambulatory Visit

## 2024-08-14 ENCOUNTER — Other Ambulatory Visit

## 2024-08-14 LAB — TSH: TSH: 0.68 m[IU]/L (ref 0.40–4.50)

## 2024-08-14 LAB — T4, FREE: Free T4: 1.2 ng/dL (ref 0.8–1.8)

## 2025-04-30 ENCOUNTER — Encounter: Admitting: Internal Medicine

## 2026-04-14 ENCOUNTER — Ambulatory Visit: Admitting: Endocrinology
